# Patient Record
Sex: Male | Born: 1980 | Race: Black or African American | Hispanic: No | Marital: Single | Smoking: Never smoker
Health system: Southern US, Community
[De-identification: ages and names within clinical notes are randomized; demographics above are authoritative.]

## PROBLEM LIST (undated history)

## (undated) DIAGNOSIS — I480 Paroxysmal atrial fibrillation: Secondary | ICD-10-CM

## (undated) HISTORY — PX: ANTERIOR CRUCIATE LIGAMENT REPAIR: SHX115

---
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
## 2002-10-14 HISTORY — PX: ANTERIOR CRUCIATE LIGAMENT REPAIR: SHX115

## 2004-11-27 ENCOUNTER — Ambulatory Visit (HOSPITAL_BASED_OUTPATIENT_CLINIC_OR_DEPARTMENT_OTHER): Admission: RE | Admit: 2004-11-27 | Discharge: 2004-11-27 | Payer: Self-pay | Admitting: Urology

## 2004-11-30 ENCOUNTER — Ambulatory Visit (HOSPITAL_BASED_OUTPATIENT_CLINIC_OR_DEPARTMENT_OTHER): Admission: RE | Admit: 2004-11-30 | Discharge: 2004-11-30 | Payer: Self-pay | Admitting: Orthopedic Surgery

## 2012-11-30 ENCOUNTER — Encounter (HOSPITAL_COMMUNITY): Payer: Self-pay | Admitting: *Deleted

## 2012-11-30 ENCOUNTER — Emergency Department (HOSPITAL_COMMUNITY)
Admission: EM | Admit: 2012-11-30 | Discharge: 2012-11-30 | Disposition: A | Discharge status: Home / Self Care | Payer: Self-pay | Attending: Emergency Medicine | Admitting: Emergency Medicine

## 2012-11-30 ENCOUNTER — Emergency Department (HOSPITAL_COMMUNITY): Payer: Self-pay

## 2012-11-30 DIAGNOSIS — S91109A Unspecified open wound of unspecified toe(s) without damage to nail, initial encounter: Secondary | ICD-10-CM | POA: Insufficient documentation

## 2012-11-30 DIAGNOSIS — S97109A Crushing injury of unspecified toe(s), initial encounter: Secondary | ICD-10-CM | POA: Insufficient documentation

## 2012-11-30 DIAGNOSIS — W208XXA Other cause of strike by thrown, projected or falling object, initial encounter: Secondary | ICD-10-CM | POA: Insufficient documentation

## 2012-11-30 DIAGNOSIS — Y93B9 Activity, other involving muscle strengthening exercises: Secondary | ICD-10-CM | POA: Insufficient documentation

## 2012-11-30 DIAGNOSIS — S91209A Unspecified open wound of unspecified toe(s) with damage to nail, initial encounter: Secondary | ICD-10-CM

## 2012-11-30 DIAGNOSIS — Y9289 Other specified places as the place of occurrence of the external cause: Secondary | ICD-10-CM | POA: Insufficient documentation

## 2012-11-30 DIAGNOSIS — Z23 Encounter for immunization: Secondary | ICD-10-CM | POA: Insufficient documentation

## 2012-11-30 MED ORDER — TETANUS-DIPHTH-ACELL PERTUSSIS 5-2.5-18.5 LF-MCG/0.5 IM SUSP
0.5000 mL | Freq: Once | INTRAMUSCULAR | Status: AC
  Administered 2012-11-30: 0.5 mL via INTRAMUSCULAR
  Filled 2012-11-30: qty 0.5

## 2012-11-30 NOTE — ED Notes (Signed)
Patient is alert and orientedx4.  Patient was explained discharge instructions and they understood them with no questions.   

## 2012-11-30 NOTE — ED Notes (Signed)
Patient says he was at the gym working out and he dropped a weight on his foot.  The toe second to the great toe on the left foot is bleeding and the patient pulled the toenail off.  Patient has toenail with him.

## 2012-11-30 NOTE — ED Provider Notes (Signed)
History  This chart was scribed for Cornelia Copa, a non-physician practitioner working with Gwyneth Sprout, MD by Lewanda Rife, ED Scribe. This patient was seen in room [facility] and the patient's care was started at 2110.   CSN: [id number]  Arrival date & time 11/30/12  2050   First MD Initiated Contact with Patient 11/30/12 2103      Chief Complaint  Patient presents with  . Toe Injury    HPI Evan Vargas is a 32 y.o. male who presents to the Emergency Department complaining of second toe of left foot injury onset 30 minutes prior to arrival after dropping a weight on foot at the gym. Pt reports mild, constant throbbing pain to left second toe. Pt denies taking any medications at home to treat pain. Pt reports unknown tetanus status.   History reviewed. No pertinent past medical history.  Past Surgical History  Procedure Laterality Date  . Anterior cruciate ligament repair      left    History reviewed. No pertinent family history.  History  Substance Use Topics  . Smoking status: Never Smoker   . Smokeless tobacco: Not on file  . Alcohol Use: No      Review of Systems  All other systems reviewed and are negative.   A complete 10 system review of systems was obtained and all systems are negative except as noted in the HPI and PMH.   Allergies  Review of patient's allergies indicates no known allergies.  Home Medications  No current outpatient prescriptions on file.  BP 134/50  Pulse 101  Temp(Src) 98.9 F (37.2 C) (Oral)  Resp 16  SpO2 100%  Physical Exam  Nursing note and vitals reviewed. Constitutional: He is oriented to person, place, and time. He appears well-developed and well-nourished. No distress.  HENT:  Head: Normocephalic and atraumatic.  Eyes: Conjunctivae and EOM are normal.  Neck: Normal range of motion. Neck supple.  Cardiovascular: Normal rate, normal heart sounds and intact distal pulses.   Pulmonary/Chest: Effort  normal and breath sounds normal.  Musculoskeletal: Normal range of motion. He exhibits no edema and no tenderness.  Second toe of left foot with complete avulsion of the nail and mild bleeding. Normal sensation and ROM. No gross deformity.   Neurological: He is alert and oriented to person, place, and time. He has normal strength. No sensory deficit.  Skin: Skin is warm and dry. No rash noted.  Psychiatric: He has a normal mood and affect.    ED Course  Procedures   Medications  TDaP (BOOSTRIX) injection 0.5 mL (not administered)    Dg Toe 2nd Left  11/30/2012  *RADIOLOGY REPORT*  Clinical Data: Second toe injury.  LEFT SECOND TOE  Comparison: None.  Findings: There is soft tissue irregularity distally consistent with a laceration.  No foreign body, acute fracture or dislocation is identified.  IMPRESSION: No acute osseous findings.   Original Report Authenticated By: Carey Bullocks, M.D.      1. Nail avulsion of toe   2. Crush injury, toe       MDM  Patient seen and evaluated. Patient appears comfortable in no acute distress. There is obvious avulsion injury of the nail of the left second toe. No gross deformity.  Wound care and bandage provided. No fractures on x-ray. Patient informed he may not have regrowth of nail.    I personally performed the services described in this documentation, which was scribed in my presence. The recorded information has been reviewed  and is accurate.    Angus Seller, [place] 11/30/12 [phone]

## 2012-11-30 NOTE — ED Provider Notes (Signed)
Medical screening examination/treatment/procedure(s) were performed by non-physician practitioner and as supervising physician I was immediately available for consultation/collaboration.   Gwyneth Sprout, MD 11/30/12 2317

## 2012-11-30 NOTE — Progress Notes (Signed)
Orthopedic Tech Progress Note Patient Details:  Evan Vargas 1981/07/12 [id number]  Ortho Devices Type of Ortho Device: Postop shoe/boot Ortho Device/Splint Location: (L) LE Ortho Device/Splint Interventions: Application   Jennye Moccasin 11/30/2012, 10:15 PM

## 2012-11-30 NOTE — ED Notes (Signed)
Pt states dropped weight on left foot. Pt states that the toe nail next to his great toe on his left foot is missing, pt has it with him.

## 2018-01-02 ENCOUNTER — Encounter (HOSPITAL_COMMUNITY): Payer: Self-pay | Admitting: Emergency Medicine

## 2018-01-02 ENCOUNTER — Emergency Department (HOSPITAL_COMMUNITY): Payer: Self-pay

## 2018-01-02 ENCOUNTER — Inpatient Hospital Stay (HOSPITAL_COMMUNITY)
Admission: EM | Admit: 2018-01-02 | Discharge: 2018-01-05 | DRG: 193 | Disposition: A | Discharge status: Home / Self Care | Payer: Self-pay | Attending: Family Medicine | Admitting: Family Medicine

## 2018-01-02 ENCOUNTER — Inpatient Hospital Stay (HOSPITAL_COMMUNITY): Payer: Self-pay

## 2018-01-02 ENCOUNTER — Other Ambulatory Visit: Payer: Self-pay

## 2018-01-02 DIAGNOSIS — R0682 Tachypnea, not elsewhere classified: Secondary | ICD-10-CM | POA: Diagnosis present

## 2018-01-02 DIAGNOSIS — J9601 Acute respiratory failure with hypoxia: Secondary | ICD-10-CM | POA: Diagnosis present

## 2018-01-02 DIAGNOSIS — I313 Pericardial effusion (noninflammatory): Secondary | ICD-10-CM | POA: Diagnosis present

## 2018-01-02 DIAGNOSIS — J9602 Acute respiratory failure with hypercapnia: Secondary | ICD-10-CM | POA: Diagnosis present

## 2018-01-02 DIAGNOSIS — J810 Acute pulmonary edema: Secondary | ICD-10-CM

## 2018-01-02 DIAGNOSIS — I361 Nonrheumatic tricuspid (valve) insufficiency: Secondary | ICD-10-CM

## 2018-01-02 DIAGNOSIS — J9621 Acute and chronic respiratory failure with hypoxia: Secondary | ICD-10-CM | POA: Diagnosis present

## 2018-01-02 DIAGNOSIS — I340 Nonrheumatic mitral (valve) insufficiency: Secondary | ICD-10-CM

## 2018-01-02 DIAGNOSIS — R000 Tachycardia, unspecified: Secondary | ICD-10-CM | POA: Diagnosis present

## 2018-01-02 DIAGNOSIS — R0602 Shortness of breath: Secondary | ICD-10-CM | POA: Diagnosis present

## 2018-01-02 DIAGNOSIS — Z79899 Other long term (current) drug therapy: Secondary | ICD-10-CM

## 2018-01-02 DIAGNOSIS — I4892 Unspecified atrial flutter: Secondary | ICD-10-CM | POA: Diagnosis present

## 2018-01-02 DIAGNOSIS — J189 Pneumonia, unspecified organism: Principal | ICD-10-CM | POA: Diagnosis present

## 2018-01-02 DIAGNOSIS — I5031 Acute diastolic (congestive) heart failure: Secondary | ICD-10-CM | POA: Diagnosis present

## 2018-01-02 DIAGNOSIS — R7303 Prediabetes: Secondary | ICD-10-CM | POA: Diagnosis present

## 2018-01-02 DIAGNOSIS — R748 Abnormal levels of other serum enzymes: Secondary | ICD-10-CM

## 2018-01-02 LAB — TROPONIN I
Troponin I: 0.1 ng/mL (ref <0.03)
Troponin I: 0.09 ng/mL (ref <0.03)

## 2018-01-02 LAB — COMPREHENSIVE METABOLIC PANEL WITH GFR
ALT: 308 U/L — ABNORMAL HIGH (ref 17–63)
AST: 165 U/L — ABNORMAL HIGH (ref 15–41)
Albumin: 2.7 g/dL — ABNORMAL LOW (ref 3.5–5.0)
Alkaline Phosphatase: 68 U/L (ref 38–126)
Anion gap: 11 (ref 5–15)
BUN: 20 mg/dL (ref 6–20)
CO2: 23 mmol/L (ref 22–32)
Calcium: 7.7 mg/dL — ABNORMAL LOW (ref 8.9–10.3)
Chloride: 104 mmol/L (ref 101–111)
Creatinine, Ser: 1.24 mg/dL (ref 0.61–1.24)
GFR calc Af Amer: >60 mL/min
GFR calc non Af Amer: >60 mL/min
Glucose, Bld: 129 mg/dL — ABNORMAL HIGH (ref 65–99)
Potassium: 4.2 mmol/L (ref 3.5–5.1)
Sodium: 138 mmol/L (ref 135–145)
Total Bilirubin: 0.3 mg/dL (ref 0.3–1.2)
Total Protein: 5.7 g/dL — ABNORMAL LOW (ref 6.5–8.1)

## 2018-01-02 LAB — URINALYSIS, ROUTINE W REFLEX MICROSCOPIC
Bilirubin Urine: NEGATIVE
Glucose, UA: NEGATIVE mg/dL
Hgb urine dipstick: NEGATIVE
Ketones, ur: NEGATIVE mg/dL
Leukocytes, UA: NEGATIVE
NITRITE: NEGATIVE
PROTEIN: NEGATIVE mg/dL
SPECIFIC GRAVITY, URINE: 1.011 (ref 1.005–1.030)
pH: 7 (ref 5.0–8.0)

## 2018-01-02 LAB — LACTIC ACID, PLASMA: LACTIC ACID, VENOUS: 1.8 mmol/L (ref 0.5–1.9)

## 2018-01-02 LAB — CBC WITH DIFFERENTIAL/PLATELET
Basophils Absolute: 0 K/uL (ref 0.0–0.1)
Basophils Relative: 0 %
Eosinophils Absolute: 0 K/uL (ref 0.0–0.7)
Eosinophils Relative: 0 %
HCT: 38.2 % — ABNORMAL LOW (ref 39.0–52.0)
Hemoglobin: 12.8 g/dL — ABNORMAL LOW (ref 13.0–17.0)
Lymphocytes Relative: 14 %
Lymphs Abs: 1.1 K/uL (ref 0.7–4.0)
MCH: 30.8 pg (ref 26.0–34.0)
MCHC: 33.5 g/dL (ref 30.0–36.0)
MCV: 91.8 fL (ref 78.0–100.0)
Monocytes Absolute: 0.8 K/uL (ref 0.1–1.0)
Monocytes Relative: 10 %
Neutro Abs: 6 K/uL (ref 1.7–7.7)
Neutrophils Relative %: 76 %
Platelets: 188 K/uL (ref 150–400)
RBC: 4.16 MIL/uL — ABNORMAL LOW (ref 4.22–5.81)
RDW: 15 % (ref 11.5–15.5)
WBC: 7.8 K/uL (ref 4.0–10.5)

## 2018-01-02 LAB — I-STAT CG4 LACTIC ACID, ED
Lactic Acid, Venous: 3.05 mmol/L (ref 0.5–1.9)
Lactic Acid, Venous: 3.22 mmol/L (ref 0.5–1.9)

## 2018-01-02 LAB — CALCIUM: Calcium: 7.7 mg/dL — ABNORMAL LOW (ref 8.9–10.3)

## 2018-01-02 LAB — I-STAT ARTERIAL BLOOD GAS, ED
Acid-Base Excess: 5 mmol/L — ABNORMAL HIGH (ref 0.0–2.0)
Bicarbonate: 25.4 mmol/L (ref 20.0–28.0)
O2 Saturation: 99 %
TCO2: 26 mmol/L (ref 22–32)
pCO2 arterial: 26.3 mmHg — ABNORMAL LOW (ref 32.0–48.0)
pH, Arterial: 7.593 — ABNORMAL HIGH (ref 7.350–7.450)
pO2, Arterial: 121 mmHg — ABNORMAL HIGH (ref 83.0–108.0)

## 2018-01-02 LAB — MAGNESIUM: MAGNESIUM: 2.2 mg/dL (ref 1.7–2.4)

## 2018-01-02 LAB — PHOSPHORUS: PHOSPHORUS: 3 mg/dL (ref 2.5–4.6)

## 2018-01-02 LAB — CBC
HCT: 37.6 % — ABNORMAL LOW (ref 39.0–52.0)
HEMOGLOBIN: 12.5 g/dL — AB (ref 13.0–17.0)
MCH: 30.2 pg (ref 26.0–34.0)
MCHC: 33.2 g/dL (ref 30.0–36.0)
MCV: 90.8 fL (ref 78.0–100.0)
Platelets: 209 10*3/uL (ref 150–400)
RBC: 4.14 MIL/uL — AB (ref 4.22–5.81)
RDW: 14.9 % (ref 11.5–15.5)
WBC: 6.5 10*3/uL (ref 4.0–10.5)

## 2018-01-02 LAB — CREATININE, SERUM: Creatinine, Ser: 1.22 mg/dL (ref 0.61–1.24)

## 2018-01-02 LAB — ECHOCARDIOGRAM COMPLETE
HEIGHTINCHES: 68 in
WEIGHTICAEL: 2720 [oz_av]

## 2018-01-02 LAB — RAPID URINE DRUG SCREEN, HOSP PERFORMED
Amphetamines: NOT DETECTED
Barbiturates: NOT DETECTED
Benzodiazepines: NOT DETECTED
Cocaine: NOT DETECTED
Opiates: NOT DETECTED
Tetrahydrocannabinol: NOT DETECTED

## 2018-01-02 LAB — BRAIN NATRIURETIC PEPTIDE
B Natriuretic Peptide: 1008.6 pg/mL — ABNORMAL HIGH (ref 0.0–100.0)
B Natriuretic Peptide: 1059.7 pg/mL — ABNORMAL HIGH (ref 0.0–100.0)

## 2018-01-02 LAB — TSH: TSH: 0.511 u[IU]/mL (ref 0.350–4.500)

## 2018-01-02 LAB — I-STAT TROPONIN, ED: Troponin i, poc: 0.07 ng/mL (ref 0.00–0.08)

## 2018-01-02 LAB — INFLUENZA PANEL BY PCR (TYPE A & B)
INFLBPCR: NEGATIVE
Influenza A By PCR: NEGATIVE

## 2018-01-02 LAB — CK: CK TOTAL: 355 U/L (ref 49–397)

## 2018-01-02 LAB — CK TOTAL AND CKMB (NOT AT ARMC)
CK TOTAL: 335 U/L (ref 49–397)
CK, MB: 5.2 ng/mL — ABNORMAL HIGH (ref 0.5–5.0)
Relative Index: 1.6 (ref 0.0–2.5)

## 2018-01-02 LAB — HEMOGLOBIN A1C
Hgb A1c MFr Bld: 5.9 % — ABNORMAL HIGH (ref 4.8–5.6)
MEAN PLASMA GLUCOSE: 122.63 mg/dL

## 2018-01-02 MED ORDER — MAGNESIUM CITRATE PO SOLN: 1.0000 | Freq: Once | ORAL | Status: DC | PRN

## 2018-01-02 MED ORDER — SORBITOL 70 % SOLN
30.0000 mL | Freq: Every day | Status: DC | PRN
  Filled 2018-01-02: qty 30

## 2018-01-02 MED ORDER — ACETAMINOPHEN 325 MG PO TABS: 650.0000 mg | ORAL_TABLET | Freq: Four times a day (QID) | ORAL | Status: DC | PRN

## 2018-01-02 MED ORDER — KETOROLAC TROMETHAMINE 15 MG/ML IJ SOLN: 15.0000 mg | Freq: Four times a day (QID) | INTRAMUSCULAR | Status: DC | PRN

## 2018-01-02 MED ORDER — METOPROLOL TARTRATE 5 MG/5ML IV SOLN
5.0000 mg | Freq: Once | INTRAVENOUS | Status: AC
  Administered 2018-01-02: 5 mg via INTRAVENOUS
  Filled 2018-01-02: qty 5

## 2018-01-02 MED ORDER — CEFTRIAXONE SODIUM 1 G IJ SOLR
1.0000 g | INTRAMUSCULAR | Status: DC
  Administered 2018-01-03 – 2018-01-04 (×2): 1 g via INTRAVENOUS
  Filled 2018-01-02 (×3): qty 10

## 2018-01-02 MED ORDER — HYDROCODONE-ACETAMINOPHEN 5-325 MG PO TABS: 1.0000 | ORAL_TABLET | ORAL | Status: DC | PRN

## 2018-01-02 MED ORDER — ASPIRIN EC 81 MG PO TBEC
81.0000 mg | DELAYED_RELEASE_TABLET | Freq: Every day | ORAL | Status: DC
  Administered 2018-01-03: 81 mg via ORAL
  Filled 2018-01-02: qty 1

## 2018-01-02 MED ORDER — SODIUM CHLORIDE 0.9 % IV SOLN
500.0000 mg | Freq: Once | INTRAVENOUS | Status: DC
  Filled 2018-01-02: qty 500

## 2018-01-02 MED ORDER — FUROSEMIDE 10 MG/ML IJ SOLN
40.0000 mg | Freq: Once | INTRAMUSCULAR | Status: AC
  Administered 2018-01-02: 40 mg via INTRAVENOUS
  Filled 2018-01-02: qty 4

## 2018-01-02 MED ORDER — HEPARIN SODIUM (PORCINE) 5000 UNIT/ML IJ SOLN
5000.0000 [IU] | Freq: Three times a day (TID) | INTRAMUSCULAR | Status: DC
  Administered 2018-01-02 – 2018-01-03 (×2): 5000 [IU] via SUBCUTANEOUS
  Filled 2018-01-02 (×2): qty 1

## 2018-01-02 MED ORDER — FUROSEMIDE 10 MG/ML IJ SOLN
40.0000 mg | Freq: Two times a day (BID) | INTRAMUSCULAR | Status: DC
Start: 2018-01-02 — End: 2018-01-05
  Administered 2018-01-02 – 2018-01-04 (×5): 40 mg via INTRAVENOUS
  Filled 2018-01-02 (×5): qty 4

## 2018-01-02 MED ORDER — IOPAMIDOL (ISOVUE-370) INJECTION 76%
INTRAVENOUS | Status: AC
  Administered 2018-01-02: 81 mL
  Filled 2018-01-02: qty 100

## 2018-01-02 MED ORDER — ONDANSETRON HCL 4 MG/2ML IJ SOLN: 4.0000 mg | Freq: Four times a day (QID) | INTRAMUSCULAR | Status: DC | PRN

## 2018-01-02 MED ORDER — SODIUM CHLORIDE 0.9 % IV SOLN
1.0000 g | Freq: Once | INTRAVENOUS | Status: AC
  Administered 2018-01-02: 1 g via INTRAVENOUS
  Filled 2018-01-02: qty 10

## 2018-01-02 MED ORDER — ACETAMINOPHEN 650 MG RE SUPP: 650.0000 mg | Freq: Four times a day (QID) | RECTAL | Status: DC | PRN

## 2018-01-02 MED ORDER — LEVALBUTEROL HCL 0.63 MG/3ML IN NEBU: 0.6300 mg | INHALATION_SOLUTION | Freq: Four times a day (QID) | RESPIRATORY_TRACT | Status: DC | PRN

## 2018-01-02 MED ORDER — AZITHROMYCIN 500 MG PO TABS
500.0000 mg | ORAL_TABLET | Freq: Every day | ORAL | Status: DC
  Administered 2018-01-03 – 2018-01-04 (×2): 500 mg via ORAL
  Filled 2018-01-02 (×3): qty 1

## 2018-01-02 MED ORDER — ONDANSETRON HCL 4 MG PO TABS: 4.0000 mg | ORAL_TABLET | Freq: Four times a day (QID) | ORAL | Status: DC | PRN

## 2018-01-02 MED ORDER — ALBUTEROL SULFATE (2.5 MG/3ML) 0.083% IN NEBU: 2.5000 mg | INHALATION_SOLUTION | RESPIRATORY_TRACT | Status: DC | PRN

## 2018-01-02 MED ORDER — DOCUSATE SODIUM 100 MG PO CAPS
100.0000 mg | ORAL_CAPSULE | Freq: Two times a day (BID) | ORAL | Status: DC
  Administered 2018-01-03 – 2018-01-05 (×2): 100 mg via ORAL
  Filled 2018-01-02 (×5): qty 1

## 2018-01-02 MED ORDER — SENNOSIDES-DOCUSATE SODIUM 8.6-50 MG PO TABS: 1.0000 | ORAL_TABLET | Freq: Every evening | ORAL | Status: DC | PRN

## 2018-01-02 NOTE — Progress Notes (Signed)
  Echocardiogram 2D Echocardiogram has been performed.  Roosvelt MaserLane, Divit Stipp F 01/02/2018, 5:18 PM

## 2018-01-02 NOTE — ED Provider Notes (Signed)
Medical screening examination/treatment/procedure(s) were conducted as a shared visit with non-physician practitioner(s) and myself.  I personally evaluated the patient during the encounter.  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  Afterwards he started eaten and drinking as he normally would no alcohol, tobacco, illegal drugs or over-the-counter medications.  No anabolic steroids during his build or cut phases.  Over the last few days he had progressively worsening shortness of breath and orthopnea and cough.  No fevers.  No productive cough.  Is also noticed facial, arm and leg swelling.  His girlfriend states that she thought 1 day his left leg was quite a bit bigger than his right.  No rashes.  Has been urinating normally no diarrhea or constipation nor nausea or vomiting. My exam patient is in moderate respiratory distress even on 5 L nasal cannula he is having difficulty speaking in full sentences and his oxygen saturations dropped as low as 87% with a good waveform.  His blood pressure is fine but his heart rate is steadily at 125-130 range and he is breathing anywhere from 35-45 times a minute.  States his body is felt for the last couple days.  He has edema of his hands, back and his face. Secondary to respiratory distress, will start BiPaP. Obviously one concern for this patient would be renal failure secondary to dehydration or something along those lines with his recent cut however he says has been urinating normally making that a little bit less likely.  Could also be heart failure from an infectious cause or it could be high output heart failure although no immediate causes for that.  No recent infections to suggest possible myocarditis.  Another consideration would be pulmonary embolus causing pulmonary hypertension and fluid backup. Plan will be to check his labs and if no immediate cause will CT him.  We will hold on  any interventions at this point.  reeval after Bipap for about half an hour and his respiratory status is improved significantly.  He is oxygenating well and his respiratory rate is improved as is his comfort.  BMP shows a elevation however services troponin mildly.  Is no evidence of renal failure.  CRITICAL CARE Performed by: Marily MemosMesner, Laveyah Oriol Total critical care time: 35 minutes Critical care time was exclusive of separately billable procedures and treating other patients. Critical care was necessary to treat or prevent imminent or life-threatening deterioration. Critical care was time spent personally by me on the following activities: development of treatment plan with patient and/or surrogate as well as nursing, discussions with consultants, evaluation of patient's response to treatment, examination of patient, obtaining history from patient or surrogate, ordering and performing treatments and interventions, ordering and review of laboratory studies, ordering and review of radiographic studies, pulse oximetry and re-evaluation of patient's condition.    EKG Interpretation  Date/Time:  Friday January 02 2018 12:22:04 EDT Ventricular Rate:  131 PR Interval:    QRS Duration: 69 QT Interval:  307 QTC Calculation: 454 R Axis:   83 Text Interpretation:  Junctional tachycardia Borderline T abnormalities, lateral leads No old tracing to compare Confirmed by Marily MemosMesner, Lochlan Grygiel [phone]) on 01/02/2018 1:42:15 PM        Chera Slivka, Barbara CowerJason, MD 01/02/18 1708

## 2018-01-02 NOTE — ED Triage Notes (Signed)
Patient transported from Urgent Care via [facility] for shortness of breath. Patient given 60mg  prednisone and 5mg  albuterol PTA at urgent care. Patient arrives on 3L , does not wear O2 at baseline, reported room air saturation of 60-80%. Patient alert and oriented and in no apparent distress at this time.

## 2018-01-02 NOTE — Progress Notes (Signed)
CSW received consult. CSW went by pt's room. Pt was being seen by medical providers at this time.  CSW will check back with pt.   Montine CircleKelsy Jenness Stemler, Silverio LayLCSWA [facility]  [phone]

## 2018-01-02 NOTE — ED Provider Notes (Signed)
[facility]   CSN: [id number] Arrival date & time: 01/02/18  1209     History   Chief Complaint Chief Complaint  Patient presents with  . Shortness of Breath    HPI Evan Vargas is a 37 y.o. male.  HPI Evan Vargas is a 37 y.o. male otherwise healthy, presents to emergency department with complaint of swelling, shortness of breath, fever.  Patient states he has not been feeling well over the last 3 days.  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  He states during the cut, he had to dehydrate himself and since then has been eating normally.  He states he is only natural supplements, he did not use any steroids or prescription diuretics.  He states that he has gradually developed shortness of breath and feels like his chest is crackly.  He admits to difficulty breathing when laying down and on exertion.  He has noticed that today he felt warm and had a subjective fever.  He denies any chest pain.  He states he also noticed that his hands and feet are swelling.  He has never had similar symptoms in the past.  He denies any recent travel or surgeries.  He denies any other complaints.  History reviewed. No pertinent past medical history.  There are no active problems to display for this patient.   Past Surgical History:  Procedure Laterality Date  . ANTERIOR CRUCIATE LIGAMENT REPAIR     left  . ANTERIOR CRUCIATE LIGAMENT REPAIR  2004       Home Medications    Prior to Admission medications   Not on File    Family History No family history on file.  Social History Social History   Tobacco Use  . Smoking status: Never Smoker  . Smokeless tobacco: Never Used  Substance Use Topics  . Alcohol use: No  . Drug use: No     Allergies   Patient has no known allergies.   Review of Systems Review of Systems  Constitutional: Positive for chills and fever.  Respiratory:  Positive for cough, chest tightness and shortness of breath.   Cardiovascular: Positive for leg swelling. Negative for chest pain and palpitations.  Gastrointestinal: Negative for abdominal distention, abdominal pain, diarrhea, nausea and vomiting.  Genitourinary: Negative for dysuria, frequency, hematuria and urgency.  Musculoskeletal: Negative for arthralgias, myalgias, neck pain and neck stiffness.  Skin: Negative for rash.  Allergic/Immunologic: Negative for immunocompromised state.  Neurological: Negative for dizziness, weakness, light-headedness, numbness and headaches.  All other systems reviewed and are negative.    Physical Exam Updated Vital Signs BP (!) 143/73 (BP Location: Right Arm)   Pulse (!) 129   Temp 98.7 F (37.1 C) (Oral)   Resp (!) 28   Ht 5\' 8"  (1.727 m)   Wt 77.1 kg (170 lb)   SpO2 90%   BMI 25.85 kg/m   Physical Exam  Constitutional: He appears well-developed and well-nourished. No distress.  HENT:  Head: Normocephalic and atraumatic.  Eyes: Conjunctivae are normal.  Neck: Neck supple.  Cardiovascular: Normal rate, regular rhythm and normal heart sounds.  Pulmonary/Chest: Effort normal. No respiratory distress. He has no wheezes. He has rales in the right lower field and the left lower field.  Abdominal: Soft. Bowel sounds are normal. He exhibits no distension. There is no tenderness. There is no rebound.  Musculoskeletal: He exhibits no edema.  Nonpitting edema to  bilateral upper and lower extremities  Neurological: He is alert.  Skin: Skin is warm and dry.  Nursing note and vitals reviewed.    ED Treatments / Results  Labs (all labs ordered are listed, but only abnormal results are displayed) Labs Reviewed  CBC WITH DIFFERENTIAL/PLATELET  COMPREHENSIVE METABOLIC PANEL  BRAIN NATRIURETIC PEPTIDE  INFLUENZA PANEL BY PCR (TYPE A & B)  CK  I-STAT CG4 LACTIC ACID, ED  I-STAT TROPONIN, ED    EKG  EKG Interpretation None        Radiology Dg Chest 2 View  Result Date: 01/02/2018 CLINICAL DATA:  Short of breath and chest pain EXAM: CHEST - 2 VIEW COMPARISON:  None. FINDINGS: Bilateral perihilar airspace disease is symmetric and most consistent with edema. Small pleural effusions. Heart size upper normal. Mediastinal contours normal. No mass lesion. IMPRESSION: Perihilar airspace disease bilaterally most likely edema. Electronically Signed   By: Marlan Palau M.D.   On: 01/02/2018 12:43    Procedures Procedures (including critical care time)  Medications Ordered in ED Medications - No data to display   Initial Impression / Assessment and Plan / ED Course  I have reviewed the triage vital signs and the nursing notes.  Pertinent labs & imaging results that were available during my care of the patient were reviewed by me and considered in my medical decision making (see chart for details).     Pt in acute respiratory distress, hypoxic apparently into 60-80 on room air at UC, dropping into 80s even now while speaking on 5L.  He does have nonpitting edema in bilateral upper extremities and around his face.  He has rales at bases on lung exam.  Concerning for possible heart failure versus kidney failure versus even pulmonary embolism or infectious process given that he said he had fever earlier.  He is afebrile here.  Will check labs, chest x-ray, monitor closely.  Because of persistent hypoxia even on 5 L nasal cannula, will start on BiPAP.  Patient seen by Dr. Clayborne Dana as well.   Normal WBC, normal CK, troponin is 0.07.  Slightly bumped lactic acid of 3.  Bumped LFTs.  Low protein and albumin as well as calcium, could be responsible for some of his swelling.  BNP is 1008.  CT angios ordered to make sure that he does not have a pulmonary embolism.  Patient is persistently tachycardic, however blood pressure is normal.   3:10 PM CT angios negative for pulmonary embolism, however shows pulmonary edema versus  atypical pneumonia and small bilateral pleural effusions.  I will start him with antibiotic to cover for possible pneumonia we will try Lasix to start diuresing him.  He appears to be more comfortable on BiPAP.  Oxygen saturation on my evaluation was 98%, however he continues to be tachycardic.  3:32 PM Spoke with hospitalist, will admit. Asked for stat echo, asked for drug screen and abg. Orders placed.   Vitals:   01/02/18 1224 01/02/18 1300 01/02/18 1315 01/02/18 1400  BP:  123/83 (!) 129/97 (!) 128/94  Pulse:  (!) 130 (!) 131 (!) 128  Resp:   (!) 35 (!) 27  Temp:      TempSrc:      SpO2:  91% 90% 98%  Weight: 77.1 kg (170 lb)     Height: 5\' 8"  (1.727 m)        Final Clinical Impressions(s) / ED Diagnoses   Final diagnoses:  Acute pulmonary edema (HCC)  Elevated liver enzymes  Tachycardia  ED Discharge Orders    None       Jaynie Crumble, [place] 01/02/18 1536    Mesner, Barbara Cower, MD 01/02/18 [phone]

## 2018-01-02 NOTE — Progress Notes (Signed)
Received from ED to room 4E01 alert and oriented on Bipap tolerating ok. Wants to eat. Unable at this time. Connected to telemetry. Oriented pt to room. Marya LandryAdrian Eila Runyan, RN

## 2018-01-02 NOTE — H&P (Signed)
History and Physical   Patient: Evan Vargas     PCP: Default, Provider, MD                    DOB: 1981-06-02            DOA: 01/02/2018 [id number]:[id number]             DOS: 01/02/2018, 4:33 PM  Patient coming from: Home  I have personally reviewed patient's medical records, in electronic medical records, including: [facility] link, and care everywhere.  ----------------------------------------------------------------------------------------------------------------------  Chief Complaint:  Chief Complaint  Patient presents with  . Shortness of Breath    HPI: Evan Vargas is a 37 y.o. male with medical history significant of shortness of breath. This is a 37 year old African-American male otherwise healthy presenting with acute onset of shortness of breath, subjective fever in the past 24 hours progressively getting worse.  generalized weaknesses.  Patient and his fiance at the bedside patient has not been feeling well the past 3 days.  Just finished a dietary regimen for body building competition.  Denies of any use or abuse of steroids, or any other enhancing drugs for body building. He reports doing a ladder regimen he has been well hydrated has returned back to his carb diet.  He denies any excessive supplement of caffeine intake.  He has been gradually feeling worse including shortness of breath, difficulty breathing on exertion. She was initially evaluated in urgent care, where a dose of steroid was given,  Currently he is on BiPAP, as patient was noted to be hypoxic upon arrival in ED.  But he denies any chest pain. Subjective fever.  Denies any headaches visual change asymmetric weaknesses.    Denies any rash.  Denies any dysuria.  Denies of any nausea vomiting diarrhea or constipation.  Denies any abdominal pain.  Denies any joint pain.   ED Course:   In ED patient was found tachycardic with a heart rate of 130, tachypneic respiratory rate of 32 blood pressure was stable at 128/94,  initially was mildly hypoxic on room air satting 90% on BiPAP now 98% Labs revealed elevated LFTs, elevated proBNP, influenza screen negative, normal WBC, lactic acid elevated to 3.22 CT of the chest negative for PE, Small BILATERAL pleural effusions with patchy BILATERAL airspace Infiltrates centrally throughout both lungs question pulmonary edema versus multifocal pneumonia  Review of Systems: As per HPI otherwise 12 point review of systems negative.  ---------------------------------------------------------------------------------------------------------------------  Principal Problem:   Acute respiratory failure with hypercapnia (HCC) Active Problems:   SOB (shortness of breath)   Tachycardia   Tachypnea   Community acquired pneumonia  Assessment/Plan: Principal Problem:   Acute respiratory failure with hypercapnia  -Patient will be admitted to monitor bed -We will continue the patient on BiPAP, will follow with ABGs, DuoNeb bronchodilator treatments, -Per CTA of the chest negative for PE, possible pneumonia, blood cultures been obtained, will continue antibiotics of azithromycin and Rocephin -We will proceed with labs, proBNP, daily weight I's and O's, and 2D echocardiogram To rule out acute heart failure  Ruling out sepsis -Negative for any fever at this time, negative for any leukocytosis, elevated lactic acid, still tachycardic, tachypneic, on BiPAP -We will continue to monitor closely  Intense bodybuilding -The patient and the fianc has been advised strictly, dietary supplements -They deny of any ancillary drugs or supplement aside for creatinine and strict dieting -Rule out rhabdomyolysis   DVT prophylaxis:  Heparin [place],  SCDs / compression stockings  Code Status:         Full code  Family Communication:  The above findings and plan of care has been discussed with patient and family in detail, they expressed understanding and agreement of above plan.     Disposition Plan:  1-2 days,            Home   Consults called:  None    Admission status:  Patient will be admitted as an inpatient, anticipating greater than 2 midnight length of stay.    Tele  floor  ----------------------------------------------------------------------------------------------------------------------  No Known Allergies  Home MEDs:  Prior to Admission medications   Medication Sig Start Date End Date Taking? Authorizing Provider  CREATINE PO Take 1 capsule by mouth daily.   Yes [provider]  DM-Doxylamine-Acetaminophen (NYQUIL COLD & FLU PO) Take 30 mLs by mouth every 6 (six) hours as needed (cold/flu symptoms).   Yes [provider]  GLUCOSAMINE-CHONDROITIN PO Take 1 tablet by mouth 2 (two) times daily.   Yes [provider]  GLUTAMINE PO Take 1 capsule by mouth daily.   Yes [provider]  ibuprofen (ADVIL,MOTRIN) 200 MG tablet Take 600 mg by mouth every 6 (six) hours as needed for fever (pain).   Yes [provider]  MAGNESIUM PO Take 1 tablet by mouth daily.   Yes [provider]  Multiple Vitamin (MULTIVITAMIN WITH MINERALS) TABS tablet Take 1 tablet by mouth daily.   Yes [provider]  Multiple Vitamins-Minerals (ZINC PO) Take 1 tablet by mouth daily.   Yes [provider]  OVER THE COUNTER MEDICATION Take 1 Scoop by mouth daily. BCAA powder   Yes [provider]    PRN MEDs: albuterol, HYDROcodone-acetaminophen, ketorolac, levalbuterol, magnesium citrate, ondansetron **OR** ondansetron (ZOFRAN) IV, senna-docusate, sorbitol  History reviewed. No pertinent past medical history.  Past Surgical History:  Procedure Laterality Date  . ANTERIOR CRUCIATE LIGAMENT REPAIR     left  . ANTERIOR CRUCIATE LIGAMENT REPAIR  2004     reports that he has never smoked. He has never used smokeless tobacco. He reports that he does not drink alcohol or use drugs.   No family  history on file.  Physical Exam: Vitals:   01/02/18 1315 01/02/18 1400 01/02/18 1553 01/02/18 1632  BP: (!) 129/97 (!) 128/94    Pulse: (!) 131 (!) 128  (!) 130  Resp: (!) 35 (!) 27  (!) 32  Temp:   99.5 F (37.5 C)   TempSrc:   Rectal   SpO2: 90% 98%  96%  Weight:      Height:        Constitutional: NAD, calm, comfortable Vitals:   01/02/18 1315 01/02/18 1400 01/02/18 1553 01/02/18 1632  BP: (!) 129/97 (!) 128/94    Pulse: (!) 131 (!) 128  (!) 130  Resp: (!) 35 (!) 27  (!) 32  Temp:   99.5 F (37.5 C)   TempSrc:   Rectal   SpO2: 90% 98%  96%  Weight:      Height:       Appearance: Patient is a mild to moderate acute distress on BiPAP, Eyes: PERRL, lids and conjunctivae normal ENMT: Mucous membranes are moist. Posterior pharynx clear of any exudate or lesions.Normal dentition.  Neck: normal, supple, no masses, no thyromegaly Respiratory: Mild diffuse wheezing, rales, negative for any significant crackles only mildly appreciated in the lower lung fields  cardiovascular: Regular rate and rhythm, no murmurs / rubs / gallops. No  extremity edema. 2+ pedal pulses. No carotid bruits.  Abdomen: no tenderness, no masses palpated. No hepatosplenomegaly. Bowel sounds positive.  Musculoskeletal: no clubbing / cyanosis. No joint deformity upper and lower extremities. Good ROM, no contractures. Normal muscle tone.  Neurologic: CN II-XII grossly intact. Sensation intact, DTR normal. Strength 5/5 in all 4.  Psychiatric: Normal judgment and insight. Alert and oriented x 3. Normal mood.  Skin: no rashes, lesions, ulcers. No induration  Labs on Admission: I have personally reviewed following labs and imaging studies  CBC: Recent Labs  Lab 01/02/18 1311  WBC 7.8  NEUTROABS 6.0  HGB 12.8*  HCT 38.2*  MCV 91.8  PLT 188   Basic Metabolic Panel: Recent Labs  Lab 01/02/18 1311  NA 138  K 4.2  CL 104  CO2 23  GLUCOSE 129*  BUN 20  CREATININE 1.24  CALCIUM 7.7*    GFR: Estimated Creatinine Clearance: 80.4 mL/min (by C-G formula based on SCr of 1.24 mg/dL). Liver Function Tests: Recent Labs  Lab 01/02/18 1311  AST 165*  ALT 308*  ALKPHOS 68  BILITOT 0.3  PROT 5.7*  ALBUMIN 2.7*   No results for input(s): LIPASE, AMYLASE in the last 168 hours. No results for input(s): AMMONIA in the last 168 hours. Coagulation Profile: No results for input(s): INR, PROTIME in the last 168 hours. Cardiac Enzymes: Recent Labs  Lab 01/02/18 1312  CKTOTAL 355    Radiological Exams on Admission: Dg Chest 2 View  Result Date: 01/02/2018 CLINICAL DATA:  Short of breath and chest pain EXAM: CHEST - 2 VIEW COMPARISON:  None. FINDINGS: Bilateral perihilar airspace disease is symmetric and most consistent with edema. Small pleural effusions. Heart size upper normal. Mediastinal contours normal. No mass lesion. IMPRESSION: Perihilar airspace disease bilaterally most likely edema. Electronically Signed   By: Marlan Palau M.D.   On: 01/02/2018 12:43   Ct Angio Chest Pe W And/or Wo Contrast  Result Date: 01/02/2018 CLINICAL DATA:  Shortness of breath, recently completed body building competition EXAM: CT ANGIOGRAPHY CHEST WITH CONTRAST TECHNIQUE: Multidetector CT imaging of the chest was performed using the standard protocol during bolus administration of intravenous contrast. Multiplanar CT image reconstructions and MIPs were obtained to evaluate the vascular anatomy. CONTRAST:  81mL ISOVUE-370 IOPAMIDOL (ISOVUE-370) INJECTION 76% IV COMPARISON:  None FINDINGS: Cardiovascular: Aorta normal caliber without aneurysm or dissection. No pericardial effusion. Pulmonary arteries adequately opacified and patent. Respiratory motion artifacts at the lower lobes. No evidence of pulmonary embolism. Mediastinum/Nodes: No thoracic adenopathy. Esophagus unremarkable. Base of cervical region normal appearance. Lungs/Pleura: Patchy airspace infiltrates identified throughout all lobes  question edema versus multifocal pneumonia. Small BILATERAL pleural effusions. No pneumothorax. No endobronchial lesions. Upper Abdomen: Unremarkable Musculoskeletal: Normal appearance Review of the MIP images confirms the above findings. IMPRESSION: No evidence pulmonary embolism. Small BILATERAL pleural effusions with patchy BILATERAL airspace infiltrates centrally throughout both lungs question pulmonary edema versus multifocal pneumonia. Electronically Signed   By: Ulyses Southward M.D.   On: 01/02/2018 14:53    EKG: Independently reviewed. **  Orders placed or performed during the hospital encounter of 01/02/18  . EKG 12-Lead  . EKG 12-Lead  . EKG 12-Lead  . EKG 12-Lead  . EKG 12-Lead     Time spent: > then  40 Min.   Kendell Bane MD [facility] ,  Pager [phone]  If 7PM-7AM, please contact night-coverage [email or web address]  Password [facility] 01/02/2018, 4:33 PM

## 2018-01-02 NOTE — Progress Notes (Addendum)
Pt inquiring about BiPap, requesting it off. Pt is not in respiratory distress, O2 sats 100. Paged [facility] to change bipap to PRN order, spoke w/ RT and placed pt on 2L [place]. In total pt was on Bipap for 6 hours. Pt tolerating well. No other c/o at this time. Will continue to monitor.  Margarito LinerStephanie M Weber Monnier, RN

## 2018-01-02 NOTE — Progress Notes (Signed)
CRITICAL VALUE STICKER  CRITICAL VALUE: Troponin 0.10  RECEIVER (on-site recipient of call):Livian Vanderbeck, RN  DATE & TIME NOTIFIED: 01/02/2018 1812  MESSENGER (representative from lab):lab tech  MD NOTIFIED: Dr. Flossie DibbleShahmehdi  TIME OF NOTIFICATION:01/02/2018 [phone]

## 2018-01-03 ENCOUNTER — Encounter (HOSPITAL_COMMUNITY): Payer: Self-pay | Admitting: Physician Assistant

## 2018-01-03 ENCOUNTER — Other Ambulatory Visit: Payer: Self-pay

## 2018-01-03 DIAGNOSIS — I5031 Acute diastolic (congestive) heart failure: Secondary | ICD-10-CM | POA: Diagnosis present

## 2018-01-03 DIAGNOSIS — J9601 Acute respiratory failure with hypoxia: Secondary | ICD-10-CM | POA: Diagnosis present

## 2018-01-03 DIAGNOSIS — J9621 Acute and chronic respiratory failure with hypoxia: Secondary | ICD-10-CM | POA: Diagnosis present

## 2018-01-03 DIAGNOSIS — R000 Tachycardia, unspecified: Secondary | ICD-10-CM

## 2018-01-03 LAB — CBC
HCT: 37.4 % — ABNORMAL LOW (ref 39.0–52.0)
HEMOGLOBIN: 12.4 g/dL — AB (ref 13.0–17.0)
MCH: 30.2 pg (ref 26.0–34.0)
MCHC: 33.2 g/dL (ref 30.0–36.0)
MCV: 91 fL (ref 78.0–100.0)
PLATELETS: 228 10*3/uL (ref 150–400)
RBC: 4.11 MIL/uL — AB (ref 4.22–5.81)
RDW: 15.1 % (ref 11.5–15.5)
WBC: 8.5 10*3/uL (ref 4.0–10.5)

## 2018-01-03 LAB — TROPONIN I: TROPONIN I: 0.08 ng/mL — AB (ref <0.03)

## 2018-01-03 LAB — HIV ANTIBODY (ROUTINE TESTING W REFLEX): HIV Screen 4th Generation wRfx: NONREACTIVE

## 2018-01-03 LAB — URINE CULTURE

## 2018-01-03 LAB — HEPATITIS PANEL, ACUTE
Hep A IgM: NEGATIVE
Hep B C IgM: NEGATIVE
Hepatitis B Surface Ag: NEGATIVE

## 2018-01-03 LAB — GLUCOSE, CAPILLARY: Glucose-Capillary: 115 mg/dL — ABNORMAL HIGH (ref 65–99)

## 2018-01-03 LAB — PROTIME-INR
INR: 1.06
PROTHROMBIN TIME: 13.7 s (ref 11.4–15.2)

## 2018-01-03 LAB — APTT: aPTT: 28 seconds (ref 24–36)

## 2018-01-03 MED ORDER — APIXABAN 5 MG PO TABS
5.0000 mg | ORAL_TABLET | Freq: Two times a day (BID) | ORAL | Status: DC
  Administered 2018-01-03 – 2018-01-05 (×5): 5 mg via ORAL
  Filled 2018-01-03 (×5): qty 1

## 2018-01-03 MED ORDER — METOPROLOL TARTRATE 12.5 MG HALF TABLET: 12.5000 mg | ORAL_TABLET | Freq: Two times a day (BID) | ORAL | Status: DC

## 2018-01-03 MED ORDER — ACETAMINOPHEN 325 MG PO TABS: 325.0000 mg | ORAL_TABLET | Freq: Four times a day (QID) | ORAL | Status: DC | PRN

## 2018-01-03 MED ORDER — METOPROLOL TARTRATE 50 MG PO TABS
50.0000 mg | ORAL_TABLET | Freq: Two times a day (BID) | ORAL | Status: DC
  Filled 2018-01-03: qty 1

## 2018-01-03 MED ORDER — METOPROLOL TARTRATE 12.5 MG HALF TABLET
12.5000 mg | ORAL_TABLET | Freq: Two times a day (BID) | ORAL | Status: DC
  Administered 2018-01-03 (×2): 12.5 mg via ORAL
  Filled 2018-01-03 (×3): qty 1

## 2018-01-03 NOTE — Progress Notes (Signed)
SLP Cancellation Note  Patient Details Name: Evan Vargas MRN: [id number] DOB: 05-02-1981   Cancelled treatment:       Reason Eval/Treat Not Completed: SLP screened, no needs identified, will sign off. Per chart review, discussion with RN and pt, do not find risk factors for dysphagia or swallowing complaints warranting full evaluation. Pt denies coughing or choking with meals or difficulties swallowing. Will s/o; please reconsult if necessary.  Rondel BatonMary Beth Anatalia Vargas, TennesseeMS, CCC-SLP Speech-Language Pathologist [phone]   [name] 01/03/2018, 2:51 PM

## 2018-01-03 NOTE — Progress Notes (Signed)
Contacted RuthBhagat, LomiraBhavinkumar, GeorgiaPA (cardiology) about pt's hypotension (bp 94/72) and asked about adjusting his metroprolol dose, 50 mg bid.  Per Manson PasseyBhagat, Bhavinkumar, PA (cardiology) dose was adjusted to 12.5 mg metoprolol bid with parameters to hold for a systolic bp < 90.

## 2018-01-03 NOTE — Consult Note (Signed)
CARDIOLOGY CONSULT NOTE  Unable to find standard hcconsult templet Patient ID: Evan BeganJorral Podolak MRN: [id number] DOB/AGE: Feb 18, 1981 37 y.o.  Admit date: 01/02/2018  Primary Physician   Default, Provider, MD Primary Cardiologist   Dr. Ellis ParentsNew to Dr. Eden EmmsNishan   HPI: Evan Vargas is a 37 y.o. male with no significant medical history who is being seen today for the evaluation of acute CHF at the request of Dr. Roselee NovaHangalgi.   The patient was in usual state of health up until 2 days ago prior to presentation when he started to having shortness of breath.  Progressively worsened.  Initially it was with exertion then it progressed at rest as well.  His dyspnea exacerbated by any movement.  He also had cough, chills and high temp (did not measured but felt warm).  He and does orthopnea but no PND.  He also had intermittent heart racing and dizziness with standing up.  He denies any chest pain, syncope, melena or blood in the stool or urine.  No prior similar episode.  The patient is a Systems analystpersonal trainer for body building.  He is on special diet of high protein.  Whenever He eats carbohydrate or salt he gets lower extremity edema.  Denies tobacco smoking, alcohol abuse or illicit drug use.  No prior cardiac history.  Family history only significant for maternal grandmother had some heart issue.  Unable to provide any further information.  In ER patient noted hypoxic requiring BiPAP.  He was tachycardic and hypotensive.  CT angiogram of the chest did not showed pulmonary embolism however concerning for pulmonary edema versus pneumonia.  Patient is treated with antibiotic and admitted for further treatment and evaluation.  BNP elevated to 1000.  Started on IV Lasix but not on strict I&O.  His dyspnea has improved significantly since admission.  Echocardiogram showed vigorous left ventricular function of 65-70%, severe concentric hypertrophy, mild mitral regurgitation, severely dilated bilateral atrium.  Small  pericardial effusion without evidence of tamponade.   History reviewed. No pertinent past medical history.   Past Surgical History:  Procedure Laterality Date  . ANTERIOR CRUCIATE LIGAMENT REPAIR     left  . ANTERIOR CRUCIATE LIGAMENT REPAIR  2004    No Known Allergies  I have reviewed the patient's current medications . aspirin EC  81 mg Oral Daily  . azithromycin  500 mg Oral Q1500  . docusate sodium  100 mg Oral BID  . furosemide  40 mg Intravenous BID  . heparin  5,000 Units Subcutaneous Q8H   . cefTRIAXone (ROCEPHIN)  IV     albuterol, HYDROcodone-acetaminophen, ketorolac, levalbuterol, magnesium citrate, ondansetron **OR** ondansetron (ZOFRAN) IV, senna-docusate, sorbitol  Prior to Admission medications   Medication Sig Start Date End Date Taking? Authorizing Provider  CREATINE PO Take 1 capsule by mouth daily.   Yes [provider]  DM-Doxylamine-Acetaminophen (NYQUIL COLD & FLU PO) Take 30 mLs by mouth every 6 (six) hours as needed (cold/flu symptoms).   Yes [provider]  GLUCOSAMINE-CHONDROITIN PO Take 1 tablet by mouth 2 (two) times daily.   Yes [provider]  GLUTAMINE PO Take 1 capsule by mouth daily.   Yes [provider]  ibuprofen (ADVIL,MOTRIN) 200 MG tablet Take 600 mg by mouth every 6 (six) hours as needed for fever (pain).   Yes [provider]  MAGNESIUM PO Take 1 tablet by mouth daily.   Yes [provider]  Multiple Vitamin (MULTIVITAMIN WITH MINERALS) TABS tablet Take 1 tablet by mouth  daily.   Yes [provider]  Multiple Vitamins-Minerals (ZINC PO) Take 1 tablet by mouth daily.   Yes [provider]  OVER THE COUNTER MEDICATION Take 1 Scoop by mouth daily. BCAA powder   Yes [provider]     Social History   Socioeconomic History  . Marital status: Single    Spouse name: Not on file  . Number of children: Not on file  . Years of education: Not on file  .  Highest education level: Not on file  Occupational History  . Not on file  Social Needs  . Financial resource strain: Not on file  . Food insecurity:    Worry: Not on file    Inability: Not on file  . Transportation needs:    Medical: Not on file    Non-medical: Not on file  Tobacco Use  . Smoking status: Never Smoker  . Smokeless tobacco: Never Used  Substance and Sexual Activity  . Alcohol use: No  . Drug use: No  . Sexual activity: Not on file  Lifestyle  . Physical activity:    Days per week: Not on file    Minutes per session: Not on file  . Stress: Not on file  Relationships  . Social connections:    Talks on phone: Not on file    Gets together: Not on file    Attends religious service: Not on file    Active member of club or organization: Not on file    Attends meetings of clubs or organizations: Not on file    Relationship status: Not on file  . Intimate partner violence:    Fear of current or ex partner: Not on file    Emotionally abused: Not on file    Physically abused: Not on file    Forced sexual activity: Not on file  Other Topics Concern  . Not on file  Social History Narrative  . Not on file    Family Status  Relation Name Status  . MGM  (Not Specified)   Family History  Problem Relation Age of Onset  . Heart disease Maternal Grandmother      ROS:  Full 14 point review of systems complete and found to be negative unless listed above.  Physical Exam: Blood pressure 112/75, pulse (!) 134, temperature 98.8 F (37.1 C), temperature source Oral, resp. rate 18, height 5\' 6"  (1.676 m), weight 83.2 kg (183 lb 6.4 oz), SpO2 97 %.  General: Well developed, well nourished, male in no acute distress Head: Eyes PERRLA, No xanthomas. Normocephalic and atraumatic, oropharynx without edema or exudate.  Lungs: Resp regular and unlabored, CTA. Heart: RRR no s3, s4, or murmurs.  Neck: No carotid bruits. No lymphadenopathy.  No JVD. Abdomen: Bowel sounds  present, abdomen soft and non-tender without masses or hernias noted. Msk:  No spine or cva tenderness. No weakness, no joint deformities or effusions. Extremities: No clubbing, cyanosis. 1 + BL LE edema. DP/PT/Radials 2+ and equal bilaterally. Neuro: Alert and oriented X 3. No focal deficits noted. Psych:  Good affect, responds appropriately Skin: No rashes or lesions noted.  Labs:   Lab Results  Component Value Date   WBC 8.5 01/03/2018   HGB 12.4 (L) 01/03/2018   HCT 37.4 (L) 01/03/2018   MCV 91.0 01/03/2018   PLT 228 01/03/2018   Recent Labs    01/03/18 0314  INR 1.06    Recent Labs  Lab 01/02/18 1311 01/02/18 1650  NA 138  --  K 4.2  --   CL 104  --   CO2 23  --   BUN 20  --   CREATININE 1.24 1.22  CALCIUM 7.7* 7.7*  PROT 5.7*  --   BILITOT 0.3  --   ALKPHOS 68  --   ALT 308*  --   AST 165*  --   GLUCOSE 129*  --   ALBUMIN 2.7*  --    Magnesium  Date Value Ref Range Status  01/02/2018 2.2 1.7 - 2.4 mg/dL Final    Comment:    Performed at [facility], [address] [place], [place] [address]   Recent Labs    01/02/18 1312 01/02/18 1650 01/02/18 1851 01/03/18 0314  CKTOTAL 355  --  335  --   CKMB  --   --  5.2*  --   TROPONINI  --  0.10* 0.09* 0.08*   Recent Labs    01/02/18 1320  TROPIPOC 0.07   TSH  Date/Time Value Ref Range Status  01/02/2018 06:49 PM 0.511 0.350 - 4.500 uIU/mL Final    Comment:    Performed by a 3rd Generation assay with a functional sensitivity of <=0.01 uIU/mL. Performed at [facility], [address] [place], [place] [address]     ECG: Tachycardia at rate of 131 bpm with nonspecific T wave inversion-personally reviewed Telemetry: Tachycardia at rate of 130 -personally reviewed  Radiology:  Dg Chest 2 View  Result Date: 01/02/2018 CLINICAL DATA:  Short of breath and chest pain EXAM: CHEST - 2 VIEW COMPARISON:  None. FINDINGS: Bilateral perihilar airspace disease is symmetric and most consistent  with edema. Small pleural effusions. Heart size upper normal. Mediastinal contours normal. No mass lesion. IMPRESSION: Perihilar airspace disease bilaterally most likely edema. Electronically Signed   By: Marlan Palau M.D.   On: 01/02/2018 12:43   Ct Angio Chest Pe W And/or Wo Contrast  Result Date: 01/02/2018 CLINICAL DATA:  Shortness of breath, recently completed body building competition EXAM: CT ANGIOGRAPHY CHEST WITH CONTRAST TECHNIQUE: Multidetector CT imaging of the chest was performed using the standard protocol during bolus administration of intravenous contrast. Multiplanar CT image reconstructions and MIPs were obtained to evaluate the vascular anatomy. CONTRAST:  81mL ISOVUE-370 IOPAMIDOL (ISOVUE-370) INJECTION 76% IV COMPARISON:  None FINDINGS: Cardiovascular: Aorta normal caliber without aneurysm or dissection. No pericardial effusion. Pulmonary arteries adequately opacified and patent. Respiratory motion artifacts at the lower lobes. No evidence of pulmonary embolism. Mediastinum/Nodes: No thoracic adenopathy. Esophagus unremarkable. Base of cervical region normal appearance. Lungs/Pleura: Patchy airspace infiltrates identified throughout all lobes question edema versus multifocal pneumonia. Small BILATERAL pleural effusions. No pneumothorax. No endobronchial lesions. Upper Abdomen: Unremarkable Musculoskeletal: Normal appearance Review of the MIP images confirms the above findings. IMPRESSION: No evidence pulmonary embolism. Small BILATERAL pleural effusions with patchy BILATERAL airspace infiltrates centrally throughout both lungs question pulmonary edema versus multifocal pneumonia. Electronically Signed   By: Ulyses Southward M.D.   On: 01/02/2018 14:53    ASSESSMENT AND PLAN:     1.  Dyspnea -Combination of pneumonia and acute CHF, presumably diastolic. -CTA negative for pulmonary embolism.  Echocardiogram with normal LV function and severe LVH.  No comment on diastolic dysfunction.  TSH  normal -His breathing is improving with current medication.  2.  Sinus arrhythmia -Will review strip with MD  3.  Elevated troponin -Flat trend.  Likely demand in setting of acute illness.  Denies chest pain.  SignedManson Passey, PA 01/03/2018, 10:38  AM  Co-Sign MD

## 2018-01-03 NOTE — Clinical Social Work Note (Signed)
CSW acknowledges discharge planning consult. PT evaluated patient and recommended no PT follow up.   CSW signing off. Consult again if any other social work needs arise.  Charlynn CourtSarah Trista Ciocca, CSW [phone]

## 2018-01-03 NOTE — Evaluation (Addendum)
Physical Therapy Evaluation Patient Details Name: Evan Vargas MRN: [id number] DOB: [birth year] Today's Date: 01/03/2018   History of Present Illness  Pt is a 37 y.o. male admitted 01/02/18 with acute onset of SOB and fever; worked up for acute respiratory failure with hypercapnia on BiPAP overnight. Chest CTA negative for PE; possible pneumonia. Ruling out sepsis and rhabdomyolysis. Of note, just finished a dietary regimen for body building competition. Only PMH includes ACL repair.    Clinical Impression  Patient evaluated by Physical Therapy with no further acute PT needs identified. 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 Pt currently indep with all mobility; HR 127-137 with mobility, SpO2 >92% on RA. Pt continues to c/o "heavy breathing" with DOE 2/4. Educ on pursed lip breathing, energy conservation, and rest from rigorous physical activity. All education has been completed and the patient has no further questions. PT is signing off. Thank you for this referral.  Resting BP 124/81 Post-amb BP 121/84    Follow Up Recommendations No PT follow up    Equipment Recommendations  None recommended by PT    Recommendations for Other Services       Precautions / Restrictions Precautions Precautions: None Restrictions Weight Bearing Restrictions: No      Mobility  Bed Mobility Overal bed mobility: Independent                Transfers Overall transfer level: Independent                  Ambulation/Gait Ambulation/Gait assistance: Independent Ambulation Distance (Feet): 400 Feet Assistive device: None Gait Pattern/deviations: WFL(Within Functional Limits)     General Gait Details: HR up 127-137 with ambulation. SpO2 >92% on RA  Stairs            Wheelchair Mobility    Modified Rankin (Stroke Patients Only)       Balance Overall balance assessment: Independent                                            Pertinent Vitals/Pain Pain Assessment: No/denies pain    Home Living Family/patient expects to be discharged to:: Private residence Living Arrangements: Alone Available Help at Discharge: Friend(s);Available PRN/intermittently(Girlfriend) Type of Home: Apartment Home Access: Stairs to enter Entrance Stairs-Rails: Right;Left Entrance Stairs-Number of Steps: 10 Home Layout: One level Home Equipment: None      Prior Function Level of Independence: Independent         Comments: Recently finished body building competition and won 1st place. Works as Systems analystpersonal trainer at [employer]     Hand Dominance        Extremity/Trunk Assessment   Upper Extremity Assessment Upper Extremity Assessment: Overall WFL for tasks assessed    Lower Extremity Assessment Lower Extremity Assessment: Overall WFL for tasks assessed    Cervical / Trunk Assessment Cervical / Trunk Assessment: Normal  Communication   Communication: No difficulties  Cognition Arousal/Alertness: Awake/alert Behavior During Therapy: Flat affect Overall Cognitive Status: Within Functional Limits for tasks assessed                                        General Comments      Exercises     Assessment/Plan    PT  Assessment Patent does not need any further PT services  PT Problem List         PT Treatment Interventions      PT Goals (Current goals can be found in the Care Plan section)  Acute Rehab PT Goals PT Goal Formulation: All assessment and education complete, DC therapy    Frequency     Barriers to discharge        Co-evaluation               AM-PAC PT "6 Clicks" Daily Activity  Outcome Measure Difficulty turning over in bed (including adjusting bedclothes, sheets and blankets)?: None Difficulty moving from lying on back to sitting on the side of the bed? : None Difficulty sitting down on and standing up from a chair with arms (e.g.,  wheelchair, bedside commode, etc,.)?: None Help needed moving to and from a bed to chair (including a wheelchair)?: None Help needed walking in hospital room?: None Help needed climbing 3-5 steps with a railing? : None 6 Click Score: 24    End of Session Equipment Utilized During Treatment: Gait belt Activity Tolerance: Patient tolerated treatment well Patient left: in chair;with call bell/phone within reach Nurse Communication: Mobility status PT Visit Diagnosis: Other abnormalities of gait and mobility (R26.89)    Time: 1610-9604 PT Time Calculation (min) (ACUTE ONLY): 18 min   Charges:   PT Evaluation $PT Eval Low Complexity: 1 Low     PT G Codes:       Ina Homes, PT, DPT Acute Rehab Services  Pager: [phone]  Malachy Chamber 01/03/2018, 9:16 AM

## 2018-01-03 NOTE — Progress Notes (Signed)
His blood pressure running low.  Will start metoprolol at lower dose of 12.5 mg twice daily.  Hold for systolic blood pressure less than 90.  Titrate as blood pressure allows.

## 2018-01-03 NOTE — Progress Notes (Signed)
PROGRESS NOTE   Evan Vargas  WUJ:[id number]    DOB: 28-Feb-1981    DOA: 01/02/2018  PCP: Default, Provider, MD   I have briefly reviewed patients previous medical records in [facility].  Brief Narrative:  37 year old male with no significant PMH, recently completed several weeks of aggressive regimen of diet and exercise for a bodybuilding competition on 3/16, since then has reverted to regular diet, resented to ED with 2 days history of progressively worsening dyspnea, fevers of up to 101 F, generalized weakness, intermittent palpitations but no chest pain.  In the ED, hypoxic requiring BiPAP, tachycardic in the 130s, tachypneic 32, blood pressure 128/94, CTA chest negative for PE and questioned pulmonary edema versus multifocal pneumonia.  Admitted for acute respiratory failure with hypoxia and tachycardia.  Cardiology consulted on 3/23 for tachycardia.   Assessment & Plan:   Principal Problem:   Acute respiratory failure with hypoxia (HCC) Active Problems:   Tachycardia   Community acquired pneumonia   Acute diastolic CHF (congestive heart failure) (HCC)   Acute respiratory failure with hypoxia: Possibly multifactorial related to acute diastolic CHF and community-acquired pneumonia.  CTA chest negative for PE but questioned pulmonary edema versus multifocal pneumonia.  Treat underlying cause as below.  Briefly on BiPAP in ED.  Hypoxia resolved.  Monitor closely.  Possible community-acquired pneumonia: Continue empirically started IV ceftriaxone and azithromycin.  Blood cultures x2: Negative to date.  Urine culture shows insignificant growth.  Influenza panel PCR negative.  HIV screen nonreactive.  Recommend repeating chest x-ray in 4 weeks to ensure resolution of pneumonia findings.  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  LVEF 65-70%.  Severely dilated left and right atrium.  Cardiology input appreciated.  Continue Lasix 40 mg IV twice  daily.  BNP on admission: 1059.  Improving.  Tachycardia: Atrial flutter or RP tachycardia: Cardiology input appreciated.  Have initiated metoprolol 12.5 mg twice daily and Eliquis.  Plan for TEE/DC C on 3/25.  TSH normal at 0.511.  UDS negative.  Elevated troponin: Likely related to demand from tachycardia, acute respiratory failure and CHF.  Flat trend.  No chest pain.  Per cardiology.  ? HOCM/restrictive cardiomyopathy: Cardiology input appreciated.  Considering cardiac MRI when heart rate is slower to assess for infiltrative etiologies.  Prediabetes: A1c 5.9.  Will need close outpatient monitoring.  Elevated lactate: Likely related to acute respiratory failure.  Resolved.  Abnormal LFTs: AST: 165, ALT 308.?  Related to acute hepatic congestion from CHF versus recent acute dietary changes.  Follow LFTs closely.  Creatinine also 1.24.  Follow BMP closely.  Acute hepatitis panel negative.  DVT prophylaxis: Now started on Eliquis. Code Status: Full Family Communication: Discussed in detail with patient's fianc at bedside. Disposition: DC home pending clinical improvement and further cardiac evaluation, possibly early next week.   Consultants:  Cardiology.  Procedures:  TTE  Antimicrobials:  IV ceftriaxone and azithromycin.   Subjective: Feels better compared to admission.  Dyspnea significantly improved ("improved by 97%").  Never had chest pain.  No cough.  Fevers better.  Feels slightly stronger.  Reports grandmother with heart disease at age 65.  No other family members with early cardiac death.  ROS: As above.  Objective:  Vitals:   01/03/18 1026 01/03/18 1135 01/03/18 1155 01/03/18 1251  BP:  94/72 101/61 119/71  Pulse: (!) 134  (!) 135 (!) 137  Resp: 18 (!) 24 17   Temp:  98.4 F (36.9 C) 98.5 F (36.9  C)   TempSrc:  Oral Oral   SpO2: 97%  96%   Weight:      Height:        Examination:  General exam: Pleasant young male, well built and nourished, lying  comfortably propped up in bed. Respiratory system: Few bibasilar crackles but rest of lung fields clear to auscultation. Respiratory effort normal. Cardiovascular system: S1 & S2 heard, regular tachycardic. No JVD, murmurs, rubs, gallops or clicks. No pedal edema.  Telemetry: Regular normal complex tachycardia in the 130s. Gastrointestinal system: Abdomen is nondistended, soft and nontender. No organomegaly or masses felt. Normal bowel sounds heard. Central nervous system: Alert and oriented. No focal neurological deficits. Extremities: Symmetric 5 x 5 power. Skin: No rashes, lesions or ulcers Psychiatry: Judgement and insight appear normal. Mood & affect appropriate.     Data Reviewed: I have personally reviewed following labs and imaging studies  CBC: Recent Labs  Lab 01/02/18 1311 01/02/18 1650 01/03/18 0314  WBC 7.8 6.5 8.5  NEUTROABS 6.0  --   --   HGB 12.8* 12.5* 12.4*  HCT 38.2* 37.6* 37.4*  MCV 91.8 90.8 91.0  PLT 188 209 228   Basic Metabolic Panel: Recent Labs  Lab 01/02/18 1311 01/02/18 1650  NA 138  --   K 4.2  --   CL 104  --   CO2 23  --   GLUCOSE 129*  --   BUN 20  --   CREATININE 1.24 1.22  CALCIUM 7.7* 7.7*  MG  --  2.2  PHOS  --  3.0   Liver Function Tests: Recent Labs  Lab 01/02/18 1311  AST 165*  ALT 308*  ALKPHOS 68  BILITOT 0.3  PROT 5.7*  ALBUMIN 2.7*   Coagulation Profile: Recent Labs  Lab 01/03/18 0314  INR 1.06   Cardiac Enzymes: Recent Labs  Lab 01/02/18 1312 01/02/18 1650 01/02/18 1851 01/03/18 0314  CKTOTAL 355  --  335  --   CKMB  --   --  5.2*  --   TROPONINI  --  0.10* 0.09* 0.08*   HbA1C: Recent Labs    01/02/18 1849  HGBA1C 5.9*   CBG: Recent Labs  Lab 01/03/18 0841  GLUCAP 115*    Recent Results (from the past 240 hour(s))  Blood culture (routine x 2)     Status: None (Preliminary result)   Collection Time: 01/02/18  2:57 PM  Result Value Ref Range Status   Specimen Description BLOOD LEFT  ANTECUBITAL  Final   Special Requests   Final    IN PEDIATRIC BOTTLE Blood Culture results may not be optimal due to an excessive volume of blood received in culture bottles   Culture   Final    NO GROWTH < 24 HOURS Performed at [facility], [address] [place], [place] [address]    Report Status PENDING  Incomplete  Blood culture (routine x 2)     Status: None (Preliminary result)   Collection Time: 01/02/18  3:02 PM  Result Value Ref Range Status   Specimen Description BLOOD LEFT ANTECUBITAL  Final   Special Requests   Final    BOTTLES DRAWN AEROBIC AND ANAEROBIC Blood Culture adequate volume   Culture   Final    NO GROWTH < 24 HOURS Performed at [facility], [address] [place], [place] [address]    Report Status PENDING  Incomplete  Urine culture     Status: Abnormal   Collection Time:  01/02/18  4:19 PM  Result Value Ref Range Status   Specimen Description URINE, RANDOM  Final   Special Requests NONE  Final   Culture (A)  Final    <10,000 COLONIES/mL INSIGNIFICANT GROWTH Performed at [facility], [address] [place], [place] [address]    Report Status 01/03/2018 FINAL  Final         Radiology Studies: Dg Chest 2 View  Result Date: 01/02/2018 CLINICAL DATA:  Short of breath and chest pain EXAM: CHEST - 2 VIEW COMPARISON:  None. FINDINGS: Bilateral perihilar airspace disease is symmetric and most consistent with edema. Small pleural effusions. Heart size upper normal. Mediastinal contours normal. No mass lesion. IMPRESSION: Perihilar airspace disease bilaterally most likely edema. Electronically Signed   By: Marlan Palauharles  Clark M.D.   On: 01/02/2018 12:43   Ct Angio Chest Pe W And/or Wo Contrast  Result Date: 01/02/2018 CLINICAL DATA:  Shortness of breath, recently completed body building competition EXAM: CT ANGIOGRAPHY CHEST WITH CONTRAST TECHNIQUE: Multidetector CT imaging of the chest was performed using the standard protocol during bolus  administration of intravenous contrast. Multiplanar CT image reconstructions and MIPs were obtained to evaluate the vascular anatomy. CONTRAST:  81mL ISOVUE-370 IOPAMIDOL (ISOVUE-370) INJECTION 76% IV COMPARISON:  None FINDINGS: Cardiovascular: Aorta normal caliber without aneurysm or dissection. No pericardial effusion. Pulmonary arteries adequately opacified and patent. Respiratory motion artifacts at the lower lobes. No evidence of pulmonary embolism. Mediastinum/Nodes: No thoracic adenopathy. Esophagus unremarkable. Base of cervical region normal appearance. Lungs/Pleura: Patchy airspace infiltrates identified throughout all lobes question edema versus multifocal pneumonia. Small BILATERAL pleural effusions. No pneumothorax. No endobronchial lesions. Upper Abdomen: Unremarkable Musculoskeletal: Normal appearance Review of the MIP images confirms the above findings. IMPRESSION: No evidence pulmonary embolism. Small BILATERAL pleural effusions with patchy BILATERAL airspace infiltrates centrally throughout both lungs question pulmonary edema versus multifocal pneumonia. Electronically Signed   By: Ulyses SouthwardMark  Boles M.D.   On: 01/02/2018 14:53        Scheduled Meds: . apixaban  5 mg Oral BID  . aspirin EC  81 mg Oral Daily  . azithromycin  500 mg Oral Q1500  . docusate sodium  100 mg Oral BID  . furosemide  40 mg Intravenous BID  . metoprolol tartrate  12.5 mg Oral BID   Continuous Infusions: . cefTRIAXone (ROCEPHIN)  IV       LOS: 1 day     Marcellus ScottAnand Katalyn Matin, MD, FACP, [facility]. [facility] Pager [phone]  If 7PM-7AM, please contact night-coverage [email or web address] Password [email or web address] 01/03/2018, 2:59 PM

## 2018-01-04 DIAGNOSIS — J189 Pneumonia, unspecified organism: Principal | ICD-10-CM

## 2018-01-04 DIAGNOSIS — J810 Acute pulmonary edema: Secondary | ICD-10-CM

## 2018-01-04 LAB — COMPREHENSIVE METABOLIC PANEL
ALT: 226 U/L — AB (ref 17–63)
AST: 107 U/L — AB (ref 15–41)
Albumin: 2.4 g/dL — ABNORMAL LOW (ref 3.5–5.0)
Alkaline Phosphatase: 79 U/L (ref 38–126)
Anion gap: 13 (ref 5–15)
BILIRUBIN TOTAL: 0.4 mg/dL (ref 0.3–1.2)
BUN: 30 mg/dL — AB (ref 6–20)
CO2: 25 mmol/L (ref 22–32)
CREATININE: 1.36 mg/dL — AB (ref 0.61–1.24)
Calcium: 8.5 mg/dL — ABNORMAL LOW (ref 8.9–10.3)
Chloride: 104 mmol/L (ref 101–111)
GFR calc Af Amer: >60 mL/min (ref >60)
Glucose, Bld: 105 mg/dL — ABNORMAL HIGH (ref 65–99)
POTASSIUM: 3.4 mmol/L — AB (ref 3.5–5.1)
Sodium: 142 mmol/L (ref 135–145)
TOTAL PROTEIN: 5.2 g/dL — AB (ref 6.5–8.1)

## 2018-01-04 LAB — CBC
HEMATOCRIT: 37.4 % — AB (ref 39.0–52.0)
Hemoglobin: 12.1 g/dL — ABNORMAL LOW (ref 13.0–17.0)
MCH: 30 pg (ref 26.0–34.0)
MCHC: 32.4 g/dL (ref 30.0–36.0)
MCV: 92.8 fL (ref 78.0–100.0)
Platelets: 273 10*3/uL (ref 150–400)
RBC: 4.03 MIL/uL — ABNORMAL LOW (ref 4.22–5.81)
RDW: 15.3 % (ref 11.5–15.5)
WBC: 8.3 10*3/uL (ref 4.0–10.5)

## 2018-01-04 LAB — GLUCOSE, CAPILLARY: Glucose-Capillary: 114 mg/dL — ABNORMAL HIGH (ref 65–99)

## 2018-01-04 MED ORDER — POTASSIUM CHLORIDE CRYS ER 20 MEQ PO TBCR
20.0000 meq | EXTENDED_RELEASE_TABLET | Freq: Two times a day (BID) | ORAL | Status: DC
  Administered 2018-01-04 – 2018-01-05 (×3): 20 meq via ORAL
  Filled 2018-01-04 (×3): qty 1

## 2018-01-04 MED ORDER — SODIUM CHLORIDE 0.9 % IV SOLN
INTRAVENOUS | Status: DC
  Administered 2018-01-04: 22:00:00 via INTRAVENOUS

## 2018-01-04 MED ORDER — METOPROLOL TARTRATE 25 MG PO TABS: 25.0000 mg | ORAL_TABLET | Freq: Three times a day (TID) | ORAL | Status: DC

## 2018-01-04 MED ORDER — METOPROLOL TARTRATE 25 MG PO TABS
25.0000 mg | ORAL_TABLET | Freq: Three times a day (TID) | ORAL | Status: DC
  Administered 2018-01-04 (×3): 25 mg via ORAL
  Filled 2018-01-04 (×3): qty 1

## 2018-01-04 NOTE — Progress Notes (Signed)
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  Found to have bilateral opacities on CXR, fever, but also elevated BNP, junctional tachycardia vs atrial flutter.  CTA neg for PE.  Started on antibiotics empirically.  Cardiology consulted for tachycardia, BNP, troponin.       Assessment & Plan:  Acute respiratory failure with hypoxia Differential includes both pneumonia and acute diastolic CHF.  No change to hypoxia yesterday. 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  ?HOCM.  Doubt ACS.  Still tachycardic to 120s.  Tele reviewed, same fast regular narrow rhythm, no P waves really appreciable.  Net even yesterday. -Continue metoprolol -Continue furosemide -Strict I/Os, daily weights, daily BMP -K supplement -Consult to Cardiology -Cardiac MRI pending and TEE pending -Patient had questions about TEE, deferred to Cardiology, but explained that this was likely therapeutic not diagnostic, with goal to correct tachyarrhythmia, he did not seem clear about purpose -Continue Eliquis   Possible community acquired pneumonia -Continue azithyromycin  -Repeat CXR in 4 weeks  Prediabetes Outpatient follow up  Transaminitis -Trend LFTs    DVT prophylaxis: Eliquis Code Status: FULL Family Communication: None present MDM and disposition Plan: The below labs and imaging reports were reviewed.  The patient's status is clinically stable, no improvement.  He presents with respiratory failure, acute diastolic CHF new, new  tachyarrhtmia.  He has pneumonia and transaminitis.   Consultants:   Cardiology  Procedures:   TTE  Antimicrobials:   Azithromycin    Subjective: Feels same.  Breathing is better.  Still on O2.  No chest pain, cough improved.  No orthopnea, leg swelling, confusion, weakness.  Objective: Vitals:   01/03/18 1800 01/03/18 2031 01/04/18 0616 01/04/18 0815  BP:  104/69 137/80 106/69  Pulse:  (!) 123 (!) 50 (!) 123  Resp:  (!) 32 (!) 25 (!) 31  Temp: 98.4 F (36.9 C) 98.2 F (36.8 C) 97.7 F (36.5 C) 98.1 F (36.7 C)  TempSrc: Oral  Oral Oral  SpO2:  91% 100% 94%  Weight:      Height:        Intake/Output Summary (Last 24 hours) at 01/04/2018 0842 Last data filed at 01/04/2018 0617 Gross per 24 hour  Intake 1210 ml  Output 1300 ml  Net -90 ml   Filed Weights   01/02/18 1224 01/02/18 1720 01/03/18 0309  Weight: 77.1 kg (170 lb) 84.5 kg (186 lb 4.6 oz) 83.2 kg (183 lb 6.4 oz)    Examination: General appearance: Young adult male, alert and in no acute distress.   HEENT: Anicteric, conjunctiva pink, lids and lashes normal. No nasal deformity, discharge, epistaxis.  Lips moist.   Skin: Warm and dry.  No suspicious rashes or lesions. Cardiac: Tachycardic, reguylar, nl S1-S2, no murmurs appreciated.  Capillary refill is brisk.  JVP normal.  No LE edema.  Radial pulses 2+ and symmetric. Respiratory: Normal respiratory rate and rhythm.  CTAB without rales or wheezes. Abdomen: Abdomen soft.  No TTP. No ascites, distension, hepatosplenomegaly.   MSK: No deformities or effusions. Neuro: Awake and alert.  EOMI, moves all extremities. Speech fluent.  Psych: Sensorium intact and responding to questions, attention normal. Affect blunted.  Judgment and insight appear normal.    Data Reviewed: I have personally reviewed following labs and imaging studies:  CBC: Recent Labs  Lab 01/02/18 1311 01/02/18 1650 01/03/18 0314 01/04/18 0302  WBC 7.8 6.5 8.5 8.3  NEUTROABS 6.0   --   --   --   HGB 12.8* 12.5* 12.4* 12.1*  HCT 38.2* 37.6* 37.4* 37.4*  MCV 91.8 90.8 91.0 92.8  PLT 188 209 228 273   Basic Metabolic Panel: Recent Labs  Lab 01/02/18 1311 01/02/18 1650 01/04/18 0302  NA 138  --  142  K 4.2  --  3.4*  CL 104  --  104  CO2 23  --  25  GLUCOSE 129*  --  105*  BUN 20  --  30*  CREATININE 1.24 1.22 1.36*  CALCIUM 7.7* 7.7* 8.5*  MG  --  2.2  --   PHOS  --  3.0  --    GFR: Estimated Creatinine Clearance: 76.8 mL/min (A) (by C-G formula based on SCr of 1.36 mg/dL (H)). Liver Function Tests: Recent Labs  Lab 01/02/18 1311 01/04/18 0302  AST 165* 107*  ALT 308* 226*  ALKPHOS 68 79  BILITOT 0.3 0.4  PROT 5.7* 5.2*  ALBUMIN 2.7* 2.4*   No results for input(s): LIPASE, AMYLASE in the last 168 hours. No results for input(s): AMMONIA in the last 168 hours. Coagulation Profile: Recent Labs  Lab 01/03/18 0314  INR 1.06   Cardiac Enzymes: Recent Labs  Lab 01/02/18 1312 01/02/18 1650 01/02/18 1851 01/03/18 0314  CKTOTAL 355  --  335  --   CKMB  --   --  5.2*  --   TROPONINI  --  0.10* 0.09* 0.08*   BNP (last 3 results) No results for input(s): PROBNP in the last 8760 hours. HbA1C: Recent Labs    01/02/18 1849  HGBA1C 5.9*   CBG: Recent Labs  Lab 01/03/18 0841 01/04/18 0609  GLUCAP 115* 114*   Lipid Profile: No results for input(s): CHOL, HDL, LDLCALC, TRIG, CHOLHDL, LDLDIRECT in the last 72 hours. Thyroid Function Tests: Recent Labs    01/02/18 1849  TSH 0.511   Anemia Panel: No results for input(s): VITAMINB12, FOLATE, FERRITIN, TIBC, IRON, RETICCTPCT in the last 72 hours. Urine analysis:    Component Value Date/Time   COLORURINE COLORLESS (A) 01/02/2018 1527   APPEARANCEUR CLEAR 01/02/2018 1527   LABSPEC 1.011 01/02/2018 1527   PHURINE 7.0 01/02/2018 1527   GLUCOSEU NEGATIVE 01/02/2018 1527   HGBUR NEGATIVE 01/02/2018 1527   BILIRUBINUR NEGATIVE 01/02/2018 1527   KETONESUR NEGATIVE 01/02/2018 1527    PROTEINUR NEGATIVE 01/02/2018 1527   NITRITE NEGATIVE 01/02/2018 1527   LEUKOCYTESUR NEGATIVE 01/02/2018 1527   Sepsis Labs: @LABRCNTIP (procalcitonin:4,lacticacidven:4)  ) Recent Results (from the past 240 hour(s))  Blood culture (routine x 2)     Status: None (Preliminary result)   Collection Time: 01/02/18  2:57 PM  Result Value Ref Range Status   Specimen Description BLOOD LEFT ANTECUBITAL  Final   Special Requests   Final    IN PEDIATRIC BOTTLE Blood Culture results may not be optimal due to an excessive volume of blood received in culture bottles   Culture   Final    NO GROWTH < 24 HOURS Performed at [facility], [address] [place], [place] [address]    Report Status PENDING  Incomplete  Blood culture (routine x 2)  Status: None (Preliminary result)   Collection Time: 01/02/18  3:02 PM  Result Value Ref Range Status   Specimen Description BLOOD LEFT ANTECUBITAL  Final   Special Requests   Final    BOTTLES DRAWN AEROBIC AND ANAEROBIC Blood Culture adequate volume   Culture   Final    NO GROWTH < 24 HOURS Performed at [facility], [address], [place], [place] [address]    Report Status PENDING  Incomplete  Urine culture     Status: Abnormal   Collection Time: 01/02/18  4:19 PM  Result Value Ref Range Status   Specimen Description URINE, RANDOM  Final   Special Requests NONE  Final   Culture (A)  Final    <10,000 COLONIES/mL INSIGNIFICANT GROWTH Performed at [facility], [address], [place], [place] [address]    Report Status 01/03/2018 FINAL  Final         Radiology Studies: Dg Chest 2 View  Result Date: 01/02/2018 CLINICAL DATA:  Short of breath and chest pain EXAM: CHEST - 2 VIEW COMPARISON:  None. FINDINGS: Bilateral perihilar airspace disease is symmetric and most consistent with edema. Small pleural effusions. Heart size upper normal. Mediastinal contours normal. No mass lesion. IMPRESSION: Perihilar airspace disease  bilaterally most likely edema. Electronically Signed   By: Marlan Palau M.D.   On: 01/02/2018 12:43   Ct Angio Chest Pe W And/or Wo Contrast  Result Date: 01/02/2018 CLINICAL DATA:  Shortness of breath, recently completed body building competition EXAM: CT ANGIOGRAPHY CHEST WITH CONTRAST TECHNIQUE: Multidetector CT imaging of the chest was performed using the standard protocol during bolus administration of intravenous contrast. Multiplanar CT image reconstructions and MIPs were obtained to evaluate the vascular anatomy. CONTRAST:  81mL ISOVUE-370 IOPAMIDOL (ISOVUE-370) INJECTION 76% IV COMPARISON:  None FINDINGS: Cardiovascular: Aorta normal caliber without aneurysm or dissection. No pericardial effusion. Pulmonary arteries adequately opacified and patent. Respiratory motion artifacts at the lower lobes. No evidence of pulmonary embolism. Mediastinum/Nodes: No thoracic adenopathy. Esophagus unremarkable. Base of cervical region normal appearance. Lungs/Pleura: Patchy airspace infiltrates identified throughout all lobes question edema versus multifocal pneumonia. Small BILATERAL pleural effusions. No pneumothorax. No endobronchial lesions. Upper Abdomen: Unremarkable Musculoskeletal: Normal appearance Review of the MIP images confirms the above findings. IMPRESSION: No evidence pulmonary embolism. Small BILATERAL pleural effusions with patchy BILATERAL airspace infiltrates centrally throughout both lungs question pulmonary edema versus multifocal pneumonia. Electronically Signed   By: Ulyses Southward M.D.   On: 01/02/2018 14:53        Scheduled Meds: . apixaban  5 mg Oral BID  . azithromycin  500 mg Oral Q1500  . docusate sodium  100 mg Oral BID  . furosemide  40 mg Intravenous BID  . metoprolol tartrate  12.5 mg Oral BID   Continuous Infusions: . cefTRIAXone (ROCEPHIN)  IV Stopped (01/03/18 1720)     LOS: 2 days    Time spent: 30 minutes    Alberteen Sam, MD [facility] 01/04/2018, 8:42 AM     Pager [phone] --- please page though AMION:  [email or web address] Password [email or web address] If 7PM-7AM, please contact night-coverage

## 2018-01-04 NOTE — Progress Notes (Signed)
Paged Dr. Eden EmmsNishan [phone]) and Cardiology (weekend) on-call Cardiology, [place].  Patient stated that they did not want to sign the consent for the Transesophageal-cardioversion scheduled for 01/05/18 before speaking with Dr. Eden EmmsNishan since they had further questions about the procedure and plan of care.

## 2018-01-04 NOTE — Progress Notes (Signed)
Progress Note  Patient Name: Evan Vargas Date of Encounter: 01/04/2018  Primary Cardiologist: No primary care provider on file.   Subjective   Dejected about diagnosis Not use to being sick/inactive  Inpatient Medications    Scheduled Meds: . apixaban  5 mg Oral BID  . azithromycin  500 mg Oral Q1500  . docusate sodium  100 mg Oral BID  . furosemide  40 mg Intravenous BID  . metoprolol tartrate  12.5 mg Oral BID  . potassium chloride  20 mEq Oral BID   Continuous Infusions: . cefTRIAXone (ROCEPHIN)  IV Stopped (01/03/18 1720)   PRN Meds: acetaminophen, levalbuterol, magnesium citrate, ondansetron **OR** ondansetron (ZOFRAN) IV, senna-docusate, sorbitol   Vital Signs    Vitals:   01/03/18 1800 01/03/18 2031 01/04/18 0616 01/04/18 0815  BP:  104/69 137/80 106/69  Pulse:  (!) 123 (!) 50 (!) 123  Resp:  (!) 32 (!) 25 (!) 31  Temp: 98.4 F (36.9 C) 98.2 F (36.8 C) 97.7 F (36.5 C) 98.1 F (36.7 C)  TempSrc: Oral  Oral Oral  SpO2:  91% 100% 94%  Weight:      Height:        Intake/Output Summary (Last 24 hours) at 01/04/2018 0955 Last data filed at 01/04/2018 0617 Gross per 24 hour  Intake 960 ml  Output 1300 ml  Net -340 ml   Filed Weights   01/02/18 1224 01/02/18 1720 01/03/18 0309  Weight: 170 lb (77.1 kg) 186 lb 4.6 oz (84.5 kg) 183 lb 6.4 oz (83.2 kg)    Telemetry    Flutter rates 120 - Personally Reviewed  ECG    Atypical flutter non specific ST changes reviewed with Dr Elberta Fortisamnitz - Personally Reviewed  Physical Exam  Black male  GEN: No acute distress.   Neck: No JVD Cardiac: RRR, no murmurs, rubs, or gallops.  Respiratory: Clear to auscultation bilaterally. GI: Soft, nontender, non-distended  MS: No edema; No deformity. Neuro:  Nonfocal  Psych: Normal affect   Labs    Chemistry Recent Labs  Lab 01/02/18 1311 01/02/18 1650 01/04/18 0302  NA 138  --  142  K 4.2  --  3.4*  CL 104  --  104  CO2 23  --  25  GLUCOSE 129*  --  105*    BUN 20  --  30*  CREATININE 1.24 1.22 1.36*  CALCIUM 7.7* 7.7* 8.5*  PROT 5.7*  --  5.2*  ALBUMIN 2.7*  --  2.4*  AST 165*  --  107*  ALT 308*  --  226*  ALKPHOS 68  --  79  BILITOT 0.3  --  0.4  GFRNONAA >60 >60 >60  GFRAA >60 >60 >60  ANIONGAP 11  --  13     Hematology Recent Labs  Lab 01/02/18 1650 01/03/18 0314 01/04/18 0302  WBC 6.5 8.5 8.3  RBC 4.14* 4.11* 4.03*  HGB 12.5* 12.4* 12.1*  HCT 37.6* 37.4* 37.4*  MCV 90.8 91.0 92.8  MCH 30.2 30.2 30.0  MCHC 33.2 33.2 32.4  RDW 14.9 15.1 15.3  PLT 209 228 273    Cardiac Enzymes Recent Labs  Lab 01/02/18 1650 01/02/18 1851 01/03/18 0314  TROPONINI 0.10* 0.09* 0.08*    Recent Labs  Lab 01/02/18 1320  TROPIPOC 0.07     BNP Recent Labs  Lab 01/02/18 1311 01/02/18 1527  BNP 1,008.6* 1,059.7*     DDimer No results for input(s): DDIMER in the last 168 hours.   Radiology  Dg Chest 2 View  Result Date: 01/02/2018 CLINICAL DATA:  Short of breath and chest pain EXAM: CHEST - 2 VIEW COMPARISON:  None. FINDINGS: Bilateral perihilar airspace disease is symmetric and most consistent with edema. Small pleural effusions. Heart size upper normal. Mediastinal contours normal. No mass lesion. IMPRESSION: Perihilar airspace disease bilaterally most likely edema. Electronically Signed   By: Marlan Palau M.D.   On: 01/02/2018 12:43   Ct Angio Chest Pe W And/or Wo Contrast  Result Date: 01/02/2018 CLINICAL DATA:  Shortness of breath, recently completed body building competition EXAM: CT ANGIOGRAPHY CHEST WITH CONTRAST TECHNIQUE: Multidetector CT imaging of the chest was performed using the standard protocol during bolus administration of intravenous contrast. Multiplanar CT image reconstructions and MIPs were obtained to evaluate the vascular anatomy. CONTRAST:  81mL ISOVUE-370 IOPAMIDOL (ISOVUE-370) INJECTION 76% IV COMPARISON:  None FINDINGS: Cardiovascular: Aorta normal caliber without aneurysm or dissection. No  pericardial effusion. Pulmonary arteries adequately opacified and patent. Respiratory motion artifacts at the lower lobes. No evidence of pulmonary embolism. Mediastinum/Nodes: No thoracic adenopathy. Esophagus unremarkable. Base of cervical region normal appearance. Lungs/Pleura: Patchy airspace infiltrates identified throughout all lobes question edema versus multifocal pneumonia. Small BILATERAL pleural effusions. No pneumothorax. No endobronchial lesions. Upper Abdomen: Unremarkable Musculoskeletal: Normal appearance Review of the MIP images confirms the above findings. IMPRESSION: No evidence pulmonary embolism. Small BILATERAL pleural effusions with patchy BILATERAL airspace infiltrates centrally throughout both lungs question pulmonary edema versus multifocal pneumonia. Electronically Signed   By: Ulyses Southward M.D.   On: 01/02/2018 14:53    Cardiac Studies   Echo ? Restrictive DCM biatrial enlargement LVH normal EF  Patient Profile     37 y.o. 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  Will need f/u cardiac MRI Tuesday 3/26 after rhythm slowed To further assess myocardium   Assessment & Plan    Flutter increase metoprolol continue eliquis orders for TEE / [facility] written NPO in am DCM:  Possible restrictive DCM cardiac MRI once rhythm taken care of cannot gate Good study with such a rapid HR.    For questions or updates, please contact [facility] Please consult [email or web address] for contact info under Cardiology/STEMI.      Signed, Charlton Haws, MD  01/04/2018, 9:55 AM

## 2018-01-05 ENCOUNTER — Inpatient Hospital Stay (HOSPITAL_COMMUNITY): Payer: Self-pay

## 2018-01-05 ENCOUNTER — Encounter (HOSPITAL_COMMUNITY)
Admission: EM | Disposition: A | Discharge status: Home / Self Care | Payer: Self-pay | Source: Home / Self Care | Attending: Family Medicine

## 2018-01-05 ENCOUNTER — Telehealth: Payer: Self-pay | Admitting: Nurse Practitioner

## 2018-01-05 ENCOUNTER — Telehealth: Payer: Self-pay

## 2018-01-05 DIAGNOSIS — I5031 Acute diastolic (congestive) heart failure: Secondary | ICD-10-CM

## 2018-01-05 DIAGNOSIS — I422 Other hypertrophic cardiomyopathy: Secondary | ICD-10-CM

## 2018-01-05 DIAGNOSIS — J9601 Acute respiratory failure with hypoxia: Secondary | ICD-10-CM

## 2018-01-05 DIAGNOSIS — R748 Abnormal levels of other serum enzymes: Secondary | ICD-10-CM

## 2018-01-05 DIAGNOSIS — I425 Other restrictive cardiomyopathy: Secondary | ICD-10-CM

## 2018-01-05 LAB — COMPREHENSIVE METABOLIC PANEL
ALBUMIN: 2.5 g/dL — AB (ref 3.5–5.0)
ALK PHOS: 81 U/L (ref 38–126)
ALT: 197 U/L — ABNORMAL HIGH (ref 17–63)
ANION GAP: 9 (ref 5–15)
AST: 73 U/L — ABNORMAL HIGH (ref 15–41)
BUN: 31 mg/dL — ABNORMAL HIGH (ref 6–20)
CALCIUM: 8.4 mg/dL — AB (ref 8.9–10.3)
CO2: 25 mmol/L (ref 22–32)
Chloride: 107 mmol/L (ref 101–111)
Creatinine, Ser: 1.31 mg/dL — ABNORMAL HIGH (ref 0.61–1.24)
GFR calc Af Amer: >60 mL/min (ref >60)
GFR calc non Af Amer: >60 mL/min (ref >60)
GLUCOSE: 82 mg/dL (ref 65–99)
POTASSIUM: 3.9 mmol/L (ref 3.5–5.1)
SODIUM: 141 mmol/L (ref 135–145)
Total Bilirubin: 0.3 mg/dL (ref 0.3–1.2)
Total Protein: 5.1 g/dL — ABNORMAL LOW (ref 6.5–8.1)

## 2018-01-05 LAB — CBC
HCT: 36.1 % — ABNORMAL LOW (ref 39.0–52.0)
HEMOGLOBIN: 11.6 g/dL — AB (ref 13.0–17.0)
MCH: 29.9 pg (ref 26.0–34.0)
MCHC: 32.1 g/dL (ref 30.0–36.0)
MCV: 93 fL (ref 78.0–100.0)
Platelets: 295 10*3/uL (ref 150–400)
RBC: 3.88 MIL/uL — ABNORMAL LOW (ref 4.22–5.81)
RDW: 15.4 % (ref 11.5–15.5)
WBC: 6.6 10*3/uL (ref 4.0–10.5)

## 2018-01-05 SURGERY — ECHOCARDIOGRAM, TRANSESOPHAGEAL
Anesthesia: Monitor Anesthesia Care

## 2018-01-05 MED ORDER — METOPROLOL SUCCINATE ER 25 MG PO TB24
25.0000 mg | ORAL_TABLET | Freq: Every day | ORAL | Status: DC
  Administered 2018-01-05: 25 mg via ORAL
  Filled 2018-01-05: qty 1

## 2018-01-05 MED ORDER — GADOBENATE DIMEGLUMINE 529 MG/ML IV SOLN
27.0000 mL | Freq: Once | INTRAVENOUS | Status: AC
  Administered 2018-01-05: 27 mL via INTRAVENOUS

## 2018-01-05 MED ORDER — METOPROLOL SUCCINATE ER 25 MG PO TB24: 25.0000 mg | ORAL_TABLET | Freq: Every day | ORAL | 6 refills | Status: DC

## 2018-01-05 MED ORDER — APIXABAN 5 MG PO TABS: 5.0000 mg | ORAL_TABLET | Freq: Two times a day (BID) | ORAL | 0 refills | Status: DC

## 2018-01-05 NOTE — Progress Notes (Signed)
Reviewed cardiac MRI No high risk features. 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 Will not do heavy weight Lifting for 2 weeks. Discussed warning signs of chest pain, pre syncope and palpitations With patient   Evan Vargas Reylene Stauder

## 2018-01-05 NOTE — Progress Notes (Signed)
Progress Note  Patient Name: Evan Vargas Date of Encounter: 01/05/2018  Primary Cardiologist: No primary care provider on file.   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 apixaban  5 mg Oral BID  . azithromycin  500 mg Oral Q1500  . docusate sodium  100 mg Oral BID  . furosemide  40 mg Intravenous BID  . metoprolol tartrate  25 mg Oral TID  . potassium chloride  20 mEq Oral BID   Continuous Infusions: . sodium chloride 20 mL/hr at 01/04/18 2206  . cefTRIAXone (ROCEPHIN)  IV Stopped (01/04/18 1709)   PRN Meds: acetaminophen, levalbuterol, magnesium citrate, ondansetron **OR** ondansetron (ZOFRAN) IV, senna-docusate, sorbitol   Vital Signs    Vitals:   01/04/18 1536 01/04/18 2045 01/04/18 2107 01/05/18 0515  BP: (!) 107/51 (!) 95/36 109/80 110/71  Pulse: (!) 124 (!) 119 (!) 120 63  Resp: (!) 31 19 (!) 22 18  Temp: 98.6 F (37 C) 97.8 F (36.6 C)  98.3 F (36.8 C)  TempSrc: Oral Oral  Oral  SpO2: 96% 93% 91% 98%  Weight:      Height:        Intake/Output Summary (Last 24 hours) at 01/05/2018 0834 Last data filed at 01/05/2018 [id number] Gross per 24 hour  Intake 644 ml  Output 700 ml  Net -56 ml   Filed Weights   01/02/18 1224 01/02/18 1720 01/03/18 0309  Weight: 170 lb (77.1 kg) 186 lb 4.6 oz (84.5 kg) 183 lb 6.4 oz (83.2 kg)    Telemetry    NSR rates 60 - Personally Reviewed  ECG    Atypical flutter non specific ST changes reviewed with Dr Elberta Fortisamnitz - Personally Reviewed  Physical Exam  Black male  GEN: No acute distress.   Neck: No JVD Cardiac: RRR, no murmurs, rubs, or gallops.  Respiratory: Clear to auscultation bilaterally. GI: Soft, nontender, non-distended  MS: No edema; No deformity. Neuro:  Nonfocal  Psych: Normal affect   Labs      Chemistry Recent Labs  Lab 01/02/18 1311 01/02/18 1650 01/04/18 0302 01/05/18 0212  NA 138  --  142 141  K 4.2  --  3.4* 3.9  CL 104  --  104 107  CO2 23  --  25 25  GLUCOSE 129*  --  105* 82  BUN 20  --  30* 31*  CREATININE 1.24 1.22 1.36* 1.31*  CALCIUM 7.7* 7.7* 8.5* 8.4*  PROT 5.7*  --  5.2* 5.1*  ALBUMIN 2.7*  --  2.4* 2.5*  AST 165*  --  107* 73*  ALT 308*  --  226* 197*  ALKPHOS 68  --  79 81  BILITOT 0.3  --  0.4 0.3  GFRNONAA >60 >60 >60 >60  GFRAA >60 >60 >60 >60  ANIONGAP 11  --  13 9     Hematology Recent Labs  Lab 01/03/18 0314 01/04/18 0302 01/05/18 0212  WBC 8.5 8.3 6.6  RBC 4.11* 4.03* 3.88*  HGB 12.4* 12.1* 11.6*  HCT 37.4* 37.4* 36.1*  MCV 91.0 92.8 93.0  MCH 30.2 30.0 29.9  MCHC 33.2 32.4 32.1  RDW 15.1 15.3 15.4  PLT 228 273 295    Cardiac Enzymes Recent Labs  Lab 01/02/18 1650 01/02/18 1851  01/03/18 0314  TROPONINI 0.10* 0.09* 0.08*    Recent Labs  Lab 01/02/18 1320  TROPIPOC 0.07     BNP Recent Labs  Lab 01/02/18 1311 01/02/18 1527  BNP 1,008.6* 1,059.7*     DDimer No results for input(s): DDIMER in the last 168 hours.   Radiology    No results found.  Cardiac Studies   Echo ? Restrictive DCM biatrial enlargement LVH normal EF  Patient Profile     37 y.o. 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  Will need f/u cardiac MRI Tuesday 3/26 after rhythm slowed To further assess myocardium   Assessment & Plan    Flutter converted will lower dose of beta blocker d/c on Toprol 25 mg daily DCM:  Have called MRI to expedite scanning this am now that HR is lower  Can be d/c home after that If there is evidence of infiltrative DCM may need genetic Testing or RV biopsy will arrange outpatient f/u with me   Will keep on NOAC for at least 4 weeks post conversion    For questions or updates, please contact [facility] Please consult [email or web address] for  contact info under Cardiology/STEMI.      Signed, Charlton Haws, MD  01/05/2018, 8:34 AM

## 2018-01-05 NOTE — Telephone Encounter (Signed)
**Note De-identified Evan Vargas Obfuscation** The pt has not been discharged from the hospital yet. Will call tomorrow. 

## 2018-01-05 NOTE — Discharge Summary (Addendum)
Physician Discharge Summary  Evan Vargas ZOX:[id number] DOB: [birth year] DOA: 01/02/2018  PCP: Default, Provider, MD  Admit date: 01/02/2018 Discharge date: 01/05/2018  Admitted From: Home  Disposition:  Home   Recommendations for Outpatient Follow-up:  1. Follow up with Cardiology in 1 week 2. Please obtain repeat CXR to document resolution of infiltrates  Home Health: None  Equipment/Devices: None  Discharge Condition: Good  CODE STATUS: FULL Diet recommendation: Regular  Brief/Interim Summary: Evan Vargas is a 37 yo M with no significant previous history who presents with dyspnea and cough.  Found to have atrial flutter vs junctional tachycardia, bilateral infiltrates on CXR and elevated BNP.      Acute diastolic CHF Atrial flutter versus junctional tachycardia Echocardiogram showed severe concentric hypertrophy, normal systolic function, normal valves.  Treated with metoprolol and started on Eliquis.  TEE planned but self-converted back to Sinus rhythm before discharge.  Cardiac MRI showed no infiltrative process.  Elevated troponin Trend low and flat.  ACS was doubted.    Community acquired pneumonia Patient had fever on admission without leukocytosis.  Started on antibiotics.  CXR infiltrates were ultimately thought to be due to edema rather than infection, and antibiotics were stopped.  Transaminitis Etiology unclear. Hepatitis panel negative.       Discharge Diagnoses:  Principal Problem:   Acute respiratory failure with hypoxia (HCC) Active Problems:   Tachycardia   Community acquired pneumonia   Acute diastolic CHF (congestive heart failure) [facility])    Discharge Instructions  Discharge Instructions    Diet general   Complete by:  As directed    Discharge instructions   Complete by:  As directed    From Dr. Maryfrances Bunnell: You were admitted for an episode of congestive heart failure that is now resolved.  We found on your echocardiogram (ultrasound of the  heart) that your heart was thickened.  The electrical tracing of your heart showed an abnormal rhythm.  The precise cause of this may be clear from your MRI.  Dr. Eden Emms will recommend further testing if needed.  For now: Take apixaban/Eliquis 5 mg twice daily for one month then stop Take metoprolol/Toprol XL 25 mg once daily     Follow up with Norma Fredrickson, PA on Apr 2 and Dr. Eden Emms on Apr 17. Your prescriptions were sent to the [facility] at [address] and [address].  For now, resume light activity, aerobic activity is okay.  Lifting light weights, many reps is okay.   Increase activity slowly   Complete by:  As directed      Allergies as of 01/05/2018   No Known Allergies     Medication List    STOP taking these medications   CREATINE PO   GLUCOSAMINE-CHONDROITIN PO   GLUTAMINE PO   ibuprofen 200 MG tablet Commonly known as:  ADVIL,MOTRIN   MAGNESIUM PO   NYQUIL COLD & FLU PO   OVER THE COUNTER MEDICATION     TAKE these medications   apixaban 5 MG Tabs tablet Commonly known as:  ELIQUIS Take 1 tablet (5 mg total) by mouth 2 (two) times daily.   metoprolol succinate 25 MG 24 hr tablet Commonly known as:  TOPROL-XL Take 1 tablet (25 mg total) by mouth daily. Start taking on:  01/06/2018   multivitamin with minerals Tabs tablet Take 1 tablet by mouth daily.   ZINC PO Take 1 tablet by mouth daily.      Follow-up Information    Rosalio Macadamia, NP Follow up on 01/13/2018.  Specialties:  Nurse Practitioner, Interventional Cardiology, Cardiology, Radiology Why:  Please arrive 15 minutes early for your 11:30am Cardiology appointment Contact information: [address] [place] [address] [phone]        Wendall Stade, MD Follow up on 01/28/2018.   Specialty:  Cardiology Why:  Please arrive 15 minutes early for your 11:00 am Cardiology appointment Contact information: [address] [place]  [address] [phone]          No Known Allergies  Consultations:  Cardiology   Procedures/Studies: Dg Chest 2 View  Result Date: 01/02/2018 CLINICAL DATA:  Short of breath and chest pain EXAM: CHEST - 2 VIEW COMPARISON:  None. FINDINGS: Bilateral perihilar airspace disease is symmetric and most consistent with edema. Small pleural effusions. Heart size upper normal. Mediastinal contours normal. No mass lesion. IMPRESSION: Perihilar airspace disease bilaterally most likely edema. Electronically Signed   By: Marlan Palau M.D.   On: 01/02/2018 12:43   Ct Angio Chest Pe W And/or Wo Contrast  Result Date: 01/02/2018 CLINICAL DATA:  Shortness of breath, recently completed body building competition EXAM: CT ANGIOGRAPHY CHEST WITH CONTRAST TECHNIQUE: Multidetector CT imaging of the chest was performed using the standard protocol during bolus administration of intravenous contrast. Multiplanar CT image reconstructions and MIPs were obtained to evaluate the vascular anatomy. CONTRAST:  81mL ISOVUE-370 IOPAMIDOL (ISOVUE-370) INJECTION 76% IV COMPARISON:  None FINDINGS: Cardiovascular: Aorta normal caliber without aneurysm or dissection. No pericardial effusion. Pulmonary arteries adequately opacified and patent. Respiratory motion artifacts at the lower lobes. No evidence of pulmonary embolism. Mediastinum/Nodes: No thoracic adenopathy. Esophagus unremarkable. Base of cervical region normal appearance. Lungs/Pleura: Patchy airspace infiltrates identified throughout all lobes question edema versus multifocal pneumonia. Small BILATERAL pleural effusions. No pneumothorax. No endobronchial lesions. Upper Abdomen: Unremarkable Musculoskeletal: Normal appearance Review of the MIP images confirms the above findings. IMPRESSION: No evidence pulmonary embolism. Small BILATERAL pleural effusions with patchy BILATERAL airspace infiltrates centrally throughout both lungs question pulmonary edema versus multifocal  pneumonia. Electronically Signed   By: Ulyses Southward M.D.   On: 01/02/2018 14:53   Mr Cardiac Morphology W Wo Contrast  Result Date: 01/05/2018 CLINICAL DATA:  ?  Restrictive Cardiomyopathy EXAM: CARDIAC MRI TECHNIQUE: The patient was scanned on a 1.5 Tesla GE magnet. A dedicated cardiac coil was used. Functional imaging was done using Fiesta sequences. 2,3, and 4 chamber views were done to assess for RWMA's. Modified Simpson's rule using a short axis stack was used to calculate an ejection fraction on a dedicated work [employer]. The patient received Multihance. After 10 minutes inversion recovery sequences were used to assess for infiltration and scar tissue. CONTRAST:  Multihance FINDINGS: There was moderate LAE and mild RAE. The RV was normal in size and function Valves were normal with no SAM or LVOT turbulence There was no ASD/VSD. The aortic root was normal. There was a Trivial posterior pericardial effusion. There was severe LVH septal thickness 17 mm. In systole ventricle was spade like in appearance. Delayed enhancement images with gadolinium showed no uptake No infiltration, infarct or evidence of abnormal myocardium IMPRESSION: 1. Severe LVH septal thickness 17 mm with quantitative EF 68% (EDV 90 cc ESV 28 cc SV 61 cc) 2.  No LVOT gradient or SAM 3.  No delayed gadolinium uptake in myocardium 4. 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 ECG is  normal and no gadolinium uptake. These would suggest low risk for arrhythmia Charlton Haws Electronically Signed   By: Charlton Haws M.D.   On: 01/05/2018 13:46       Subjective: Feels well.  Good appetite.  No new fever, cough, dyspnea, exertional symptoms, leg swelling, confusion, weakness.  Did not feel when he converted back to sinus.   Discharge Exam: Vitals:   01/05/18 0515 01/05/18 1141  BP: 110/71 112/74  Pulse: 63 90   Resp: 18   Temp: 98.3 F (36.8 C) 98.3 F (36.8 C)  SpO2: 98%    Vitals:   01/04/18 2045 01/04/18 2107 01/05/18 0515 01/05/18 1141  BP: (!) 95/36 109/80 110/71 112/74  Pulse: (!) 119 (!) 120 63 90  Resp: 19 (!) 22 18   Temp: 97.8 F (36.6 C)  98.3 F (36.8 C) 98.3 F (36.8 C)  TempSrc: Oral  Oral Oral  SpO2: 93% 91% 98%   Weight:      Height:        General: Pt is alert, awake, not in acute distress Cardiovascular: RRR, S1/S2 +, S4 noted, no rubs, no gallops Respiratory: CTA bilaterally, no wheezing, no rhonchi Abdominal: Soft, NT, ND, bowel sounds + Extremities: no edema, no cyanosis    The results of significant diagnostics from this hospitalization (including imaging, microbiology, ancillary and laboratory) are listed below for reference.     Microbiology: Recent Results (from the past 240 hour(s))  Blood culture (routine x 2)     Status: None (Preliminary result)   Collection Time: 01/02/18  2:57 PM  Result Value Ref Range Status   Specimen Description BLOOD LEFT ANTECUBITAL  Final   Special Requests   Final    IN PEDIATRIC BOTTLE Blood Culture results may not be optimal due to an excessive volume of blood received in culture bottles   Culture   Final    NO GROWTH 3 DAYS Performed at [facility], [address] [place], [place] [address]    Report Status PENDING  Incomplete  Blood culture (routine x 2)     Status: None (Preliminary result)   Collection Time: 01/02/18  3:02 PM  Result Value Ref Range Status   Specimen Description BLOOD LEFT ANTECUBITAL  Final   Special Requests   Final    BOTTLES DRAWN AEROBIC AND ANAEROBIC Blood Culture adequate volume   Culture   Final    NO GROWTH 3 DAYS Performed at [facility], [address], [place], [place] [address]    Report Status PENDING  Incomplete  Urine culture     Status: Abnormal   Collection Time: 01/02/18  4:19 PM  Result Value Ref Range Status   Specimen Description URINE, RANDOM   Final   Special Requests NONE  Final   Culture (A)  Final    <10,000 COLONIES/mL INSIGNIFICANT GROWTH Performed at [facility], [address] [place], [place] [address]    Report Status 01/03/2018 FINAL  Final     Labs: BNP (last 3 results) Recent Labs    01/02/18 1311 01/02/18 1527  BNP 1,008.6* 1,059.7*   Basic Metabolic Panel: Recent Labs  Lab 01/02/18 1311 01/02/18 1650 01/04/18 0302 01/05/18 0212  NA 138  --  142 141  K 4.2  --  3.4* 3.9  CL 104  --  104 107  CO2 23  --  25 25  GLUCOSE 129*  --  105* 82  BUN 20  --  30* 31*  CREATININE 1.24 1.22 1.36* 1.31*  CALCIUM 7.7* 7.7* 8.5* 8.4*  MG  --  2.2  --   --   PHOS  --  3.0  --   --    Liver Function Tests: Recent Labs  Lab 01/02/18 1311 01/04/18 0302 01/05/18 0212  AST 165* 107* 73*  ALT 308* 226* 197*  ALKPHOS 68 79 81  BILITOT 0.3 0.4 0.3  PROT 5.7* 5.2* 5.1*  ALBUMIN 2.7* 2.4* 2.5*   No results for input(s): LIPASE, AMYLASE in the last 168 hours. No results for input(s): AMMONIA in the last 168 hours. CBC: Recent Labs  Lab 01/02/18 1311 01/02/18 1650 01/03/18 0314 01/04/18 0302 01/05/18 0212  WBC 7.8 6.5 8.5 8.3 6.6  NEUTROABS 6.0  --   --   --   --   HGB 12.8* 12.5* 12.4* 12.1* 11.6*  HCT 38.2* 37.6* 37.4* 37.4* 36.1*  MCV 91.8 90.8 91.0 92.8 93.0  PLT 188 209 228 273 295   Cardiac Enzymes: Recent Labs  Lab 01/02/18 1312 01/02/18 1650 01/02/18 1851 01/03/18 0314  CKTOTAL 355  --  335  --   CKMB  --   --  5.2*  --   TROPONINI  --  0.10* 0.09* 0.08*   BNP: Invalid input(s): POCBNP CBG: Recent Labs  Lab 01/03/18 0841 01/04/18 0609  GLUCAP 115* 114*   D-Dimer No results for input(s): DDIMER in the last 72 hours. Hgb A1c Recent Labs    01/02/18 1849  HGBA1C 5.9*   Lipid Profile No results for input(s): CHOL, HDL, LDLCALC, TRIG, CHOLHDL, LDLDIRECT in the last 72 hours. Thyroid function studies Recent Labs    01/02/18 1849  TSH 0.511   Anemia work up No  results for input(s): VITAMINB12, FOLATE, FERRITIN, TIBC, IRON, RETICCTPCT in the last 72 hours. Urinalysis    Component Value Date/Time   COLORURINE COLORLESS (A) 01/02/2018 1527   APPEARANCEUR CLEAR 01/02/2018 1527   LABSPEC 1.011 01/02/2018 1527   PHURINE 7.0 01/02/2018 1527   GLUCOSEU NEGATIVE 01/02/2018 1527   HGBUR NEGATIVE 01/02/2018 1527   BILIRUBINUR NEGATIVE 01/02/2018 1527   KETONESUR NEGATIVE 01/02/2018 1527   PROTEINUR NEGATIVE 01/02/2018 1527   NITRITE NEGATIVE 01/02/2018 1527   LEUKOCYTESUR NEGATIVE 01/02/2018 1527   Sepsis Labs Invalid input(s): PROCALCITONIN,  WBC,  LACTICIDVEN Microbiology Recent Results (from the past 240 hour(s))  Blood culture (routine x 2)     Status: None (Preliminary result)   Collection Time: 01/02/18  2:57 PM  Result Value Ref Range Status   Specimen Description BLOOD LEFT ANTECUBITAL  Final   Special Requests   Final    IN PEDIATRIC BOTTLE Blood Culture results may not be optimal due to an excessive volume of blood received in culture bottles   Culture   Final    NO GROWTH 3 DAYS Performed at [facility], [address], [place], [place] [address]    Report Status PENDING  Incomplete  Blood culture (routine x 2)     Status: None (Preliminary result)   Collection Time: 01/02/18  3:02 PM  Result Value Ref Range Status   Specimen Description BLOOD LEFT ANTECUBITAL  Final   Special Requests   Final    BOTTLES DRAWN AEROBIC AND ANAEROBIC Blood Culture adequate volume   Culture   Final    NO GROWTH 3 DAYS Performed at [facility], [address] [place], [place] [address]    Report Status PENDING  Incomplete  Urine culture     Status: Abnormal   Collection Time: 01/02/18  4:19 PM  Result Value Ref Range Status   Specimen Description URINE, RANDOM  Final   Special Requests NONE  Final   Culture (A)  Final    <10,000 COLONIES/mL INSIGNIFICANT GROWTH Performed at [facility], [address] [place], [place]  [id number]    Report Status 01/03/2018 FINAL  Final     Time coordinating discharge: Over 30 minutes  SIGNED:   Alberteen Samhristopher P Mickayla Trouten, MD  [facility] 01/05/2018, 3:34 PM

## 2018-01-05 NOTE — Progress Notes (Signed)
Dr. Rennis GoldenHilty notified that patient is in NSR. TEE/cardioversion is cancelled.

## 2018-01-05 NOTE — Telephone Encounter (Signed)
Patient scheduled for a TOC appt with Norma FredricksonLori Gerhardt 01-13-18 @ 11:30 am

## 2018-01-05 NOTE — Telephone Encounter (Addendum)
-----   Message from Wendall StadePeter C Nishan, MD sent at 01/05/2018  2:21 PM EDT ----- Forward this to the genetics person affiliated with our group and see if we can set up a consult with her I think she sees people in our office on occasion. Needs consult and Possible genetic testing for hypertrophic cardiomyopathy    Put in referral. Will send message to Dr. Jomarie LongsJoseph scheduler to call patient to schedule.

## 2018-01-05 NOTE — Progress Notes (Signed)
Patient converted to [name] 72 Will get EKG done to confirm

## 2018-01-06 NOTE — Telephone Encounter (Addendum)
1st attempt - LMTCB

## 2018-01-07 LAB — CULTURE, BLOOD (ROUTINE X 2)
Culture: NO GROWTH
Culture: NO GROWTH
SPECIAL REQUESTS: ADEQUATE

## 2018-01-07 NOTE — Telephone Encounter (Signed)
**Note De-identified Roselynne Lortz Obfuscation** 2nd attempt - LMTCB

## 2018-01-13 ENCOUNTER — Ambulatory Visit: Payer: Self-pay | Admitting: Nurse Practitioner

## 2018-01-13 NOTE — Progress Notes (Deleted)
CARDIOLOGY OFFICE NOTE  Date:  01/13/2018    Gretta BeganJorral Hamidi Date of Birth: 1981/08/17 Medical Record #[id number]  PCP:  Default, Provider, MD  Cardiologist:  Tyrone SageGerhardt & ***    No chief complaint on file.   History of Present Illness: Jiles HaroldJorral Perlie GoldRussell is a 37 y.o. male who presents today for a ***   Comes in today. Here with   No past medical history on file.  Past Surgical History:  Procedure Laterality Date  . ANTERIOR CRUCIATE LIGAMENT REPAIR     left  . ANTERIOR CRUCIATE LIGAMENT REPAIR  2004     Medications: No outpatient medications have been marked as taking for the 01/13/18 encounter (Appointment) with Rosalio MacadamiaGerhardt, Satoria Dunlop C, NP.     Allergies: No Known Allergies  Social History: The patient  reports that he has never smoked. He has never used smokeless tobacco. He reports that he does not drink alcohol or use drugs.   Family History: The patient's ***family history includes Heart disease in his maternal grandmother.   Review of Systems: Please see the history of present illness.   Otherwise, the review of systems is positive for {NONE DEFAULTED:18576::"none"}.   All other systems are reviewed and negative.   Physical Exam: VS:  There were no vitals taken for this visit. Marland Kitchen.  BMI There is no height or weight on file to calculate BMI.  Wt Readings from Last 3 Encounters:  01/03/18 183 lb 6.4 oz (83.2 kg)    General: Pleasant. Well developed, well nourished and in no acute distress.   HEENT: Normal.  Neck: Supple, no JVD, carotid bruits, or masses noted.  Cardiac: ***Regular rate and rhythm. No murmurs, rubs, or gallops. No edema.  Respiratory:  Lungs are clear to auscultation bilaterally with normal work of breathing.  GI: Soft and nontender.  MS: No deformity or atrophy. Gait and ROM intact.  Skin: Warm and dry. Color is normal.  Neuro:  Strength and sensation are intact and no gross focal deficits noted.  Psych: Alert, appropriate and with normal  affect.   LABORATORY DATA:  EKG:  EKG {ACTION; IS/IS [id number]}OT:[id number]} ordered today. This demonstrates ***.  Lab Results  Component Value Date   WBC 6.6 01/05/2018   HGB 11.6 (L) 01/05/2018   HCT 36.1 (L) 01/05/2018   PLT 295 01/05/2018   GLUCOSE 82 01/05/2018   ALT 197 (H) 01/05/2018   AST 73 (H) 01/05/2018   NA 141 01/05/2018   K 3.9 01/05/2018   CL 107 01/05/2018   CREATININE 1.31 (H) 01/05/2018   BUN 31 (H) 01/05/2018   CO2 25 01/05/2018   TSH 0.511 01/02/2018   INR 1.06 01/03/2018   HGBA1C 5.9 (H) 01/02/2018     BNP (last 3 results) Recent Labs    01/02/18 1311 01/02/18 1527  BNP 1,008.6* 1,059.7*    ProBNP (last 3 results) No results for input(s): PROBNP in the last 8760 hours.   Other Studies Reviewed Today:   Assessment/Plan:   Current medicines are reviewed with the patient today.  The patient does not have concerns regarding medicines other than what has been noted above.  The following changes have been made:  See above.  Labs/ tests ordered today include:   No orders of the defined types were placed in this encounter.    Disposition:   FU with *** in {gen number [id number]}0-10:[id number]} {Days to years:10300}.   Patient is agreeable to this plan and will call if any problems develop in the  interim.   SignedNorma Fredrickson, NP  01/13/2018 7:51 AM  [facility] [address] [place], [place]  [address] Phone: [phone] Fax: [phone]

## 2018-01-14 ENCOUNTER — Encounter: Payer: Self-pay | Admitting: Nurse Practitioner

## 2018-01-20 NOTE — Progress Notes (Deleted)
Cardiology Office Note   Date:  01/20/2018   ID:  Evan Vargas, DOB 03/03/1981, MRN [id number]  PCP:  Default, Provider, MD  Cardiologist:   Charlton Haws, MD   No chief complaint on file.     History of Present Illness: Evan Vargas is a 37 y.o. male who presents for post hospital f/u. New diagnosis of DCM with acute diastolic  CHF. Admitted 01/02/18 with cough , dyspnea and palpitations. Echo showed severe bi atrial enlargement with restictive Looking picture. Severe LVH EF 65-70% severe TR. 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  Past Surgical History:  Procedure Laterality Date  . ANTERIOR CRUCIATE LIGAMENT REPAIR     left  . ANTERIOR CRUCIATE LIGAMENT REPAIR  2004     Current Outpatient Medications  Medication Sig Dispense Refill  . apixaban (ELIQUIS) 5 MG TABS tablet Take 1 tablet (5 mg total) by mouth 2 (two) times daily. 60 tablet 0  . metoprolol succinate (TOPROL-XL) 25 MG 24 hr tablet Take 1 tablet (25 mg total) by mouth daily. 30 tablet 6  . Multiple Vitamin (MULTIVITAMIN WITH MINERALS) TABS tablet Take 1 tablet by mouth daily.    . Multiple Vitamins-Minerals (ZINC PO) Take 1 tablet by mouth daily.     No current facility-administered medications for this visit.     Allergies:   Patient has no known allergies.    Social History:  The patient  reports that he has never smoked. He has never used smokeless tobacco. He reports that he does not drink alcohol or use drugs.   Family History:  The  patient's family history includes Heart disease in his maternal grandmother.    ROS:  Please see the history of present illness.   Otherwise, review of systems are positive for {NONE DEFAULTED:18576::"none"}.   All other systems are reviewed and negative.    PHYSICAL EXAM: VS:  There were no vitals taken for this visit. , BMI There is no height or weight on file to calculate BMI. Affect appropriate Healthy:  appears stated age HEENT: normal Neck supple with no adenopathy JVP normal no bruits no thyromegaly Lungs clear with no wheezing and good diaphragmatic motion Heart:  S1/S2 no murmur, no rub, gallop or click PMI normal Abdomen: benighn, BS positve, no tenderness, no AAA no bruit.  No HSM or HJR Distal pulses intact with no bruits No edema Neuro non-focal Skin warm and dry No muscular weakness    EKG:  SR rate 77 RAE voltage LVH    Recent Labs: 01/02/2018: B Natriuretic Peptide 1,059.7; Magnesium 2.2; TSH 0.511 01/05/2018: ALT 197; BUN 31; Creatinine, Ser 1.31; Hemoglobin 11.6; Platelets 295; Potassium 3.9; Sodium 141    Lipid Panel No results found for: CHOL, TRIG, HDL, CHOLHDL, VLDL, LDLCALC, LDLDIRECT    Wt Readings from Last 3 Encounters:  01/03/18 183 lb 6.4 oz (83.2 kg)      Other studies Reviewed: Additional studies/ records that were reviewed today include: Hospital records March including labs, CXR cardiac MRI,  echo And telemetry strips.    ASSESSMENT AND PLAN:  1.  Cardiomyopathy:  *** 2. Atrial Arrhythmia- flutter 3. HTN 4. Anticoagulation    Current medicines are reviewed at length with the patient today.  The patient does not have concerns regarding medicines.  The following changes have been made:  ***  Labs/ tests ordered today include: *** No orders of the defined types were placed in this encounter.    Disposition:   FU with me in 6 months      Signed, Charlton HawsPeter Byrd Rushlow, MD  01/20/2018 5:05 PM    [facility] [address], [place], [place]  [address] Phone: [phone]; Fax: [phone]

## 2018-01-28 ENCOUNTER — Ambulatory Visit: Payer: Self-pay | Admitting: Cardiovascular Disease

## 2018-01-29 ENCOUNTER — Encounter: Payer: Self-pay | Admitting: Cardiovascular Disease

## 2018-02-05 ENCOUNTER — Encounter: Payer: Self-pay | Admitting: Genetic Counselor

## 2018-02-25 ENCOUNTER — Encounter: Payer: Self-pay | Admitting: Genetic Counselor

## 2021-01-31 ENCOUNTER — Other Ambulatory Visit: Payer: Self-pay

## 2021-01-31 ENCOUNTER — Inpatient Hospital Stay (HOSPITAL_COMMUNITY)
Admission: EM | Admit: 2021-01-31 | Discharge: 2021-02-14 | DRG: 673 | Disposition: A | Discharge status: Home / Self Care | Payer: Self-pay | Attending: Internal Medicine | Admitting: Internal Medicine

## 2021-01-31 ENCOUNTER — Emergency Department (HOSPITAL_COMMUNITY): Payer: Self-pay

## 2021-01-31 ENCOUNTER — Encounter (HOSPITAL_COMMUNITY): Payer: Self-pay

## 2021-01-31 DIAGNOSIS — D649 Anemia, unspecified: Secondary | ICD-10-CM | POA: Diagnosis present

## 2021-01-31 DIAGNOSIS — R748 Abnormal levels of other serum enzymes: Secondary | ICD-10-CM

## 2021-01-31 DIAGNOSIS — K7200 Acute and subacute hepatic failure without coma: Secondary | ICD-10-CM | POA: Diagnosis present

## 2021-01-31 DIAGNOSIS — R7401 Elevation of levels of liver transaminase levels: Secondary | ICD-10-CM

## 2021-01-31 DIAGNOSIS — I480 Paroxysmal atrial fibrillation: Secondary | ICD-10-CM

## 2021-01-31 DIAGNOSIS — I422 Other hypertrophic cardiomyopathy: Secondary | ICD-10-CM | POA: Diagnosis present

## 2021-01-31 DIAGNOSIS — E669 Obesity, unspecified: Secondary | ICD-10-CM | POA: Diagnosis present

## 2021-01-31 DIAGNOSIS — N170 Acute kidney failure with tubular necrosis: Principal | ICD-10-CM | POA: Diagnosis present

## 2021-01-31 DIAGNOSIS — N179 Acute kidney failure, unspecified: Secondary | ICD-10-CM

## 2021-01-31 DIAGNOSIS — R7989 Other specified abnormal findings of blood chemistry: Secondary | ICD-10-CM | POA: Diagnosis present

## 2021-01-31 DIAGNOSIS — I5032 Chronic diastolic (congestive) heart failure: Secondary | ICD-10-CM | POA: Diagnosis present

## 2021-01-31 DIAGNOSIS — Z6831 Body mass index (BMI) 31.0-31.9, adult: Secondary | ICD-10-CM

## 2021-01-31 DIAGNOSIS — E875 Hyperkalemia: Secondary | ICD-10-CM

## 2021-01-31 DIAGNOSIS — E8729 Other acidosis: Secondary | ICD-10-CM | POA: Diagnosis present

## 2021-01-31 DIAGNOSIS — Z20822 Contact with and (suspected) exposure to covid-19: Secondary | ICD-10-CM | POA: Diagnosis present

## 2021-01-31 DIAGNOSIS — E872 Acidosis: Secondary | ICD-10-CM

## 2021-01-31 DIAGNOSIS — E860 Dehydration: Secondary | ICD-10-CM | POA: Diagnosis present

## 2021-01-31 DIAGNOSIS — R188 Other ascites: Secondary | ICD-10-CM | POA: Diagnosis present

## 2021-01-31 DIAGNOSIS — E861 Hypovolemia: Secondary | ICD-10-CM | POA: Diagnosis present

## 2021-01-31 DIAGNOSIS — M6282 Rhabdomyolysis: Secondary | ICD-10-CM | POA: Diagnosis present

## 2021-01-31 DIAGNOSIS — K828 Other specified diseases of gallbladder: Secondary | ICD-10-CM | POA: Diagnosis present

## 2021-01-31 DIAGNOSIS — R079 Chest pain, unspecified: Secondary | ICD-10-CM

## 2021-01-31 DIAGNOSIS — K760 Fatty (change of) liver, not elsewhere classified: Secondary | ICD-10-CM | POA: Diagnosis present

## 2021-01-31 DIAGNOSIS — Z79899 Other long term (current) drug therapy: Secondary | ICD-10-CM

## 2021-01-31 DIAGNOSIS — R197 Diarrhea, unspecified: Secondary | ICD-10-CM

## 2021-01-31 DIAGNOSIS — D696 Thrombocytopenia, unspecified: Secondary | ICD-10-CM | POA: Diagnosis present

## 2021-01-31 DIAGNOSIS — Z8249 Family history of ischemic heart disease and other diseases of the circulatory system: Secondary | ICD-10-CM

## 2021-01-31 DIAGNOSIS — R112 Nausea with vomiting, unspecified: Secondary | ICD-10-CM

## 2021-01-31 DIAGNOSIS — R001 Bradycardia, unspecified: Secondary | ICD-10-CM

## 2021-01-31 HISTORY — DX: Paroxysmal atrial fibrillation: I48.0

## 2021-01-31 LAB — URINALYSIS, ROUTINE W REFLEX MICROSCOPIC
Bilirubin Urine: NEGATIVE
Glucose, UA: 50 mg/dL — AB
Ketones, ur: NEGATIVE mg/dL
Nitrite: NEGATIVE
Protein, ur: 100 mg/dL — AB
Specific Gravity, Urine: 1.011 (ref 1.005–1.030)
pH: 5 (ref 5.0–8.0)

## 2021-01-31 LAB — COMPREHENSIVE METABOLIC PANEL
ALT: 5044 U/L — ABNORMAL HIGH (ref 0–44)
ALT: 4146 U/L — ABNORMAL HIGH (ref 0–44)
AST: 1427 U/L — ABNORMAL HIGH (ref 15–41)
AST: 1188 U/L — ABNORMAL HIGH (ref 15–41)
Albumin: 4 g/dL (ref 3.5–5.0)
Albumin: 3.8 g/dL (ref 3.5–5.0)
Alkaline Phosphatase: 133 U/L — ABNORMAL HIGH (ref 38–126)
Alkaline Phosphatase: 121 U/L (ref 38–126)
Anion gap: 24 — ABNORMAL HIGH (ref 5–15)
Anion gap: 21 — ABNORMAL HIGH (ref 5–15)
BUN: 171 mg/dL — ABNORMAL HIGH (ref 6–20)
BUN: 163 mg/dL — ABNORMAL HIGH (ref 6–20)
CO2: 14 mmol/L — ABNORMAL LOW (ref 22–32)
CO2: 14 mmol/L — ABNORMAL LOW (ref 22–32)
Calcium: 7.9 mg/dL — ABNORMAL LOW (ref 8.9–10.3)
Calcium: 7.3 mg/dL — ABNORMAL LOW (ref 8.9–10.3)
Chloride: 102 mmol/L (ref 98–111)
Chloride: 102 mmol/L (ref 98–111)
Creatinine, Ser: 13.87 mg/dL — ABNORMAL HIGH (ref 0.61–1.24)
Creatinine, Ser: 13.71 mg/dL — ABNORMAL HIGH (ref 0.61–1.24)
GFR, Estimated: 4 mL/min — ABNORMAL LOW (ref >60)
GFR, Estimated: 4 mL/min — ABNORMAL LOW (ref >60)
Glucose, Bld: 114 mg/dL — ABNORMAL HIGH (ref 70–99)
Glucose, Bld: 111 mg/dL — ABNORMAL HIGH (ref 70–99)
Potassium: 6.6 mmol/L (ref 3.5–5.1)
Potassium: 7.4 mmol/L (ref 3.5–5.1)
Sodium: 140 mmol/L (ref 135–145)
Sodium: 137 mmol/L (ref 135–145)
Total Bilirubin: 9.7 mg/dL — ABNORMAL HIGH (ref 0.3–1.2)
Total Bilirubin: 9.1 mg/dL — ABNORMAL HIGH (ref 0.3–1.2)
Total Protein: 6.5 g/dL (ref 6.5–8.1)
Total Protein: 6 g/dL — ABNORMAL LOW (ref 6.5–8.1)

## 2021-01-31 LAB — HEPATITIS PANEL, ACUTE
HCV Ab: NONREACTIVE
Hep A IgM: NONREACTIVE
Hep B C IgM: NONREACTIVE
Hepatitis B Surface Ag: NONREACTIVE

## 2021-01-31 LAB — CBC
HCT: 39.8 % (ref 39.0–52.0)
Hemoglobin: 13.8 g/dL (ref 13.0–17.0)
MCH: 31.4 pg (ref 26.0–34.0)
MCHC: 34.7 g/dL (ref 30.0–36.0)
MCV: 90.5 fL (ref 80.0–100.0)
Platelets: 134 10*3/uL — ABNORMAL LOW (ref 150–400)
RBC: 4.4 MIL/uL (ref 4.22–5.81)
RDW: 17.7 % — ABNORMAL HIGH (ref 11.5–15.5)
WBC: 6.7 10*3/uL (ref 4.0–10.5)
nRBC: 0.6 % — ABNORMAL HIGH (ref 0.0–0.2)

## 2021-01-31 LAB — SODIUM, URINE, RANDOM: Sodium, Ur: 57 mmol/L

## 2021-01-31 LAB — RAPID URINE DRUG SCREEN, HOSP PERFORMED
Amphetamines: NOT DETECTED
Barbiturates: NOT DETECTED
Benzodiazepines: NOT DETECTED
Cocaine: NOT DETECTED
Opiates: NOT DETECTED
Tetrahydrocannabinol: NOT DETECTED

## 2021-01-31 LAB — PROTIME-INR
INR: 1.1 (ref 0.8–1.2)
Prothrombin Time: 14.2 seconds (ref 11.4–15.2)

## 2021-01-31 LAB — LIPASE, BLOOD: Lipase: 30 U/L (ref 11–51)

## 2021-01-31 LAB — CK: Total CK: 571 U/L — ABNORMAL HIGH (ref 49–397)

## 2021-01-31 LAB — ACETAMINOPHEN LEVEL: Acetaminophen (Tylenol), Serum: <10 ug/mL — ABNORMAL LOW (ref 10–30)

## 2021-01-31 LAB — RESP PANEL BY RT-PCR (FLU A&B, COVID) ARPGX2
Influenza A by PCR: NEGATIVE
Influenza B by PCR: NEGATIVE
SARS Coronavirus 2 by RT PCR: NEGATIVE

## 2021-01-31 LAB — CREATININE, URINE, RANDOM: Creatinine, Urine: 82.15 mg/dL

## 2021-01-31 LAB — AMMONIA: Ammonia: 32 umol/L (ref 9–35)

## 2021-01-31 LAB — SALICYLATE LEVEL: Salicylate Lvl: <7 mg/dL — ABNORMAL LOW (ref 7.0–30.0)

## 2021-01-31 LAB — MAGNESIUM: Magnesium: 3 mg/dL — ABNORMAL HIGH (ref 1.7–2.4)

## 2021-01-31 MED ORDER — SODIUM CHLORIDE 0.9 % IV BOLUS
1000.0000 mL | Freq: Once | INTRAVENOUS | Status: AC
  Administered 2021-01-31: 1000 mL via INTRAVENOUS

## 2021-01-31 MED ORDER — SODIUM CHLORIDE 0.9 % IV SOLN: INTRAVENOUS | Status: DC

## 2021-01-31 MED ORDER — CALCIUM GLUCONATE-NACL 1-0.675 GM/50ML-% IV SOLN
1.0000 g | Freq: Once | INTRAVENOUS | Status: AC
  Administered 2021-01-31: 1000 mg via INTRAVENOUS
  Filled 2021-01-31: qty 50

## 2021-01-31 MED ORDER — ALBUTEROL SULFATE (2.5 MG/3ML) 0.083% IN NEBU
10.0000 mg | INHALATION_SOLUTION | Freq: Once | RESPIRATORY_TRACT | Status: AC
  Administered 2021-01-31: 10 mg via RESPIRATORY_TRACT
  Filled 2021-01-31: qty 12

## 2021-01-31 MED ORDER — INSULIN ASPART 100 UNIT/ML IV SOLN
5.0000 [IU] | Freq: Once | INTRAVENOUS | Status: AC
  Administered 2021-01-31: 5 [IU] via INTRAVENOUS
  Filled 2021-01-31: qty 0.05

## 2021-01-31 MED ORDER — MORPHINE SULFATE (PF) 4 MG/ML IV SOLN
4.0000 mg | Freq: Once | INTRAVENOUS | Status: AC
  Administered 2021-01-31: 4 mg via INTRAVENOUS
  Filled 2021-01-31 (×2): qty 1

## 2021-01-31 MED ORDER — HYDROMORPHONE HCL 1 MG/ML IJ SOLN
0.5000 mg | INTRAMUSCULAR | Status: DC | PRN
  Administered 2021-02-02 – 2021-02-11 (×7): 0.5 mg via INTRAVENOUS
  Filled 2021-01-31 (×8): qty 1

## 2021-01-31 MED ORDER — ACETAMINOPHEN 325 MG PO TABS
650.0000 mg | ORAL_TABLET | Freq: Four times a day (QID) | ORAL | Status: DC | PRN
  Administered 2021-02-01: 650 mg via ORAL
  Filled 2021-01-31: qty 2

## 2021-01-31 MED ORDER — LORAZEPAM 2 MG/ML IJ SOLN: 0.5000 mg | Freq: Four times a day (QID) | INTRAMUSCULAR | Status: DC | PRN

## 2021-01-31 MED ORDER — SODIUM BICARBONATE 8.4 % IV SOLN
50.0000 meq | Freq: Once | INTRAVENOUS | Status: AC
  Administered 2021-01-31: 50 meq via INTRAVENOUS
  Filled 2021-01-31: qty 50

## 2021-01-31 MED ORDER — ACETAMINOPHEN 650 MG RE SUPP: 650.0000 mg | Freq: Four times a day (QID) | RECTAL | Status: DC | PRN

## 2021-01-31 MED ORDER — SODIUM ZIRCONIUM CYCLOSILICATE 10 G PO PACK
10.0000 g | PACK | Freq: Once | ORAL | Status: AC
  Administered 2021-01-31: 10 g via ORAL
  Filled 2021-01-31: qty 1

## 2021-01-31 MED ORDER — DEXTROSE 50 % IV SOLN
1.0000 | Freq: Once | INTRAVENOUS | Status: AC
  Administered 2021-01-31: 50 mL via INTRAVENOUS
  Filled 2021-01-31: qty 50

## 2021-01-31 MED ORDER — ONDANSETRON HCL 4 MG/2ML IJ SOLN
4.0000 mg | Freq: Once | INTRAMUSCULAR | Status: AC
  Administered 2021-01-31: 4 mg via INTRAVENOUS
  Filled 2021-01-31: qty 2

## 2021-01-31 MED ORDER — SODIUM BICARBONATE 8.4 % IV SOLN
INTRAVENOUS | Status: DC
  Filled 2021-01-31: qty 150
  Filled 2021-01-31 (×3): qty 1000

## 2021-01-31 NOTE — ED Notes (Signed)
Report given to IP RN at [facility]).

## 2021-01-31 NOTE — ED Notes (Signed)
Pt refusing foley catheter, will have MD speak with the pt.  Pt voided in restroom to provide urine sample.

## 2021-01-31 NOTE — ED Notes (Signed)
Pt refusing foley catheter

## 2021-01-31 NOTE — ED Notes (Signed)
MD at bedside talking with pt about inserting foley catheter.

## 2021-01-31 NOTE — ED Notes (Signed)
[facility] team at bedside to transport pt to [facility].

## 2021-01-31 NOTE — ED Provider Notes (Signed)
[facility]-EMERGENCY DEPT Provider Note   CSN: [id number] Arrival date & time: 01/31/21  1605     History No chief complaint on file.   Evan Vargas is a 40 y.o. male.  Pt presents to the ED today with n/v/d and abdominal pain.  Pt said sx started early on 4/17.  He has been unable to keep down any fluids.  Pt denies any fevers, but has had chills.  No cp or sob.  Pt tells me he has no pmhx; however, I see he was admitted in 2019 with aflutter, chf and possible hypertrophic cardiomyopathy.  He has not followed up with anyone since that admission and is not taking the Eliquis and metoprolol.  He converted to nsr spontaneously and didn't require cardioversion.  Pt's mom said pt was in a body building show over the weekend when sx started.  Pt takes creatine and glutamine supplements.  Pt denies taking more than 1 or 2 tylenols or ibuprofen.        Past Medical History:  Diagnosis Date  . Paroxysmal atrial fibrillation (HCC)    dx'ed in 2019    Patient Active Problem List   Diagnosis Date Noted  . AKI (acute kidney injury) (HCC) 01/31/2021  . Acute diastolic CHF (congestive heart failure) (HCC) 01/03/2018  . Acute respiratory failure with hypoxia (HCC) 01/03/2018  . Tachycardia 01/02/2018  . Community acquired pneumonia 01/02/2018    Past Surgical History:  Procedure Laterality Date  . ANTERIOR CRUCIATE LIGAMENT REPAIR     left  . ANTERIOR CRUCIATE LIGAMENT REPAIR  2004       Family History  Problem Relation Age of Onset  . Heart disease Maternal Grandmother     Social History   Tobacco Use  . Smoking status: Never Smoker  . Smokeless tobacco: Never Used  Substance Use Topics  . Alcohol use: No  . Drug use: No    Home Medications Prior to Admission medications   Medication Sig Start Date End Date Taking? Authorizing Provider  acetaminophen (TYLENOL) 500 MG tablet Take 1,000 mg by mouth every 6 (six) hours as needed for moderate  pain.   Yes [provider]  Creatine POWD Take 1 Scoop by mouth daily.   Yes [provider]  Glutamine POWD Take 1 Scoop by mouth daily.   Yes [provider]  Multiple Vitamin (MULTIVITAMIN WITH MINERALS) TABS tablet Take 1 tablet by mouth daily.   Yes [provider]  Multiple Vitamins-Minerals (ZINC PO) Take 1 tablet by mouth daily.   Yes [provider]  apixaban (ELIQUIS) 5 MG TABS tablet Take 1 tablet (5 mg total) by mouth 2 (two) times daily. Patient not taking: No sig reported 01/05/18   Alberteen Sam, MD  metoprolol succinate (TOPROL-XL) 25 MG 24 hr tablet Take 1 tablet (25 mg total) by mouth daily. Patient not taking: No sig reported 01/06/18   Danford, Earl Lites, MD    Allergies    Patient has no known allergies.  Review of Systems   Review of Systems  Gastrointestinal: Positive for abdominal pain, diarrhea, nausea and vomiting.  All other systems reviewed and are negative.   Physical Exam Updated Vital Signs BP (!) 151/88   Pulse 60   Temp 98.3 F (36.8 C) (Oral)   Resp 18   SpO2 99%   Physical Exam Vitals and nursing note reviewed.  Constitutional:      Appearance: Normal appearance.  HENT:     Head:  Normocephalic and atraumatic.     Right Ear: External ear normal.     Left Ear: External ear normal.     Nose: Nose normal.     Mouth/Throat:     Mouth: Mucous membranes are dry.  Eyes:     General: Scleral icterus present.     Extraocular Movements: Extraocular movements intact.     Pupils: Pupils are equal, round, and reactive to light.  Cardiovascular:     Rate and Rhythm: Regular rhythm. Bradycardia present.     Pulses: Normal pulses.     Heart sounds: Normal heart sounds.  Pulmonary:     Effort: Pulmonary effort is normal.     Breath sounds: Normal breath sounds.  Abdominal:     General: Abdomen is flat. Bowel sounds are normal.     Palpations: Abdomen is soft.     Tenderness: There is  generalized abdominal tenderness.  Musculoskeletal:        General: Normal range of motion.     Cervical back: Normal range of motion and neck supple.  Skin:    General: Skin is warm.     Capillary Refill: Capillary refill takes less than 2 seconds.  Neurological:     General: No focal deficit present.     Mental Status: He is alert and oriented to person, place, and time.  Psychiatric:        Mood and Affect: Mood normal.        Behavior: Behavior normal.        Thought Content: Thought content normal.        Judgment: Judgment normal.     ED Results / Procedures / Treatments   Labs (all labs ordered are listed, but only abnormal results are displayed) Labs Reviewed  COMPREHENSIVE METABOLIC PANEL - Abnormal; Notable for the following components:      Result Value   Potassium 6.6 (*)    CO2 14 (*)    Glucose, Bld 114 (*)    BUN 171 (*)    Creatinine, Ser 13.87 (*)    Calcium 7.9 (*)    AST 1,427 (*)    ALT 5,044 (*)    Alkaline Phosphatase 133 (*)    Total Bilirubin 9.7 (*)    GFR, Estimated 4 (*)    Anion gap 24 (*)    All other components within normal limits  CBC - Abnormal; Notable for the following components:   RDW 17.7 (*)    Platelets 134 (*)    nRBC 0.6 (*)    All other components within normal limits  COMPREHENSIVE METABOLIC PANEL - Abnormal; Notable for the following components:   Potassium 7.4 (*)    CO2 14 (*)    Glucose, Bld 111 (*)    BUN 163 (*)    Creatinine, Ser 13.71 (*)    Calcium 7.3 (*)    Total Protein 6.0 (*)    AST 1,188 (*)    ALT 4,146 (*)    Total Bilirubin 9.1 (*)    GFR, Estimated 4 (*)    Anion gap 21 (*)    All other components within normal limits  CK - Abnormal; Notable for the following components:   Total CK 571 (*)    All other components within normal limits  ACETAMINOPHEN LEVEL - Abnormal; Notable for the following components:   Acetaminophen (Tylenol), Serum <10 (*)    All other components within normal limits   MAGNESIUM - Abnormal; Notable for the following components:  Magnesium 3.0 (*)    All other components within normal limits  RESP PANEL BY RT-PCR (FLU A&B, COVID) ARPGX2  LIPASE, BLOOD  AMMONIA  PROTIME-INR  URINALYSIS, ROUTINE W REFLEX MICROSCOPIC  HEPATITIS PANEL, ACUTE  HIV ANTIBODY (ROUTINE TESTING W REFLEX)  BASIC METABOLIC PANEL  PHOSPHORUS  PHOSPHORUS  MAGNESIUM  COMPREHENSIVE METABOLIC PANEL  CBC  CALCIUM, IONIZED  PROTIME-INR  BILIRUBIN, DIRECT  CK  SODIUM, URINE, RANDOM  CREATININE, URINE, RANDOM    EKG EKG Interpretation  Date/Time:  Wednesday January 31 2021 16:33:03 EDT Ventricular Rate:  44 PR Interval:  193 QRS Duration: 107 QT Interval:  483 QTC Calculation: 414 R Axis:   87 Text Interpretation: Sinus bradycardia Abnrm T, consider ischemia, anterolateral lds ST elevation, consider inferior injury 12 Lead; Mason-Likar Since last tracing rate slower Otherwise no significant change Confirmed by Jacalyn Lefevre [id number]) on 01/31/2021 5:14:24 PM   Radiology CT ABDOMEN PELVIS WO CONTRAST  Result Date: 01/31/2021 CLINICAL DATA:  Acute abdominal pain for several days EXAM: CT ABDOMEN AND PELVIS WITHOUT CONTRAST TECHNIQUE: Multidetector CT imaging of the abdomen and pelvis was performed following the standard protocol without IV contrast. COMPARISON:  None. FINDINGS: Lower chest: Small bilateral pleural effusions and mild dependent atelectasis. Hepatobiliary: Liver is within normal limits. Gallbladder is well distended with dependent density likely related to sludge. Pericholecystic fluid is noted although ascites is seen. Pancreas: Unremarkable. No pancreatic ductal dilatation or surrounding inflammatory changes. Spleen: Normal in size without focal abnormality. Adrenals/Urinary Tract: Adrenal glands are within normal limits. Kidneys show no renal calculi or definitive obstructive changes. Bladder is partially distended. Stomach/Bowel: Colon shows no obstructive or  inflammatory changes. The appendix is not well appreciated although no inflammatory changes are seen. Vascular/Lymphatic: No significant vascular findings are present. No enlarged abdominal or pelvic lymph nodes. Reproductive: Prostate is unremarkable. Other: Mild diffuse ascites is noted throughout the abdomen. Some generalized edema within the mesenteric fat is noted as well likely related to third spacing. Musculoskeletal: No acute or significant osseous findings. IMPRESSION: Small effusions and dependent atelectatic changes without focal confluent infiltrate. Mild ascites with reactive changes in the abdomen and pelvis. Changes are likely related to the patient's known chronic congestive failure. Gallbladder is well distended with pericholecystic fluid likely related to the underlying ascites. The need for further evaluation can be determined on a clinical basis. Electronically Signed   By: Alcide Clever M.D.   On: 01/31/2021 20:46    Procedures Procedures   Medications Ordered in ED Medications  sodium bicarbonate injection 50 mEq (has no administration in time range)  sodium chloride 0.9 % bolus 1,000 mL (has no administration in time range)  sodium bicarbonate injection 50 mEq (has no administration in time range)  sodium bicarbonate 150 mEq in dextrose 5 % 1,150 mL infusion (has no administration in time range)  sodium zirconium cyclosilicate (LOKELMA) packet 10 g (has no administration in time range)  calcium gluconate 1 g/ 50 mL sodium chloride IVPB (has no administration in time range)  acetaminophen (TYLENOL) tablet 650 mg (has no administration in time range)    Or  acetaminophen (TYLENOL) suppository 650 mg (has no administration in time range)  albuterol (PROVENTIL) (2.5 MG/3ML) 0.083% nebulizer solution 10 mg (has no administration in time range)  HYDROmorphone (DILAUDID) injection 0.5 mg (has no administration in time range)  LORazepam (ATIVAN) injection 0.5 mg (has no  administration in time range)  sodium chloride 0.9 % bolus 1,000 mL (0 mLs Intravenous Stopped 01/31/21 1829)  morphine 4 MG/ML injection 4 mg (4 mg Intravenous Given 01/31/21 1659)  ondansetron (ZOFRAN) injection 4 mg (4 mg Intravenous Given 01/31/21 1659)  sodium chloride 0.9 % bolus 1,000 mL (1,000 mLs Intravenous New Bag/Given 01/31/21 1901)  insulin aspart (novoLOG) injection 5 Units (5 Units Intravenous Given 01/31/21 2059)    And  dextrose 50 % solution 50 mL (50 mLs Intravenous Given 01/31/21 2207)    ED Course  I have reviewed the triage vital signs and the nursing notes.  Pertinent labs & imaging results that were available during my care of the patient were reviewed by me and considered in my medical decision making (see chart for details).    MDM Rules/Calculators/A&P                          Pt's labs are quite abnormal.  He is in acute kidney and liver failure.  He denies using tylenol.  No hx hepatitis.  He has been using supplements for his body building.  He was d/w Dr. Gaynell FaceMarshall (CCM) who felt that he could go to the floor since his vitals are nl unless nephrology thought he needed emergent dialysis.   Pt given IVFs and insulin/glucose/bicarb for his hyper K.     Pt d/w Dr. Malen GauzeFoster (nephrology) who came to see pt in the ED.  She recommended foley and ordered a bicarb drip/continuous albuterol.  [facility]  CT abd/pelvis did not show any cause for liver/renal failure.  Pt will need to have a GI consult as well.  Pt d/w Dr. Arlean HoppingHowerter ([facility]) [facility]  CRITICAL CARE Performed by: Jacalyn LefevreJulie Brindle Leyba   Total critical care time: 30 minutes  Critical care time was exclusive of separately billable procedures and treating other patients.  Critical care was necessary to treat or prevent imminent or life-threatening deterioration.  Critical care was time spent personally by me on the following activities: development of treatment plan with  patient and/or surrogate as well as nursing, discussions with consultants, evaluation of patient's response to treatment, examination of patient, obtaining history from patient or surrogate, ordering and performing treatments and interventions, ordering and review of laboratory studies, ordering and review of radiographic studies, pulse oximetry and re-evaluation of patient's condition.  Final Clinical Impression(s) / ED Diagnoses Final diagnoses:  Dehydration  Acute renal failure, unspecified acute renal failure type (HCC)  Acute liver failure without hepatic coma  Bradycardia  Hyperkalemia    Rx / DC Orders ED Discharge Orders    None       Jacalyn LefevreHaviland, Tanikka Bresnan, MD 01/31/21 2212

## 2021-01-31 NOTE — ED Triage Notes (Signed)
Emergency Medicine Provider Triage Evaluation Note  Evan Vargas , a 39 y.o. male  was evaluated in triage.  Pt complains of nvd, avd pain that started 4 days ago.  Review of Systems  Positive: Nvd, abd pain Negative: fever  Physical Exam  BP (!) 164/101   Pulse (!) 41   Temp 98.3 F (36.8 C) (Oral)   Resp 18   SpO2 100%  Gen:   Awake, no distress   HEENT:  Atraumatic  Resp:  Normal effort  Cardiac:  Normal rate  Abd:   Nondistended, mild ruq ttp MSK:   Moves extremities without difficulty  Neuro:  Speech clear   Medical Decision Making  Medically screening exam initiated at 4:21 PM.  Appropriate orders placed.  Evan Vargas was informed that the remainder of the evaluation will be completed by another provider, this initial triage assessment does not replace that evaluation, and the importance of remaining in the ED until their evaluation is complete.  Clinical Impression   Indicated to nursing staff that pt should be prioritized for room due to his low hr  MSE was initiated and I personally evaluated the patient and placed orders (if any) at  4:23 PM on January 31, 2021.  The patient appears stable so that the remainder of the MSE may be completed by another provider.    Karrie Meres, PA-C 01/31/21 1624

## 2021-01-31 NOTE — ED Triage Notes (Signed)
Pt reports RUQ pain and N/V/D since Saturday night. Denies blood in vomit and rectal bleeding. Pt reports not being able to keep food and water down.

## 2021-01-31 NOTE — H&P (Signed)
History and Physical    PLEASE NOTE THAT DRAGON DICTATION SOFTWARE WAS USED IN THE CONSTRUCTION OF THIS NOTE.   Evan Vargas:[id number] DOB: 1981/04/11 DOA: 01/31/2021  PCP: Pcp, No Patient coming from: home   I have personally briefly reviewed patient's old medical records in [facility]  Chief Complaint: Nausea and vomiting  HPI: Evan Vargas is a 40 y.o. male with medical history significant for paroxysmal atrial fibrillation not on chronic anticoagulation, chronic diastolic heart failure, who is admitted to [facility] on 01/31/2021 with acute renal failure after presenting from home to [facility] ED complaining of nausea and vomiting.   The patient reports 2 days of intermittent nausea resulting in approximately 10 episodes of nonbloody, nonbilious emesis.  He also notes associated diarrhea over that time, resulting in approximately 10 episodes of loose stool, with most recent episode occurring a few hours ago.  Denies any associated melena or hematochezia.  He also notes 2 days of constant right upper quadrant abdominal pain.  Describes this as sharp and nonradiating in nature.  Patient denies any exacerbating or ameliorating factors, including no worsening with attempts to eat or drink, no worsening with palpation of the abdomen.  He also reports that the abdominal discomfort is nonpositional.  Has never experienced pain similar to to this.  Denies any recent trauma or travel.  Denies any associated subjective fever, chills, rigors, or generalized myalgias.  Denies any history of or recent alcohol consumption, denies any recreational drug use.  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  He also notes that he participated in a bodybuilding competition over the weekend, leading up to which she reports taking "extra" of supplements.  Denies any recent use of  acetaminophen, salicylates, or consumption of wild mushrooms.   He was previously hospitalized in March 2019, at which time he was diagnosed with paroxysmal atrial fibrillation as well as acute diastolic heart failure.  At that time, he underwent echocardiogram, which showed normal left ventricular cavity size, severe concentric hypertrophy consistent with hypertrophic cardiomyopathy, LVEF 65 to 70%, no focal wall motion abnormalities, and the study was deemed to be suboptimal for evaluation of diastolic parameters.  This echocardiogram also showed severely dilated bilateral atria, severe tricuspid regurgitation, and mild mitral regurgitation.  He is not on any diuretic medications at home.  At the time of his diagnosis of paroxysmal atrial fibrillation in March 2019, the patient was started on Eliquis and Toprol-XL, and was discharged on these medications.  However, the patient reports that he took the Eliquis and Toprol-XL for the ensuing 1 month following discharge from the hospital, but is not taking any additional of these medications since that time.  he reports that he is on no blood thinning agents as an outpatient coming occluding no formal anticoagulation, and is on no additional AV nodal blocking agents at home.  Per chart review, most recent prior serum creatinine data point was noted to be 1.31 in March 2019.  Not on any ACE inhibitor's or ARB's as an outpatient.  Denies any recent neck stiffness, rhinitis, rhinorrhea, sore throat, shortness of breath, wheezing, cough, or rash.  No recent known COVID-19 exposures.  Denies any recent dysuria, gross hematuria.  He also denies any recent chest pain, diaphoresis, palpitations, dizziness, presyncope, or syncope.     ED Course:  Vital signs in the ED were notable for the following:  Temperature max 98.3, heart rate 41-60; blood pressure 138/80 -151/88; respiratory rate 16-18; oxygen saturation 98 to 100% on room air.  Labs were notable for the  following: Initial CMP was conducted and ED today, with repeat CMP performed 3 hours later.  Pertinent results from these laboratory studies include the following: Initial CMP was notable for sodium 140, potassium 6.6, with repeat value trending up to 7.4, chloride 102, bicarbonate 14, anion gap 24, with interval decrease to 21, BUN 171, with repeat improving to 163, initial creatinine 13.87, which improved slightly to 13.71 following interval administration of IV fluids, as further detailed below; glucose 114, albumin 4.0, alkaline phosphatase 133, which decreased to 121, initial AST 1427, which improved to 1188, ALT 5000, which improved to 4100, total bilirubin 9.7, which improved to 9.7.  Acute viral hepatitis panel has been ordered, with result currently pending.  INR 1.1.  Lipase 30.  Acetaminophen level less than 30, and ammonia 32.  CBC notable for white blood cell count 6700.  Screening nasopharyngeal COVID-19 PCR was performed in the ED this evening, with result currently pending.  EKG, by way of comparison to most recent prior from 01/05/2018, showed sinus bradycardia with heart rate 44, normal intervals, T wave inversion in lateral leads, relative T wave flattening seen in most recent prior EKG, and T wave inversion in V2 through V4, with no evidence of ST changes, including no evidence of ST elevation.  CT abdomen/pelvis without contrast showed mild ascites, distended gallbladder with pericholecystic fluid likely related to underlying ascites, no evidence of common bile duct dilation, no evidence of cholelithiasis or choledocholithiasis.  The patient's case was discussed with the on-call nephrologist, Dr. Malen Gauze, who requested the patient be admitted to [facility] for further evaluation and management of presenting acute renal failure. Dr. Malen Gauze to formally consult and nephrology to follow at [facility]. Dr. Malen Gauze also requested initiation sodium bicarbonate drip.  Additionally, the patient's case was  discussed with the on-call pulmonary critical care physician, Dr. Gaynell Face, who felt that, given the patient's stable vital signs, could be managed in the stepdown unit under the care of the hospitalist service, with critical care service available if subsequently needed.   While in the ED, the following were administered: NovoLog 5 units IV x1, morphine 4 mg IV x1, Zofran 4 mg IV x1, sodium bicarb 50 mEq IV x1 followed by initiation of sodium bicarbonate drip at 75 cc/h, Lokelma 10 mg p.o. x1, calcium gluconate 1 g IV x1, and normal saline x3 L bolus.  Subsequently, the patient was admitted to [facility] for further evaluation and management of presenting acute renal failure.     Review of Systems: As per HPI otherwise 10 point review of systems negative.   History reviewed. No pertinent past medical history.  Past Surgical History:  Procedure Laterality Date  . ANTERIOR CRUCIATE LIGAMENT REPAIR     left  . ANTERIOR CRUCIATE LIGAMENT REPAIR  2004    Social History:  reports that he has never smoked. He has never used smokeless tobacco. He reports that he does not drink alcohol and does not use drugs.   No Known Allergies  Family History  Problem Relation Age of Onset  . Heart disease Maternal Grandmother      Prior to Admission medications   Medication Sig Start Date End Date Taking? Authorizing Provider  acetaminophen (TYLENOL) 500 MG tablet Take 1,000 mg by mouth every 6 (six) hours as needed for moderate pain.   Yes [provider]  Creatine POWD Take 1 Scoop by mouth daily.   Yes [provider]  Glutamine POWD Take 1 Scoop by mouth daily.   Yes [provider]  Multiple Vitamin (MULTIVITAMIN WITH MINERALS) TABS tablet Take 1 tablet by mouth daily.   Yes [provider]  Multiple Vitamins-Minerals (ZINC PO) Take 1 tablet by mouth daily.   Yes [provider]  apixaban (ELIQUIS) 5 MG TABS tablet Take 1 tablet (5 mg total) by mouth  2 (two) times daily. Patient not taking: No sig reported 01/05/18   Alberteen Sam, MD  metoprolol succinate (TOPROL-XL) 25 MG 24 hr tablet Take 1 tablet (25 mg total) by mouth daily. Patient not taking: No sig reported 01/06/18   Alberteen Sam, MD     Objective    Physical Exam: Vitals:   01/31/21 1930 01/31/21 2000 01/31/21 2030 01/31/21 2100  BP: (!) 148/91 (!) 140/91 (!) 148/91 (!) 151/88  Pulse: (!) 41 (!) 39 (!) 43 60  Resp: 18 16 17 18   Temp:      TempSrc:      SpO2: 100% 99% 99% 99%    General: appears to be stated age; alert, oriented Skin: warm, dry, no rash Head:  AT/[place] Mouth:  Oral mucosa membranes appear dry, normal dentition Neck: supple; trachea midline Heart:  Bradycardic, but regular; 2/6 systolic murmur noted.  Lungs: CTAB, did not appreciate any wheezes, rales, or rhonchi Abdomen: + BS; soft, ND; mild tenderness to palpation over the right upper quadrant in the absence of any associated guarding, rigidity, or rebound tenderness. Vascular: 2+ pedal pulses b/l; 2+ radial pulses b/l Extremities: no peripheral edema, no muscle wasting Neuro: strength and sensation intact in upper and lower extremities b/l    Labs on Admission: I have personally reviewed following labs and imaging studies  CBC: Recent Labs  Lab 01/31/21 1647  WBC 6.7  HGB 13.8  HCT 39.8  MCV 90.5  PLT 134*   Basic Metabolic Panel: Recent Labs  Lab 01/31/21 1647 01/31/21 1912 01/31/21 1928  NA 140  --  137  K 6.6*  --  7.4*  CL 102  --  102  CO2 14*  --  14*  GLUCOSE 114*  --  111*  BUN 171*  --  163*  CREATININE 13.87*  --  13.71*  CALCIUM 7.9*  --  7.3*  MG  --  3.0*  --    GFR: CrCl cannot be calculated (Unknown ideal weight.). Liver Function Tests: Recent Labs  Lab 01/31/21 1647 01/31/21 1928  AST 1,427* 1,188*  ALT 5,044* 4,146*  ALKPHOS 133* 121  BILITOT 9.7* 9.1*  PROT 6.5 6.0*  ALBUMIN 4.0 3.8   Recent Labs  Lab 01/31/21 1647  LIPASE  30   Recent Labs  Lab 01/31/21 1928  AMMONIA 32   Coagulation Profile: Recent Labs  Lab 01/31/21 1912  INR 1.1   Cardiac Enzymes: Recent Labs  Lab 01/31/21 1912  CKTOTAL 571*   BNP (last 3 results) No results for input(s): PROBNP in the last 8760 hours. HbA1C: No results for input(s): HGBA1C in the last 72 hours. CBG: No results for input(s): GLUCAP in the last 168 hours. Lipid Profile: No results for input(s): CHOL, HDL, LDLCALC, TRIG, CHOLHDL, LDLDIRECT in the last 72 hours. Thyroid Function Tests: No results for input(s): TSH, T4TOTAL, FREET4, T3FREE, THYROIDAB in the last 72 hours. Anemia Panel: No results for input(s): VITAMINB12, FOLATE, FERRITIN, TIBC, IRON, RETICCTPCT in the last  72 hours. Urine analysis:    Component Value Date/Time   COLORURINE COLORLESS (A) 01/02/2018 1527   APPEARANCEUR CLEAR 01/02/2018 1527   LABSPEC 1.011 01/02/2018 1527   PHURINE 7.0 01/02/2018 1527   GLUCOSEU NEGATIVE 01/02/2018 1527   HGBUR NEGATIVE 01/02/2018 1527   BILIRUBINUR NEGATIVE 01/02/2018 1527   KETONESUR NEGATIVE 01/02/2018 1527   PROTEINUR NEGATIVE 01/02/2018 1527   NITRITE NEGATIVE 01/02/2018 1527   LEUKOCYTESUR NEGATIVE 01/02/2018 1527    Radiological Exams on Admission: CT ABDOMEN PELVIS WO CONTRAST  Result Date: 01/31/2021 CLINICAL DATA:  Acute abdominal pain for several days EXAM: CT ABDOMEN AND PELVIS WITHOUT CONTRAST TECHNIQUE: Multidetector CT imaging of the abdomen and pelvis was performed following the standard protocol without IV contrast. COMPARISON:  None. FINDINGS: Lower chest: Small bilateral pleural effusions and mild dependent atelectasis. Hepatobiliary: Liver is within normal limits. Gallbladder is well distended with dependent density likely related to sludge. Pericholecystic fluid is noted although ascites is seen. Pancreas: Unremarkable. No pancreatic ductal dilatation or surrounding inflammatory changes. Spleen: Normal in size without focal  abnormality. Adrenals/Urinary Tract: Adrenal glands are within normal limits. Kidneys show no renal calculi or definitive obstructive changes. Bladder is partially distended. Stomach/Bowel: Colon shows no obstructive or inflammatory changes. The appendix is not well appreciated although no inflammatory changes are seen. Vascular/Lymphatic: No significant vascular findings are present. No enlarged abdominal or pelvic lymph nodes. Reproductive: Prostate is unremarkable. Other: Mild diffuse ascites is noted throughout the abdomen. Some generalized edema within the mesenteric fat is noted as well likely related to third spacing. Musculoskeletal: No acute or significant osseous findings. IMPRESSION: Small effusions and dependent atelectatic changes without focal confluent infiltrate. Mild ascites with reactive changes in the abdomen and pelvis. Changes are likely related to the patient's known chronic congestive failure. Gallbladder is well distended with pericholecystic fluid likely related to the underlying ascites. The need for further evaluation can be determined on a clinical basis. Electronically Signed   By: Alcide Clever M.D.   On: 01/31/2021 20:46     EKG: Independently reviewed, with result as described above.    Assessment/Plan   Arish Redner is a 40 y.o. male with medical history significant for paroxysmal atrial fibrillation not on chronic anticoagulation, chronic diastolic heart failure, who is admitted to [facility] on 01/31/2021 with acute renal failure after presenting from home to [facility] ED complaining of nausea and vomiting.    Principal Problem:   Acute renal failure (ARF) (HCC) Active Problems:   Hyperkalemia   High anion gap metabolic acidosis   Nausea & vomiting   Diarrhea   Transaminitis   Hyperbilirubinemia   Paroxysmal atrial fibrillation (HCC)    #) Acute renal failure: Relative to most recent prior serum creatinine level of 1.31 in March 2019, presenting  labs reflect serum creatinine of 13.87, which is shown small improvement down to 13.71 following interval IV fluids.  Unclear etiology at this time, although there appears to be a prerenal element given preceding nausea/vomiting as well as diarrhea over the last 2 days, however it is unclear if the patient's nausea/vomiting is as a consequence of the worsening renal function versus has a major precipitating factor leading to its development.  Urinalysis with microscopy has been ordered, with result currently pending.  Appears to be associated with presenting hyperkalemia as well as anion gap metabolic acidosis, as further detailed below.  The patient reports that he continues to produce urine.  Of note, CPK is mildly elevated, as further detailed  below.  The patient's case was discussed with the on-call nephrologist, Dr. Malen Gauze, who requested the patient be admitted to [facility] for further evaluation and management of presenting acute renal failure. She will formally consult and nephrology to follow at [facility].    Plan: IV fluids, as further detailed below.  Follow for result of urinalysis with microscopy, including evaluation for the presence of urinary casts.  Add on random urine sodium as well as random urine creatinine.  Monitor strict I's and O's and daily weights.  Attempt avoid nephrotoxic agents.  Formal nephrology consult, and admission to [facility], as detailed above.  Repeat BMP at midnight (02/01/21) for further monitoring of renal function as well as associated hyperkalemia.  Add on serum phosphorus level.  Repeat CMP in the morning, at which time also repeat serum magnesium and phosphorus levels.  Check ionized calcium level.  Monitor on telemetry.  Monitor continuous pulse oximetry.  Repeat CPK level in the morning.      #) Hyperkalemia: presenting serum potassium level of 6.6, with repeat trending up to 7.4.  This appears to be in the setting of presenting acute renal failure, as further  detailed above.  Unclear if there any pharmacologic contributions from over-the-counter supplements that the patient has been taking in the setting of his bodybuilding activities.  No overt contributory prescribed medications identified at this time.  No evidence of associated peaked T waves on presenting EKG.  Nephrology formally consulted, with plan to admit to [facility] for further evaluation, as further detailed above.  Dr. Malen Gauze of nephrology has also ordered initiation of sodium bicarbonate drip.  Of note, the patient was also received NovoLog 5 units IV x1, sodium bicarbonate 50 mEq IV x1 dose, Lokelma x1 dose, calcium gluconate 1 g IV x1, and IV fluid boluses, as detailed above.  Serum magnesium level noted to be 3.0.   Plan: monitor on telemetry.  Sodium bicarbonate drip per nephrology.  NS at 50 cc/h, although may consider converting to lactated Ringer's in the setting of ARF.  Repeat BMP at midnight, as above.  Add on serum phosphorus level.  Repeat BMP serum magnesium level, and phosphorus level in the morning.  Check ionized calcium level.  Monitor strict I's and O's and daily weights.  Additional evaluation management of presenting acute renal failure, including that of nephrology consultation, as detailed above.      #) Nausea/vomiting: Patient reports present episodes of nonbloody, nonbilious emesis over the last 2 days.  Unclear if this is a consequence of patient's developing acute renal failure, versus representing a primary factor leading to development of diminished renal function.  No evidence of DKA.  Differential also includes viral gastroenteritis.   Plan: Management of acute renal failure, as above, including gentle IV fluids.  As needed IV Ativan for nausea.  Follow for result of acute viral hepatitis panel.  Monitor strict I's and O's and daily weights.  Repeat BMP, serum magnesium level, as above.  Check urinary drug screen.     #) Diarrhea: 2 days of intermittent loose  stool in the absence of any melena or hematochezia, which appears to started after development of nausea/vomiting, raising the possibility of viral gastroenteritis as underlying etiology.  Does not meet SIRS criteria at this time to be considered septic.  No recent traveling or antibiotic use.  Plan: IV fluids, as above.  Stool studies ordered.  Repeat CBC in the morning.  Follow-up result of acute viral hepatitis panel.      #)  Acute transaminitis with hyperbilirubinemia: Presenting liver enzymes found to be elevated, as further quantified above, with interval slight trend down the repeat CMP.  Additionally total bilirubin noted to be 9.7, with interval trend down to 9.1 for repeat CMP.  Of note, none elevated alkaline phosphatase level suggests against a cholestatic process, further supported by today CT abdomen/pelvis result. CT abdomen/pelvis shows mild ascites as well as distended gallbladder with pericholecystic fluid likely related to the underlying ascites, in the absence of any evidence of common bile duct dilation, cholelithiasis, or choledocholithiasis.  Unclear etiology at this time.  Acute viral hepatitis panel currently pending.  May ultimately warrant additional imaging depending upon ensuing clinical course and trend of these laboratory studies.  Of note, serum acetaminophen level found to be less than 10.  Denies any use of recreational drugs.  Synthetic hepatic function appears to be intact, with presenting INR 1.1.   Plan: Repeat CMP in the morning.  Check direct bilirubin.  Follow-up result of acute viral hepatitis panel.  Urinary drug screen.  Recheck INR in the morning for close monitoring of interval synthetic hepatic function      #) Anion gap metabolic acidosis: Present bicarbonate level to be 14, with initial anion gap 24, subsequently demonstrating a trend down to 21.  Appears to be in setting of acute renal failure.  Initial INR found to be nonelevated at 1.1, although we  will continue to closely monitor trend and this value significant function.  No overt evidence of underlying infectious process at this time, if the patient does not meet SIRS criteria for sepsis at the present time.  No evidence of DKA.  Plan: Work-up and management of presenting acute renal failure, including initiation of sodium bicarbonate drip, per nephrology consult, as further detailed above.  Repeat BMP at midnight, and repeat CMP has been ordered for the morning.  Repeat INR in the morning as well as repeat CPK level.  Check salicylate level.      #) Elevated CPK: Initial total CPK found to be mildly elevated at 500.  Does not meet quantitative threshold for diagnosis of rhabdomyolysis at this time, and urinalysis to evaluate for evidence of myoglobinuria is currently pending at this time.  Plan: IV fluids, as above.  Follow-up result of urinalysis.  Monitor strict I's and O's and daily weights.  Repeat CPK level in the morning.  Recommend management of presenting acute renal failure, as above.        #) Paroxysmal atrial fibrillation: Documented history of such, with single episode occurring at time of hospitalization in March 2019.  Was discharged at that time on Eliquis as well as Toprol-XL, although the patient acknowledges that he has not taken either these medications with the exception of the very first month following discharge from the hospital in 2019.  Of note, CHA2DS2-VASc score of 1 does not meet criteria for chronic formal anticoagulation for thromboembolic prophylaxis.  Most recent echocardiogram performed in March 2019, with results as further detailed above.  Presenting EKG reflects normal sinus rhythm.    Plan: monitor strict I's & O's and daily weights. Repeat BMP and CBC in the morning.  Repeat serum magnesium level in the morning.  Anticipate further discussions with the patient regarding potential resumption of Eliquis as well as a beta-blocker, particularly given  his most recent echocardiogram findings, that included evidence to suggest hypertrophic cardiomyopathy, as further detailed above.  Monitor on telemetry.      #) Chronic diastolic heart failure: Most  recent echocardiogram in March 2019 was notable for severe concentric LV hypertrophy, consistent with hypertrophic cardiomyopathy, as further detailed above.  Patient was discharged on beta-blocker that time, but has not taken any of his Toprol -XL after initial 1 month of compliance following discharge from hospital.  Not on any AV nodal blocking agents at this time.  No evidence of acutely decompensated heart failure at this time, although we will closely monitor for ensuing development of evidence of volume overload given concomitant presenting acute renal failure, as above.  Plan: Monitor strict I's and O's and daily weights.  Anticipate additional discussions regarding resumption of BB given history of evidence of hypertrophic cardiomyopathy, as above.  Monitor on telemetry.  Repeat serum magnesium level in the morning.      DVT prophylaxis: scd's  Code Status: Full code Family Communication: none Disposition Plan: Per Rounding Team Consults called: case discussed with on-call nephrology, Dr.Foster, As further detailed above; case discussed with on-call pulm critical care physician, Dr.Marshall, as further detailed above. Admission status: Inpatient admission to [facility]; stepdown unit.      Of note, this patient was added by me to the following Admit List/Treatment Team: [email or web address].      PLEASE NOTE THAT DRAGON DICTATION SOFTWARE WAS USED IN THE CONSTRUCTION OF THIS NOTE.   Angie Fava DO [facility] Pager [phone] From 7PM - 7AM   01/31/2021, 9:34 PM

## 2021-01-31 NOTE — ED Notes (Signed)
Attempted second IV twice now by 2 different RNs, unsuccessful insertion.  Pt now refusing to be stuck again at this time.

## 2021-01-31 NOTE — Consult Note (Addendum)
[facility] Renal Consultation Note  Requesting MD: Jacalyn Lefevre, MD  Indication for Consultation:  Hyperkalemia, AKI   Chief complaint: nausea and vomiting   HPI:  Evan Vargas is a 40 y.o. male with history of paroxysmal afib and prior left ACL repair who presented with nausea/vomiting/diarrhea for 4 days.  Initially he thought he had food poisoning.  He competed this past Saturday and a bodybuilding event and later had a burger that was not cooked thoroughly.  He woke up with abdominal pain, chills, vomiting and had nonbloody diarrhea.  He is not aware of anyone else who has similar symptoms he has been on creatine supplements and does bodybuilding.  He states that in the few days leading up to the bodybuilding competition over the weekend that he reduced his water intake per his usual regimen.  He states that he took Tylenol a couple of days ago and took ibuprofen yesterday.  He denies taking either of these medications on a regular basis.  He has not been on metoprolol as previously rx'd and has not been on any type of steroid bodybuilding agent.  Here he was found to have AKI, hyperkalemia, bradycardia initially with 40's.  He was reluctant to have foley placed and asked to go to the restroom to try and void; he voided a minimal amount per RN after 1 liter of normal saline had infused.  Creatinine trends as below.  Note he was lost to follow-up after 2019 p afib diagnosis because he felt well.  Per ER MD he didn't have any temporizing agents between the first two sets of labs.  He denies any family history of renal failure or dialysis.  The patient states that he thinks his heart rate is normally low.  He denies consumption of excessive high K foods that we reviewed.  CT abdomen pelvis with bladder partially distended.  Kidneys no renal calculi or definitive obstructive changes; also noted with mild ascites, distended gallbladder.  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  We discussed the potential need for dialysis and the indications for that.  At this time he would like to exhaust medical measures and would like to provide consent for dialysis at a later time after these measures  Have been exhausted.  He indicates that he may be willing to proceed if he is able to be "knocked out" for the procedure; I let him know that being comfortable but awake would be the more likely scenario.  I discussed that most people in this scenario who did not respond to medical measures would want to pursue dialysis if needed in order to stay alive and he states that most people fear death.  Creatinine, Ser  Date/Time Value Ref Range Status  01/31/2021 07:28 PM 13.71 (H) 0.61 - 1.24 mg/dL Final  09/38/1829 93:71 PM 13.87 (H) 0.61 - 1.24 mg/dL Final  69/67/8938 10:17 AM 1.31 (H) 0.61 - 1.24 mg/dL Final  51/11/5850 77:82 AM 1.36 (H) 0.61 - 1.24 mg/dL Final  42/35/3614 43:15 PM 1.22 0.61 - 1.24 mg/dL Final  40/05/6760 95:09 PM 1.24 0.61 - 1.24 mg/dL Final     PMHx:   Past Medical History:  Diagnosis Date  . Paroxysmal atrial fibrillation (HCC)    dx'ed in 2019    Past Surgical History:  Procedure Laterality Date  . ANTERIOR CRUCIATE LIGAMENT REPAIR     left  . ANTERIOR CRUCIATE LIGAMENT REPAIR  2004    Family Hx:  Family History  Problem Relation Age of Onset  . Heart disease Maternal Grandmother     Social History:  reports that he has never smoked. He has never used smokeless tobacco. He reports that he does not drink alcohol and does not use drugs.  Allergies: No Known Allergies  Medications: Prior to Admission medications   Medication Sig Start Date End Date Taking? Authorizing Provider  acetaminophen (TYLENOL) 500 MG tablet Take 1,000 mg by mouth every 6 (six) hours as needed for moderate pain.   Yes [provider]  Creatine POWD Take 1 Scoop by mouth  daily.   Yes [provider]  Glutamine POWD Take 1 Scoop by mouth daily.   Yes [provider]  Multiple Vitamin (MULTIVITAMIN WITH MINERALS) TABS tablet Take 1 tablet by mouth daily.   Yes [provider]  Multiple Vitamins-Minerals (ZINC PO) Take 1 tablet by mouth daily.   Yes [provider]  apixaban (ELIQUIS) 5 MG TABS tablet Take 1 tablet (5 mg total) by mouth 2 (two) times daily. Patient not taking: No sig reported 01/05/18   Alberteen Sam, MD  metoprolol succinate (TOPROL-XL) 25 MG 24 hr tablet Take 1 tablet (25 mg total) by mouth daily. Patient not taking: No sig reported 01/06/18   Alberteen Sam, MD    I have reviewed the patient's current and prior to admission medications.  Labs:  BMP Latest Ref Rng & Units 01/31/2021 01/31/2021 01/05/2018  Glucose 70 - 99 mg/dL 086(P) 619(J) 82  BUN 6 - 20 mg/dL 093(O) 671(I) 45(Y)  Creatinine 0.61 - 1.24 mg/dL 09.98(P) 38.25(K) 5.39(J)  Sodium 135 - 145 mmol/L 137 140 141  Potassium 3.5 - 5.1 mmol/L 7.4(HH) 6.6(HH) 3.9  Chloride 98 - 111 mmol/L 102 102 107  CO2 22 - 32 mmol/L 14(L) 14(L) 25  Calcium 8.9 - 10.3 mg/dL 7.3(L) 7.9(L) 8.4(L)    Urinalysis    Component Value Date/Time   COLORURINE COLORLESS (A) 01/02/2018 1527   APPEARANCEUR CLEAR 01/02/2018 1527   LABSPEC 1.011 01/02/2018 1527   PHURINE 7.0 01/02/2018 1527   GLUCOSEU NEGATIVE 01/02/2018 1527   HGBUR NEGATIVE 01/02/2018 1527   BILIRUBINUR NEGATIVE 01/02/2018 1527   KETONESUR NEGATIVE 01/02/2018 1527   PROTEINUR NEGATIVE 01/02/2018 1527   NITRITE NEGATIVE 01/02/2018 1527   LEUKOCYTESUR NEGATIVE 01/02/2018 1527     ROS:  Pertinent items noted in HPI and remainder of comprehensive ROS otherwise negative.  Physical Exam: Vitals:   01/31/21 2030 01/31/21 2100  BP: (!) 148/91 (!) 151/88  Pulse: (!) 43 60  Resp: 17 18  Temp:    SpO2: 99% 99%     BP 120/68 and HR 45 on my exam General: Adult male in bed in no  acute distress at rest neck tired HEENT: Normocephalic atraumatic Eyes: Extraocular movements intact sclera slight icterus Neck: Supple trachea midline Heart: Bradycardic S1-S2 no rub appreciated Lungs: Clear to auscultation normal work of breathing at rest on room air Abdomen: Soft nontender nondistended reduced bowel sounds Extremities: No edema appreciated no cyanosis or clubbing Skin: No rash on extremities exposed Neuro: Alert and oriented x3 provides a history and follows commands Genitourinary no Foley in place Psych no anxiety or agitation  Assessment/Plan:  # Hyperkalemia - Setting of AKI  - ordered bicarb 1 amp (2 amps total now) and transition to bicarb gtt - lokelma once now - calcium gluconate now - stat BMP - spoke with ER - covid  test is ordered and was done in the room during my exam - ordered albuterol neb  - We discussed the potential need for dialysis should medical measures fail.  At this time he would like to exhaust medical measures and would like to provide consent for dialysis at a later time after these measures have been exhausted.  # AKI  - Recent pre-renal insults of n/v/d - nonbloody diarrhea.  UA with 100 mg/dL protein, 5-85 RBC.  CK only mildly elevated.  Platelets mildly reduced at 134 so TTP/HUS felt unlikely.  Hepatitis panel nonreactive. - Transition to bicarb gtt - Please insert foley  - up/cr ratio  - ANCA, ANA, complement - note HIV is ordered - may need HD - see above  # Bradycardia  - Concurrent hyperkalemia - Measures as above - he is not on beta blocker  # Metabolic acidosis  - Setting of AKI - Starting bicarb gtt   # Nausea/vomiting/diarrhea - Supportive measures per primary team and GI is consulted  # Acute liver injury  - Note GI consulted  - slight improvement in transaminitis  # Gallbladder distention - GI consulted   # Hypertrophic cardiomyopathy  - noted on 2019 TTE - gentle hydration  Discussed with ED and  recommend transfer to [facility] for admission given the potential need for dialysis.  I discussed this with the patient as well  Estanislado Emms 01/31/2021 , 11:00 PM

## 2021-01-31 NOTE — ED Notes (Signed)
Pt refusing foley at this time, however states he will accept one later possibly.  [facility] team aware.  Pt educated by RN as well as MD on importance of foley catheter and the reason for the foley catheter, pt verbalized understanding of information given.

## 2021-01-31 NOTE — ED Notes (Signed)
Pt reluctant to have foley catheter inserted, have attempted to administer medications several times, however pt insists RN leave room so he can attempt to void using urinal while standing.   

## 2021-01-31 NOTE — ED Notes (Signed)
[facility] dispatch has been called in for transport.

## 2021-01-31 NOTE — ED Notes (Signed)
Pt reluctant to have foley catheter inserted, have attempted to administer medications several times, however pt insists RN leave room so he can attempt to void using urinal while standing.

## 2021-02-01 ENCOUNTER — Encounter (HOSPITAL_COMMUNITY): Payer: Self-pay | Admitting: Internal Medicine

## 2021-02-01 ENCOUNTER — Inpatient Hospital Stay (HOSPITAL_COMMUNITY): Payer: Self-pay

## 2021-02-01 DIAGNOSIS — N178 Other acute kidney failure: Secondary | ICD-10-CM

## 2021-02-01 HISTORY — PX: IR PERC TUN PERIT CATH WO PORT S&I /IMAG: IMG2327

## 2021-02-01 LAB — RENAL FUNCTION PANEL
Albumin: 2.8 g/dL — ABNORMAL LOW (ref 3.5–5.0)
Anion gap: 12 (ref 5–15)
BUN: 145 mg/dL — ABNORMAL HIGH (ref 6–20)
CO2: 25 mmol/L (ref 22–32)
Calcium: 7 mg/dL — ABNORMAL LOW (ref 8.9–10.3)
Chloride: 99 mmol/L (ref 98–111)
Creatinine, Ser: 13.97 mg/dL — ABNORMAL HIGH (ref 0.61–1.24)
GFR, Estimated: 4 mL/min — ABNORMAL LOW (ref >60)
Glucose, Bld: 134 mg/dL — ABNORMAL HIGH (ref 70–99)
Phosphorus: 8.3 mg/dL — ABNORMAL HIGH (ref 2.5–4.6)
Potassium: 4.9 mmol/L (ref 3.5–5.1)
Sodium: 136 mmol/L (ref 135–145)

## 2021-02-01 LAB — CBC
HCT: 33.4 % — ABNORMAL LOW (ref 39.0–52.0)
Hemoglobin: 11.5 g/dL — ABNORMAL LOW (ref 13.0–17.0)
MCH: 31.2 pg (ref 26.0–34.0)
MCHC: 34.4 g/dL (ref 30.0–36.0)
MCV: 90.5 fL (ref 80.0–100.0)
Platelets: 89 10*3/uL — ABNORMAL LOW (ref 150–400)
RBC: 3.69 MIL/uL — ABNORMAL LOW (ref 4.22–5.81)
RDW: 17.9 % — ABNORMAL HIGH (ref 11.5–15.5)
WBC: 5.2 10*3/uL (ref 4.0–10.5)
nRBC: 0.6 % — ABNORMAL HIGH (ref 0.0–0.2)

## 2021-02-01 LAB — COMPREHENSIVE METABOLIC PANEL
ALT: 3224 U/L — ABNORMAL HIGH (ref 0–44)
AST: 847 U/L — ABNORMAL HIGH (ref 15–41)
Albumin: 3.1 g/dL — ABNORMAL LOW (ref 3.5–5.0)
Alkaline Phosphatase: 100 U/L (ref 38–126)
Anion gap: 19 — ABNORMAL HIGH (ref 5–15)
BUN: 166 mg/dL — ABNORMAL HIGH (ref 6–20)
CO2: 16 mmol/L — ABNORMAL LOW (ref 22–32)
Calcium: 7.2 mg/dL — ABNORMAL LOW (ref 8.9–10.3)
Chloride: 103 mmol/L (ref 98–111)
Creatinine, Ser: 14.06 mg/dL — ABNORMAL HIGH (ref 0.61–1.24)
GFR, Estimated: 4 mL/min — ABNORMAL LOW (ref >60)
Glucose, Bld: 112 mg/dL — ABNORMAL HIGH (ref 70–99)
Potassium: 6.3 mmol/L (ref 3.5–5.1)
Sodium: 138 mmol/L (ref 135–145)
Total Bilirubin: 8.6 mg/dL — ABNORMAL HIGH (ref 0.3–1.2)
Total Protein: 5.2 g/dL — ABNORMAL LOW (ref 6.5–8.1)

## 2021-02-01 LAB — BASIC METABOLIC PANEL
Anion gap: 18 — ABNORMAL HIGH (ref 5–15)
Anion gap: 20 — ABNORMAL HIGH (ref 5–15)
Anion gap: 22 — ABNORMAL HIGH (ref 5–15)
BUN: 170 mg/dL — ABNORMAL HIGH (ref 6–20)
BUN: 170 mg/dL — ABNORMAL HIGH (ref 6–20)
BUN: 173 mg/dL — ABNORMAL HIGH (ref 6–20)
CO2: 16 mmol/L — ABNORMAL LOW (ref 22–32)
CO2: 18 mmol/L — ABNORMAL LOW (ref 22–32)
CO2: 16 mmol/L — ABNORMAL LOW (ref 22–32)
Calcium: 7.1 mg/dL — ABNORMAL LOW (ref 8.9–10.3)
Calcium: 7.2 mg/dL — ABNORMAL LOW (ref 8.9–10.3)
Calcium: 7.2 mg/dL — ABNORMAL LOW (ref 8.9–10.3)
Chloride: 102 mmol/L (ref 98–111)
Chloride: 102 mmol/L (ref 98–111)
Chloride: 100 mmol/L (ref 98–111)
Creatinine, Ser: 13.86 mg/dL — ABNORMAL HIGH (ref 0.61–1.24)
Creatinine, Ser: 14.18 mg/dL — ABNORMAL HIGH (ref 0.61–1.24)
Creatinine, Ser: 14.67 mg/dL — ABNORMAL HIGH (ref 0.61–1.24)
GFR, Estimated: 4 mL/min — ABNORMAL LOW (ref >60)
GFR, Estimated: 4 mL/min — ABNORMAL LOW (ref >60)
GFR, Estimated: 4 mL/min — ABNORMAL LOW (ref >60)
Glucose, Bld: 113 mg/dL — ABNORMAL HIGH (ref 70–99)
Glucose, Bld: 114 mg/dL — ABNORMAL HIGH (ref 70–99)
Glucose, Bld: 125 mg/dL — ABNORMAL HIGH (ref 70–99)
Potassium: 6.2 mmol/L — ABNORMAL HIGH (ref 3.5–5.1)
Potassium: 6.3 mmol/L (ref 3.5–5.1)
Potassium: 6 mmol/L — ABNORMAL HIGH (ref 3.5–5.1)
Sodium: 138 mmol/L (ref 135–145)
Sodium: 138 mmol/L (ref 135–145)
Sodium: 138 mmol/L (ref 135–145)

## 2021-02-01 LAB — PROTIME-INR
INR: 1.1 (ref 0.8–1.2)
Prothrombin Time: 14.6 seconds (ref 11.4–15.2)

## 2021-02-01 LAB — PHOSPHORUS
Phosphorus: 9.8 mg/dL — ABNORMAL HIGH (ref 2.5–4.6)
Phosphorus: 9.9 mg/dL — ABNORMAL HIGH (ref 2.5–4.6)

## 2021-02-01 LAB — HIV ANTIBODY (ROUTINE TESTING W REFLEX): HIV Screen 4th Generation wRfx: NONREACTIVE

## 2021-02-01 LAB — MAGNESIUM: Magnesium: 2.6 mg/dL — ABNORMAL HIGH (ref 1.7–2.4)

## 2021-02-01 LAB — BILIRUBIN, DIRECT: Bilirubin, Direct: 6.3 mg/dL — ABNORMAL HIGH (ref 0.0–0.2)

## 2021-02-01 LAB — CK: Total CK: 450 U/L — ABNORMAL HIGH (ref 49–397)

## 2021-02-01 MED ORDER — MIDAZOLAM HCL 2 MG/2ML IJ SOLN
INTRAMUSCULAR | Status: AC
  Filled 2021-02-01: qty 2

## 2021-02-01 MED ORDER — LIDOCAINE-PRILOCAINE 2.5-2.5 % EX CREA
1.0000 "application " | TOPICAL_CREAM | CUTANEOUS | Status: DC | PRN
  Filled 2021-02-01: qty 5

## 2021-02-01 MED ORDER — CHLORHEXIDINE GLUCONATE CLOTH 2 % EX PADS
6.0000 | MEDICATED_PAD | Freq: Every day | CUTANEOUS | Status: DC
  Administered 2021-02-01 – 2021-02-06 (×3): 6 via TOPICAL

## 2021-02-01 MED ORDER — SODIUM CHLORIDE 0.9 % IV SOLN: 100.0000 mL | INTRAVENOUS | Status: DC | PRN

## 2021-02-01 MED ORDER — LIDOCAINE HCL (PF) 1 % IJ SOLN: 5.0000 mL | INTRAMUSCULAR | Status: DC | PRN

## 2021-02-01 MED ORDER — HEPARIN SODIUM (PORCINE) 1000 UNIT/ML IJ SOLN
INTRAMUSCULAR | Status: AC
  Filled 2021-02-01: qty 1

## 2021-02-01 MED ORDER — MIDAZOLAM HCL 2 MG/2ML IJ SOLN
INTRAMUSCULAR | Status: AC | PRN
  Administered 2021-02-01: 1 mg via INTRAVENOUS

## 2021-02-01 MED ORDER — SODIUM CHLORIDE 0.9 % IV BOLUS
250.0000 mL | Freq: Once | INTRAVENOUS | Status: AC
  Administered 2021-02-01: 250 mL via INTRAVENOUS

## 2021-02-01 MED ORDER — CALCIUM GLUCONATE-NACL 1-0.675 GM/50ML-% IV SOLN
1.0000 g | Freq: Once | INTRAVENOUS | Status: AC
  Administered 2021-02-01: 1000 mg via INTRAVENOUS
  Filled 2021-02-01: qty 50

## 2021-02-01 MED ORDER — ALBUTEROL SULFATE (2.5 MG/3ML) 0.083% IN NEBU
10.0000 mg | INHALATION_SOLUTION | Freq: Once | RESPIRATORY_TRACT | Status: AC
  Administered 2021-02-01: 10 mg via RESPIRATORY_TRACT
  Filled 2021-02-01: qty 12

## 2021-02-01 MED ORDER — ALTEPLASE 2 MG IJ SOLR
2.0000 mg | Freq: Once | INTRAMUSCULAR | Status: DC | PRN
  Filled 2021-02-01: qty 2

## 2021-02-01 MED ORDER — SODIUM ZIRCONIUM CYCLOSILICATE 10 G PO PACK
10.0000 g | PACK | Freq: Once | ORAL | Status: AC
  Administered 2021-02-01: 10 g via ORAL
  Filled 2021-02-01: qty 1

## 2021-02-01 MED ORDER — SODIUM BICARBONATE 8.4 % IV SOLN
50.0000 meq | Freq: Once | INTRAVENOUS | Status: AC
  Administered 2021-02-01: 50 meq via INTRAVENOUS
  Filled 2021-02-01: qty 50

## 2021-02-01 MED ORDER — PENTAFLUOROPROP-TETRAFLUOROETH EX AERO: 1.0000 "application " | INHALATION_SPRAY | CUTANEOUS | Status: DC | PRN

## 2021-02-01 MED ORDER — CEFAZOLIN SODIUM-DEXTROSE 2-4 GM/100ML-% IV SOLN
INTRAVENOUS | Status: AC
  Administered 2021-02-01: 2 g
  Filled 2021-02-01: qty 100

## 2021-02-01 MED ORDER — HEPARIN SODIUM (PORCINE) 1000 UNIT/ML DIALYSIS
1000.0000 [IU] | INTRAMUSCULAR | Status: DC | PRN
  Filled 2021-02-01 (×2): qty 1

## 2021-02-01 MED ORDER — FENTANYL CITRATE (PF) 100 MCG/2ML IJ SOLN
INTRAMUSCULAR | Status: AC | PRN
  Administered 2021-02-01 (×2): 12.5 ug via INTRAVENOUS
  Administered 2021-02-01: 25 ug via INTRAVENOUS

## 2021-02-01 MED ORDER — LIDOCAINE-EPINEPHRINE 1 %-1:100000 IJ SOLN
INTRAMUSCULAR | Status: AC
  Filled 2021-02-01: qty 1

## 2021-02-01 MED ORDER — FENTANYL CITRATE (PF) 100 MCG/2ML IJ SOLN
INTRAMUSCULAR | Status: AC
  Filled 2021-02-01: qty 2

## 2021-02-01 NOTE — Procedures (Signed)
Interventional Radiology Procedure:   Indications: Acute kidney injury  Procedure: Tunneled dialysis catheter placement  Findings: Right jugular Palindrome, tip at SVC/RA junction.  Complications: None     EBL: less than 10 ml  Plan: Catheter is ready to use.    Rebeka Kimble R. Lowella Dandy, MD  Pager: [phone]

## 2021-02-01 NOTE — Consult Note (Signed)
Patient Status: [facility] - In-pt  Assessment and Plan:  40 y/o M who presented to [facility] ED yesterday with complaints of abdominal pain with n/v/d that started 3 days earlier during a competitive bodybuilding show, he was found to have AKI with significant hyperkalemia and was transferred to [facility] for initiation of HD. IR has been asked to place a tunneled HD catheter for durable venous access.  Risks and benefits discussed with the patient, his mother and sister including, but not limited to bleeding, infection, vascular injury, pneumothorax which may require chest tube placement, air embolism or even death.  All of the patient's questions were answered, patient is agreeable to proceed. He asks that his mother sign his consent form due to weakness.  Consent signed and in chart. ______________________________________________________________________   History of Present Illness: Evan Vargas is a 40 y.o. male seen today for tunneled HD catheter placement in order to initiate . Patient presented to [facility] ED on 4/20 with complaints of abdominal pain with n/v/d x 3 days. He reported being in a bodybuilding show over the weekend when the symptoms began. He frequently takes creatine and glutamine supplements for bodybuilding. He reported no PMH to the ED provider but chart review shows admission in 2019 for a.flutter, CHF and possible hypertrophic cardiomyopathy - he was prescribed Eliquis and metoprolol but does not take them. Initial labs notable for AKI and hyperkalemia, he was transferred to [facility] to begin hemodialysis. IR has been asked to place a tunneled HD catheter for durable venous access.  Patient seen in his room, sister and mother at bedside, his aunt is on the phone as well. Family with many questions regarding HD and catheter placement which were answered to the best of my abilities, message sent to nephrology provider so they may follow up with family regarding their questions. Patient reports  feeling very weak and having severe abdominal pain, he has not urinated at all today. His voice is barely audible when speaking. He is asking to drink something as his mouth is very dry. He states he does not want "want" to start HD but understands the need and is agreeable to it as long as it is not going to be forever. He and his family are agreeable to HD catheter placement today in IR.  Allergies and medications reviewed.   Review of Systems: A 12 point ROS discussed and pertinent positives are indicated in the HPI above.  All other systems are negative.  Review of Systems  Constitutional: Positive for appetite change and fatigue.  Respiratory: Negative for cough and shortness of breath.   Cardiovascular: Negative for chest pain.  Gastrointestinal: Positive for abdominal pain and nausea. Negative for vomiting.  Genitourinary: Positive for decreased urine volume.  Neurological: Negative for dizziness and headaches.    Vital Signs: BP 133/69 (BP Location: Right Arm)   Pulse (!) 45   Temp 98 F (36.7 C) (Oral)   Resp 18   Ht 5\' 8"  (1.727 m)   Wt 184 lb 4.9 oz (83.6 kg)   SpO2 98%   BMI 28.02 kg/m   Physical Exam Vitals and nursing note reviewed.  Constitutional:      General: He is not in acute distress.    Appearance: He is ill-appearing.  HENT:     Head: Normocephalic.     Mouth/Throat:     Mouth: Mucous membranes are dry.     Pharynx: Oropharynx is clear. No oropharyngeal exudate or posterior oropharyngeal erythema.  Cardiovascular:  Rate and Rhythm: Regular rhythm. Bradycardia present.  Pulmonary:     Effort: Pulmonary effort is normal.     Breath sounds: Normal breath sounds.  Abdominal:     General: There is no distension.     Palpations: Abdomen is soft.     Tenderness: There is no abdominal tenderness.  Skin:    General: Skin is warm and dry.  Neurological:     Mental Status: He is alert and oriented to person, place, and time.  Psychiatric:        Mood  and Affect: Mood normal.        Behavior: Behavior normal.        Thought Content: Thought content normal.        Judgment: Judgment normal.      Imaging reviewed.   Labs:  COAGS: Recent Labs    01/31/21 1912 02/01/21 0210  INR 1.1 1.1    BMP: Recent Labs    01/31/21 1647 01/31/21 1928 02/01/21 0210 02/01/21 0623  NA 140 137 138  138  138 138  K 6.6* 7.4* 6.3*  6.3*  6.2* 6.0*  CL 102 102 103  102  102 100  CO2 14* 14* 16*  16*  18* 16*  GLUCOSE 114* 111* 112*  113*  114* 125*  BUN 171* 163* 166*  170*  170* 173*  CALCIUM 7.9* 7.3* 7.2*  7.2*  7.1* 7.2*  CREATININE 13.87* 13.71* 14.06*  13.86*  14.18* 14.67*  GFRNONAA 4* 4* 4*  4*  4* 4*       Electronically Signed: Villa Herb, PA-C 02/01/2021, 10:56 AM   I spent a total of 15 minutes in face to face in clinical consultation, greater than 50% of which was counseling/coordinating care for venous access.

## 2021-02-01 NOTE — Progress Notes (Signed)
CRITICAL VALUE ALERT  Critical value received K+=6.3  Date of notification:  02/01/21  Time of notification 0318  Critical value read back:Yes.    Nurse who received alert:  Lissa Hoard RN  MD notified (1st page):  Dr.Foster  Time of first page:  0320  MD notified (2nd page):  Time of second page:  Responding MD:  Dr.Foster  Time MD responded:  [phone]

## 2021-02-01 NOTE — Progress Notes (Signed)
[facility] ROUNDING NOTE   Subjective:   Interval History: Is an otherwise healthy 40 year old gentleman with acute uremic symptoms over the past 4 days including nausea vomiting and diarrhea.  He also complains of some abdominal pain and discomfort.  He actively engages in bodybuilding and has been using creatine supplements.  There is no use nonsteroidal anti-inflammatory drugs he is admitted to decreasing urine output.  His blood pressure is not elevated.  He is not anemic.  His labs showed severe metabolic disarray with a glucose of 111 BUN 163 creatinine 13.7 calcium 7.4 CO2 14 calcium 7.3.  Of note his previous creatinine was 1.3 mg/dL on 9/76/7341.  Discussions with the patient and Dr. Malen Gauze, nephrology have been noted.  The plan is to undergo placement of IJ catheter and the session of dialysis 02/01/2021 noted to achieve correction of his metabolic abnormalities and improvements of his uremic symptoms.  There was some microscopic hematuria and serologies have been sent.  Of course these are pending at this time.  There is no evidence of any drug use.  And CK levels were not elevated to any extent.  Blood pressure 133/69 pulse 42 temperature 98 O2 sats 97% room air  IV sodium bicarbonate 125 cc an hour   Home medications: Eliquis 5 mg twice daily, metoprolol 25 mg daily, creatinine powder 1 scoop daily, glutamine powder 1 scoop daily, multivitamins 1 daily  Sodium 138 potassium 6 chloride 100 CO2 16 BUN 173 creatinine 14.67 calcium 7.2 glucose 125.  Hemoglobin 11.5  No recorded urine output     Objective:  Vital signs in last 24 hours:  Temp:  [97.6 F (36.4 C)-99 F (37.2 C)] 98 F (36.7 C) (04/21 0816) Pulse Rate:  [36-70] 45 (04/21 0816) Resp:  [9-22] 18 (04/21 0816) BP: (120-167)/(65-101) 133/69 (04/21 0816) SpO2:  [98 %-100 %] 98 % (04/21 0816) Weight:  [83.6 kg] 83.6 kg (04/21 0033)  Weight change:  Filed Weights   02/01/21 0033  Weight: 83.6 kg     Intake/Output: I/O last 3 completed shifts: In: 1547.6 [P.O.:45; I.V.:208; IV Piggyback:1294.6] Out: -    Intake/Output this shift:  No intake/output data recorded.  Alert pleasant gentleman nondistressed CVS- RRR no murmurs rubs gallops RS- CTA no wheezes or rales ABD- BS present soft non-distended EXT- no edema   Basic Metabolic Panel: Recent Labs  Lab 01/31/21 1647 01/31/21 1912 01/31/21 1928 02/01/21 0210 02/01/21 0623  NA 140  --  137 138  138  138 138  K 6.6*  --  7.4* 6.3*  6.3*  6.2* 6.0*  CL 102  --  102 103  102  102 100  CO2 14*  --  14* 16*  16*  18* 16*  GLUCOSE 114*  --  111* 112*  113*  114* 125*  BUN 171*  --  163* 166*  170*  170* 173*  CREATININE 13.87*  --  13.71* 14.06*  13.86*  14.18* 14.67*  CALCIUM 7.9*  --  7.3* 7.2*  7.2*  7.1* 7.2*  MG  --  3.0*  --  2.6*  --   PHOS  --   --   --  9.9*  9.8*  --     Liver Function Tests: Recent Labs  Lab 01/31/21 1647 01/31/21 1928 02/01/21 0210  AST 1,427* 1,188* 847*  ALT 5,044* 4,146* 3,224*  ALKPHOS 133* 121 100  BILITOT 9.7* 9.1* 8.6*  PROT 6.5 6.0* 5.2*  ALBUMIN 4.0 3.8 3.1*   Recent  Labs  Lab 01/31/21 1647  LIPASE 30   Recent Labs  Lab 01/31/21 1928  AMMONIA 32    CBC: Recent Labs  Lab 01/31/21 1647 02/01/21 0210  WBC 6.7 5.2  HGB 13.8 11.5*  HCT 39.8 33.4*  MCV 90.5 90.5  PLT 134* 89*    Cardiac Enzymes: Recent Labs  Lab 01/31/21 1912 02/01/21 0210  CKTOTAL 571* 450*    BNP: Invalid input(s): POCBNP  CBG: No results for input(s): GLUCAP in the last 168 hours.  Microbiology: Results for orders placed or performed during the hospital encounter of 01/31/21  Resp Panel by RT-PCR (Flu A&B, Covid) Nasopharyngeal Swab     Status: None   Collection Time: 01/31/21 10:40 PM   Specimen: Nasopharyngeal Swab; Nasopharyngeal(NP) swabs in vial transport medium  Result Value Ref Range Status   SARS Coronavirus 2 by RT PCR NEGATIVE NEGATIVE Final     Comment: (NOTE) SARS-CoV-2 target nucleic acids are NOT DETECTED.  The SARS-CoV-2 RNA is generally detectable in upper respiratory specimens during the acute phase of infection. The lowest concentration of SARS-CoV-2 viral copies this assay can detect is 138 copies/mL. A negative result does not preclude SARS-Cov-2 infection and should not be used as the sole basis for treatment or other patient management decisions. A negative result may occur with  improper specimen collection/handling, submission of specimen other than nasopharyngeal swab, presence of viral mutation(s) within the areas targeted by this assay, and inadequate number of viral copies(<138 copies/mL). A negative result must be combined with clinical observations, patient history, and epidemiological information. The expected result is Negative.  Fact Sheet for Patients:  BloggerCourse.com  Fact Sheet for Healthcare Providers:  SeriousBroker.it  This test is no t yet approved or cleared by the [place] FDA and  has been authorized for detection and/or diagnosis of SARS-CoV-2 by FDA under an Emergency Use Authorization (EUA). This EUA will remain  in effect (meaning this test can be used) for the duration of the COVID-19 declaration under Section 564(b)(1) of the Act, 21 U.S.C.section 360bbb-3(b)(1), unless the authorization is terminated  or revoked sooner.       Influenza A by PCR NEGATIVE NEGATIVE Final   Influenza B by PCR NEGATIVE NEGATIVE Final    Comment: (NOTE) The Xpert Xpress SARS-CoV-2/FLU/RSV plus assay is intended as an aid in the diagnosis of influenza from Nasopharyngeal swab specimens and should not be used as a sole basis for treatment. Nasal washings and aspirates are unacceptable for Xpert Xpress SARS-CoV-2/FLU/RSV testing.  Fact Sheet for Patients: BloggerCourse.com  Fact Sheet for Healthcare  Providers: SeriousBroker.it  This test is not yet approved or cleared by the [place] FDA and has been authorized for detection and/or diagnosis of SARS-CoV-2 by FDA under an Emergency Use Authorization (EUA). This EUA will remain in effect (meaning this test can be used) for the duration of the COVID-19 declaration under Section 564(b)(1) of the Act, 21 U.S.C. section 360bbb-3(b)(1), unless the authorization is terminated or revoked.  Performed at [facility], [address], [place], [place] [address]     Coagulation Studies: Recent Labs    01/31/21 1912 02/01/21 0210  LABPROT 14.2 14.6  INR 1.1 1.1    Urinalysis: Recent Labs    01/31/21 2130  COLORURINE YELLOW  LABSPEC 1.011  PHURINE 5.0  GLUCOSEU 50*  HGBUR SMALL*  BILIRUBINUR NEGATIVE  KETONESUR NEGATIVE  PROTEINUR 100*  NITRITE NEGATIVE  LEUKOCYTESUR MODERATE*      Imaging: CT ABDOMEN PELVIS WO  CONTRAST  Result Date: 01/31/2021 CLINICAL DATA:  Acute abdominal pain for several days EXAM: CT ABDOMEN AND PELVIS WITHOUT CONTRAST TECHNIQUE: Multidetector CT imaging of the abdomen and pelvis was performed following the standard protocol without IV contrast. COMPARISON:  None. FINDINGS: Lower chest: Small bilateral pleural effusions and mild dependent atelectasis. Hepatobiliary: Liver is within normal limits. Gallbladder is well distended with dependent density likely related to sludge. Pericholecystic fluid is noted although ascites is seen. Pancreas: Unremarkable. No pancreatic ductal dilatation or surrounding inflammatory changes. Spleen: Normal in size without focal abnormality. Adrenals/Urinary Tract: Adrenal glands are within normal limits. Kidneys show no renal calculi or definitive obstructive changes. Bladder is partially distended. Stomach/Bowel: Colon shows no obstructive or inflammatory changes. The appendix is not well appreciated although no inflammatory  changes are seen. Vascular/Lymphatic: No significant vascular findings are present. No enlarged abdominal or pelvic lymph nodes. Reproductive: Prostate is unremarkable. Other: Mild diffuse ascites is noted throughout the abdomen. Some generalized edema within the mesenteric fat is noted as well likely related to third spacing. Musculoskeletal: No acute or significant osseous findings. IMPRESSION: Small effusions and dependent atelectatic changes without focal confluent infiltrate. Mild ascites with reactive changes in the abdomen and pelvis. Changes are likely related to the patient's known chronic congestive failure. Gallbladder is well distended with pericholecystic fluid likely related to the underlying ascites. The need for further evaluation can be determined on a clinical basis. Electronically Signed   By: Alcide Clever M.D.   On: 01/31/2021 20:46     Medications:   . sodium chloride    . sodium chloride    . sodium bicarbonate 150 mEq in D5W infusion 125 mL/hr at 02/01/21 0429   . Chlorhexidine Gluconate Cloth  6 each Topical Q0600   sodium chloride, sodium chloride, acetaminophen **OR** acetaminophen, alteplase, heparin, HYDROmorphone (DILAUDID) injection, lidocaine (PF), lidocaine-prilocaine, LORazepam, pentafluoroprop-tetrafluoroeth  Assessment/ Plan:  1. Hyperkalemia -seems to be slightly improved.  Continue Lokelma and treatment of metabolic acidosis with IV bicarbonate.  This should improve intracellular shifts.  Patient is going to be dialyzed after placement of dialysis catheter.  2.  AKI  Recent pre-renal insults of n/v/d - nonbloody diarrhea.  UA with 100 mg/dL protein, 8-25 RBC.  CK only mildly elevated.  Platelets mildly reduced at 134 so TTP/HUS felt unlikely.  Hepatitis panel nonreactive.  No evidence of any obstruction.  Mild ascites noted.  Baseline creatinine appears to be in normal range.  Serologies have been sent.  Agree with dialysis.  We will continue to follow renal  function.  Patient is to be kept even during dialysis treatments and I would continue to hydrate with IV bicarbonate 3 Bradycardia  Concurrent hyperkalemia.  This may or may not be contributory.  Patient is athletic and may have a resting bradycardia 4. Metabolic acidosis  IV sodium bicarbonate commend at 125 cc an hour 5.Nausea/vomiting/diarrhea Supportive measures per primary team and GI is consulted.  I believe this probably secondary to uremia 6. Gallbladder distention.  Appears to have some transaminitis we will continue to follow.  Hepatitis studies appear to be negative at this point. - GI consulted  7.Hypertrophic cardiomyopathy -continue IV hydration    LOS: 1 Evan Vargas @TODAY @9 :37 AM

## 2021-02-01 NOTE — Plan of Care (Signed)
Spoke with patient again via phone.  Hyperkalemia despite additional medical measures overnight, persistent bradycardia, and though hyperkalemia has improved slightly his markedly elevated BUN and Cr are unchanged.  Discussed risks/benefits/indications of dialysis and of not performing dialysis including death.  He does consent to dialysis and to getting IV access for dialysis.  All questions answered.  RN Lissa Hoard present in room with patient as witness.   Placing order for tunneled catheter with IR and for HD 2 hour treatment today.  Would plan for additional HD on 4/22 as well  Estanislado Emms, MD 02/01/2021 6:44 AM

## 2021-02-01 NOTE — Progress Notes (Signed)
Lab.results relayed to Dr.Foster & new orders received.

## 2021-02-01 NOTE — Progress Notes (Signed)
   02/01/21 0449  Assess: MEWS Score  Temp 99 F (37.2 C)  BP (!) 147/84  Pulse Rate (!) 42  ECG Heart Rate (!) 39  Resp 20  SpO2 98 %  O2 Device Room Air  Assess: MEWS Score  MEWS Temp 0  MEWS Systolic 0  MEWS Pulse 2  MEWS RR 0  MEWS LOC 0  MEWS Score 2  MEWS Score Color Yellow  Assess: if the MEWS score is Yellow or Red  Were vital signs taken at a resting state? Yes  Focused Assessment No change from prior assessment  Early Detection of Sepsis Score *See Row Information* Low  MEWS guidelines implemented *See Row Information* Yes  Treat  Pain Scale 0-10  Pain Score 0  Take Vital Signs  Increase Vital Sign Frequency  Yellow: Q 2hr X 2 then Q 4hr X 2, if remains yellow, continue Q 4hrs  Escalate  MEWS: Escalate Yellow: discuss with charge nurse/RN and consider discussing with provider and RRT  Notify: Charge Nurse/RN  Name of Charge Nurse/RN Notified Heather RN  Date Charge Nurse/RN Notified 02/01/21  Time Charge Nurse/RN Notified 0556  Document  Patient Outcome Not stable and remains on department

## 2021-02-01 NOTE — Progress Notes (Signed)
PROGRESS NOTE   Evan Vargas  NWG:[id number] DOB: Jun 21, 1981 DOA: 01/31/2021 PCP: Pcp, No   Brief Narrative:  Evan Vargas is a 40 y.o. male with medical history significant for paroxysmal atrial fibrillation not on chronic anticoagulation, chronic diastolic heart failure, who is admitted to [facility] on 01/31/2021 with acute renal failure after presenting from home to [facility] ED complaining of profound nausea vomiting and diarrhea without any signs or symptoms of bleeding.  Patient is a known bodybuilder, recently attempting to dehydrate in preparation for an event, cutting fluids while taking protein supplements.  Nephrology consulted recommending transfer to [facility] for further evaluation and likely dialysis given profoundly elevated creatinine and potassium.   Assessment & Plan:   Principal Problem:   Acute renal failure (ARF) (HCC) Active Problems:   Hyperkalemia   High anion gap metabolic acidosis   Nausea & vomiting   Diarrhea   Transaminitis   Hyperbilirubinemia   Paroxysmal atrial fibrillation (HCC)   Acute profound kidney injury, questionably provoked in the setting of dietary changes and poor p.o. intake -Nephrology consulted, appreciate insight and recommendations -Creatinine continues to rise despite IV fluids and temporizing measures per nephrology -plan to initiate dialysis tonight 02/01/2021 after temporary dialysis catheter placement -Patient admits to "clean"diet as he was recently competing in a bodybuilding competition which requires him to eat specific foods as well as to limit free water intake and attempts to dehydrate.  Unclear if patient's protein and meal supplements are contributing to patient's current renal failure. -Continue bicarb drip  Profound hyperkalemia, symptomatic, POA -Improving with temporizing measures, dialysis to definitively treat per nephrology -Intractable nausea vomiting and diarrhea likely secondary to electrolyte  abnormalities and elevated uremia -Diarrhea and vomiting likely partially assisting in preventing profoundly worse hyperkalemia -Previously given insulin, Lokelma, calcium gluconate and fluid bolus at previous facility; bicarb drip ongoing Lab Results  Component Value Date   K 6.0 (H) 02/01/2021    Concurrent intractable nausea vomiting diarrhea likely in the setting of above -Resolved with temporizing measures of hyperkalemia and supportive care -Continue IV fluids with bicarb as above  Acutely elevated LFTs - Unclear if related to above profound electrolyte abnormalities, poor perfusion or secondary to patient's dietary and supplement  - Continue to follow morning labs  - Labs downtrending appropriately, continue to follow -hold off on GI consult -CT abdomen shows mild ascites with gallbladder distention and pericholecystic fluid CMP Latest Ref Rng & Units 02/01/2021 02/01/2021 02/01/2021  Glucose 70 - 99 mg/dL 578(I125(H) 696(E112(H) 952(W113(H)  BUN 6 - 20 mg/dL 413(K173(H) 440(N166(H) 027(O170(H)  Creatinine 0.61 - 1.24 mg/dL 53.66(Y14.67(H) 40.34(V14.06(H) 42.59(D13.86(H)  Sodium 135 - 145 mmol/L 138 138 138  Potassium 3.5 - 5.1 mmol/L 6.0(H) 6.3(HH) 6.3(HH)  Chloride 98 - 111 mmol/L 100 103 102  CO2 22 - 32 mmol/L 16(L) 16(L) 16(L)  Calcium 8.9 - 10.3 mg/dL 7.2(L) 7.2(L) 7.2(L)  Total Protein 6.5 - 8.1 g/dL - 5.2(L) -  Total Bilirubin 0.3 - 1.2 mg/dL - 8.6(H) -  Alkaline Phos 38 - 126 U/L - 100 -  AST 15 - 41 U/L - 847(H) -  ALT 0 - 44 U/L - 3,224(H) -   Anion gap metabolic acidosis in the setting of above and uremia -Continue IV fluids with bicarb, dialysis planned for later today likely to normalize over the next 24 hours pending dialysis success  Elevated CPK/rhabdomyolysis -Continue IV fluids, likely in the setting of profound dehydration, renal failure and lifestyle(bodybuilder)  Paroxysmal atrial fibrillation, known history, without  RVR: -CHA2DS2-VASc of 1, no indication for anticoagulation -Continue to follow  clinically, on telemetry given above  Chronic diastolic heart failure -Continue to follow clinically, patient currently appears hypovolemic if anything, currently on IV fluids, dialysis to help manage volume status until patient has hopeful renal recovery   DVT prophylaxis: scd's  Code Status: Full code Family Communication:  Mother and sister at bedside Status is: Inpatient  Dispo: The patient is from: Home              Anticipated d/c is to: To be determined              Anticipated d/c date is: >72h              Patient currently not medically stable for discharge  Consultants:   Nephrology  Procedures:   Dialysis catheter placement and dialysis initiation 02/01/2021  Antimicrobials:  None indicated  Subjective: No acute issues or events overnight, patient feels markedly improved, nausea vomiting and diarrhea have essentially resolved at this point denies chest pain shortness of breath headache fevers or chills.  Objective: Vitals:   02/01/21 0347 02/01/21 0435 02/01/21 0449 02/01/21 0650  BP: 131/70  (!) 147/84 136/69  Pulse:  (!) 38 (!) 42 (!) 45  Resp: 15 15 20 18   Temp: 98.1 F (36.7 C)  99 F (37.2 C) 98.8 F (37.1 C)  TempSrc: Oral  Oral Oral  SpO2:  98% 98% 98%  Weight:      Height:        Intake/Output Summary (Last 24 hours) at 02/01/2021 0812 Last data filed at 02/01/2021 0600 Gross per 24 hour  Intake 1547.59 ml  Output --  Net 1547.59 ml   Filed Weights   02/01/21 0033  Weight: 83.6 kg    Examination:  General exam: Appears calm and comfortable  Respiratory system: Clear to auscultation. Respiratory effort normal. Cardiovascular system: S1 & S2 heard, RRR. No JVD, murmurs, rubs, gallops or clicks. No pedal edema. Gastrointestinal system: Abdomen is nondistended, soft and nontender. No organomegaly or masses felt. Normal bowel sounds heard. Central nervous system: Alert and oriented. No focal neurological deficits. Extremities:  Symmetric 5 x 5 power. Skin: No rashes, lesions or ulcers Psychiatry: Judgement and insight appear normal. Mood & affect appropriate.     Data Reviewed: I have personally reviewed following labs and imaging studies  CBC: Recent Labs  Lab 01/31/21 1647 02/01/21 0210  WBC 6.7 5.2  HGB 13.8 11.5*  HCT 39.8 33.4*  MCV 90.5 90.5  PLT 134* 89*   Basic Metabolic Panel: Recent Labs  Lab 01/31/21 1647 01/31/21 1912 01/31/21 1928 02/01/21 0210 02/01/21 0623  NA 140  --  137 138  138  138 138  K 6.6*  --  7.4* 6.3*  6.3*  6.2* 6.0*  CL 102  --  102 103  102  102 100  CO2 14*  --  14* 16*  16*  18* 16*  GLUCOSE 114*  --  111* 112*  113*  114* 125*  BUN 171*  --  163* 166*  170*  170* 173*  CREATININE 13.87*  --  13.71* 14.06*  13.86*  14.18* 14.67*  CALCIUM 7.9*  --  7.3* 7.2*  7.2*  7.1* 7.2*  MG  --  3.0*  --  2.6*  --   PHOS  --   --   --  9.9*  9.8*  --    GFR: Estimated Creatinine Clearance: 7.2 mL/min (A) (by  C-G formula based on SCr of 14.67 mg/dL (H)). Liver Function Tests: Recent Labs  Lab 01/31/21 1647 01/31/21 1928 02/01/21 0210  AST 1,427* 1,188* 847*  ALT 5,044* 4,146* 3,224*  ALKPHOS 133* 121 100  BILITOT 9.7* 9.1* 8.6*  PROT 6.5 6.0* 5.2*  ALBUMIN 4.0 3.8 3.1*   Recent Labs  Lab 01/31/21 1647  LIPASE 30   Recent Labs  Lab 01/31/21 1928  AMMONIA 32   Coagulation Profile: Recent Labs  Lab 01/31/21 1912 02/01/21 0210  INR 1.1 1.1   Cardiac Enzymes: Recent Labs  Lab 01/31/21 1912 02/01/21 0210  CKTOTAL 571* 450*   BNP (last 3 results) No results for input(s): PROBNP in the last 8760 hours. HbA1C: No results for input(s): HGBA1C in the last 72 hours. CBG: No results for input(s): GLUCAP in the last 168 hours. Lipid Profile: No results for input(s): CHOL, HDL, LDLCALC, TRIG, CHOLHDL, LDLDIRECT in the last 72 hours. Thyroid Function Tests: No results for input(s): TSH, T4TOTAL, FREET4, T3FREE, THYROIDAB in the last 72  hours. Anemia Panel: No results for input(s): VITAMINB12, FOLATE, FERRITIN, TIBC, IRON, RETICCTPCT in the last 72 hours. Sepsis Labs: No results for input(s): PROCALCITON, LATICACIDVEN in the last 168 hours.  Recent Results (from the past 240 hour(s))  Resp Panel by RT-PCR (Flu A&B, Covid) Nasopharyngeal Swab     Status: None   Collection Time: 01/31/21 10:40 PM   Specimen: Nasopharyngeal Swab; Nasopharyngeal(NP) swabs in vial transport medium  Result Value Ref Range Status   SARS Coronavirus 2 by RT PCR NEGATIVE NEGATIVE Final    Comment: (NOTE) SARS-CoV-2 target nucleic acids are NOT DETECTED.  The SARS-CoV-2 RNA is generally detectable in upper respiratory specimens during the acute phase of infection. The lowest concentration of SARS-CoV-2 viral copies this assay can detect is 138 copies/mL. A negative result does not preclude SARS-Cov-2 infection and should not be used as the sole basis for treatment or other patient management decisions. A negative result may occur with  improper specimen collection/handling, submission of specimen other than nasopharyngeal swab, presence of viral mutation(s) within the areas targeted by this assay, and inadequate number of viral copies(<138 copies/mL). A negative result must be combined with clinical observations, patient history, and epidemiological information. The expected result is Negative.  Fact Sheet for Patients:  BloggerCourse.com  Fact Sheet for Healthcare Providers:  SeriousBroker.it  This test is no t yet approved or cleared by the [place] FDA and  has been authorized for detection and/or diagnosis of SARS-CoV-2 by FDA under an Emergency Use Authorization (EUA). This EUA will remain  in effect (meaning this test can be used) for the duration of the COVID-19 declaration under Section 564(b)(1) of the Act, 21 U.S.C.section 360bbb-3(b)(1), unless the authorization is  terminated  or revoked sooner.       Influenza A by PCR NEGATIVE NEGATIVE Final   Influenza B by PCR NEGATIVE NEGATIVE Final    Comment: (NOTE) The Xpert Xpress SARS-CoV-2/FLU/RSV plus assay is intended as an aid in the diagnosis of influenza from Nasopharyngeal swab specimens and should not be used as a sole basis for treatment. Nasal washings and aspirates are unacceptable for Xpert Xpress SARS-CoV-2/FLU/RSV testing.  Fact Sheet for Patients: BloggerCourse.com  Fact Sheet for Healthcare Providers: SeriousBroker.it  This test is not yet approved or cleared by the [place] FDA and has been authorized for detection and/or diagnosis of SARS-CoV-2 by FDA under an Emergency Use Authorization (EUA). This EUA will remain in effect (meaning this test  can be used) for the duration of the COVID-19 declaration under Section 564(b)(1) of the Act, 21 U.S.C. section 360bbb-3(b)(1), unless the authorization is terminated or revoked.  Performed at [facility], [address], [place], [place] [address]      Radiology Studies: CT ABDOMEN PELVIS WO CONTRAST  Result Date: 01/31/2021 CLINICAL DATA:  Acute abdominal pain for several days EXAM: CT ABDOMEN AND PELVIS WITHOUT CONTRAST TECHNIQUE: Multidetector CT imaging of the abdomen and pelvis was performed following the standard protocol without IV contrast. COMPARISON:  None. FINDINGS: Lower chest: Small bilateral pleural effusions and mild dependent atelectasis. Hepatobiliary: Liver is within normal limits. Gallbladder is well distended with dependent density likely related to sludge. Pericholecystic fluid is noted although ascites is seen. Pancreas: Unremarkable. No pancreatic ductal dilatation or surrounding inflammatory changes. Spleen: Normal in size without focal abnormality. Adrenals/Urinary Tract: Adrenal glands are within normal limits. Kidneys show no renal calculi  or definitive obstructive changes. Bladder is partially distended. Stomach/Bowel: Colon shows no obstructive or inflammatory changes. The appendix is not well appreciated although no inflammatory changes are seen. Vascular/Lymphatic: No significant vascular findings are present. No enlarged abdominal or pelvic lymph nodes. Reproductive: Prostate is unremarkable. Other: Mild diffuse ascites is noted throughout the abdomen. Some generalized edema within the mesenteric fat is noted as well likely related to third spacing. Musculoskeletal: No acute or significant osseous findings. IMPRESSION: Small effusions and dependent atelectatic changes without focal confluent infiltrate. Mild ascites with reactive changes in the abdomen and pelvis. Changes are likely related to the patient's known chronic congestive failure. Gallbladder is well distended with pericholecystic fluid likely related to the underlying ascites. The need for further evaluation can be determined on a clinical basis. Electronically Signed   By: Alcide Clever M.D.   On: 01/31/2021 20:46   Scheduled Meds: . Chlorhexidine Gluconate Cloth  6 each Topical Q0600   Continuous Infusions: . sodium bicarbonate 150 mEq in D5W infusion 125 mL/hr at 02/01/21 0429     LOS: 1 day   Time spent:  Azucena Fallen, DO [facility]  If 7PM-7AM, please contact night-coverage [email or web address]  02/01/2021, 8:12 AM

## 2021-02-01 NOTE — Plan of Care (Signed)
  Problem: Education: Goal: Knowledge of General Education information will improve Description: Including pain rating scale, medication(s)/side effects and non-pharmacologic comfort measures Outcome: Progressing   Problem: Clinical Measurements: Goal: Ability to maintain clinical measurements within normal limits will improve Outcome: Progressing Goal: Diagnostic test results will improve Outcome: Progressing   

## 2021-02-02 LAB — COMPREHENSIVE METABOLIC PANEL
ALT: 1963 U/L — ABNORMAL HIGH (ref 0–44)
AST: 448 U/L — ABNORMAL HIGH (ref 15–41)
Albumin: 2.7 g/dL — ABNORMAL LOW (ref 3.5–5.0)
Alkaline Phosphatase: 84 U/L (ref 38–126)
Anion gap: 19 — ABNORMAL HIGH (ref 5–15)
BUN: 148 mg/dL — ABNORMAL HIGH (ref 6–20)
CO2: 21 mmol/L — ABNORMAL LOW (ref 22–32)
Calcium: 7 mg/dL — ABNORMAL LOW (ref 8.9–10.3)
Chloride: 96 mmol/L — ABNORMAL LOW (ref 98–111)
Creatinine, Ser: 14.68 mg/dL — ABNORMAL HIGH (ref 0.61–1.24)
GFR, Estimated: 4 mL/min — ABNORMAL LOW (ref >60)
Glucose, Bld: 121 mg/dL — ABNORMAL HIGH (ref 70–99)
Potassium: 5 mmol/L (ref 3.5–5.1)
Sodium: 136 mmol/L (ref 135–145)
Total Bilirubin: 4.4 mg/dL — ABNORMAL HIGH (ref 0.3–1.2)
Total Protein: 4.7 g/dL — ABNORMAL LOW (ref 6.5–8.1)

## 2021-02-02 LAB — CBC
HCT: 28.4 % — ABNORMAL LOW (ref 39.0–52.0)
Hemoglobin: 10.5 g/dL — ABNORMAL LOW (ref 13.0–17.0)
MCH: 31.6 pg (ref 26.0–34.0)
MCHC: 37 g/dL — ABNORMAL HIGH (ref 30.0–36.0)
MCV: 85.5 fL (ref 80.0–100.0)
Platelets: 75 10*3/uL — ABNORMAL LOW (ref 150–400)
RBC: 3.32 MIL/uL — ABNORMAL LOW (ref 4.22–5.81)
RDW: 17.2 % — ABNORMAL HIGH (ref 11.5–15.5)
WBC: 4.8 10*3/uL (ref 4.0–10.5)
nRBC: 0 % (ref 0.0–0.2)

## 2021-02-02 LAB — CK: Total CK: 291 U/L (ref 49–397)

## 2021-02-02 LAB — CALCIUM, IONIZED: Calcium, Ionized, Serum: 3.4 mg/dL — ABNORMAL LOW (ref 4.5–5.6)

## 2021-02-02 LAB — ANA: Anti Nuclear Antibody (ANA): NEGATIVE

## 2021-02-02 MED ORDER — CHLORHEXIDINE GLUCONATE CLOTH 2 % EX PADS
6.0000 | MEDICATED_PAD | Freq: Every day | CUTANEOUS | Status: DC
  Administered 2021-02-02 – 2021-02-05 (×2): 6 via TOPICAL

## 2021-02-02 MED ORDER — HYDROMORPHONE HCL 1 MG/ML IJ SOLN
INTRAMUSCULAR | Status: AC
  Filled 2021-02-02: qty 0.5

## 2021-02-02 MED ORDER — HEPARIN SODIUM (PORCINE) 1000 UNIT/ML IJ SOLN
INTRAMUSCULAR | Status: AC
  Administered 2021-02-02: 1000 [IU] via INTRAVENOUS_CENTRAL
  Filled 2021-02-02: qty 4

## 2021-02-02 NOTE — Progress Notes (Addendum)
PROGRESS NOTE   Evan Vargas  ZOX:[id number] DOB: 1981-09-01 DOA: 01/31/2021 PCP: Pcp, No   Brief Narrative:  Evan Vargas is a 40 y.o. male with medical history significant for paroxysmal atrial fibrillation not on chronic anticoagulation, chronic diastolic heart failure, who is admitted to [facility] on 01/31/2021 with acute renal failure after presenting [facility] home [facility] ED complaining of profound nausea vomiting and diarrhea without any signs or symptoms of bleeding.  Patient is a known bodybuilder, recently attempting to dehydrate in preparation for an event, cutting fluids while taking protein supplements.  Nephrology consulted recommending [facility] for [facility] evaluation and likely dialysis given profoundly elevated creatinine and potassium.   Assessment & Plan:   Principal Problem:   Acute renal failure (ARF) (HCC) Active Problems:   Hyperkalemia   High anion gap metabolic acidosis   Nausea & vomiting   Diarrhea   Transaminitis   Hyperbilirubinemia   Paroxysmal atrial fibrillation (HCC)  Acute profound kidney injury, questionably provoked in the setting of diet and poor p.o. intake - Nephrology consulted, appreciate insight and recommendations - Temporary dialysis catheter placement 02/01/2021 - Repeat dialysis per nephrology today given ongoing hyperkalemia volume status elevated creatinine and BUN - High-protein diets have been shown to be somewhat detrimental to chronic kidney disease patients, again unclear what kind of supplements the patient is on which could contribute to kidney injury as well -Bicarb drip off overnight  Profound hyperkalemia, symptomatic, POA, resolving -Improving with dialysis, holding any further temporizing measures at this time Lab Results  Component Value Date   K 5.0 02/02/2021   Concurrent intractable nausea vomiting diarrhea likely in the setting of above -Resolved with temporizing measures of hyperkalemia and  supportive care -Continue IV fluids with bicarb as above  Acutely elevated LFTs, resolving Thrombocytopenia, ongoing - Likely secondary to above - Labs downtrending appropriately, continue to follow - hold off on GI consult - CT abdomen shows mild ascites with gallbladder distention and pericholecystic fluid CMP Latest Ref Rng & Units 02/02/2021 02/01/2021 02/01/2021  Glucose 70 - 99 mg/dL 811(B121(H) 147(W134(H) 295(A125(H)  BUN 6 - 20 mg/dL 213(Y148(H) 865(H145(H) 846(N173(H)  Creatinine 0.61 - 1.24 mg/dL 62.95(M14.68(H) 84.13(K13.97(H) 44.01(U14.67(H)  Sodium 135 - 145 mmol/L 136 136 138  Potassium 3.5 - 5.1 mmol/L 5.0 4.9 6.0(H)  Chloride 98 - 111 mmol/L 96(L) 99 100  CO2 22 - 32 mmol/L 21(L) 25 16(L)  Calcium 8.9 - 10.3 mg/dL 7.0(L) 7.0(L) 7.2(L)  Total Protein 6.5 - 8.1 g/dL 4.7(L) - -  Total Bilirubin 0.3 - 1.2 mg/dL 4.4(H) - -  Alkaline Phos 38 - 126 U/L 84 - -  AST 15 - 41 U/L 448(H) - -  ALT 0 - 44 U/L 1,963(H) - -   Anion gap metabolic acidosis in the setting of above and uremia -In the setting of uremia -likely to resolve with dialysis  Elevated CPK/rhabdomyolysis -Continue IV fluids, likely in the setting of exercise, profound dehydration, renal failure  Paroxysmal atrial fibrillation, known history, without RVR Asymptomatic bradycardia: -CHA2DS2-VASc of 1, no indication for anticoagulation -Continue to follow clinically, on telemetry given above -Likely bradycardic in the setting of increased cardiac reserves given lifestyle and workout regimen (typically seen in high endurance athletes)  Chronic diastolic heart failure -Continue to follow clinically, mild ascites on incoming imaging, appears euvolemic on exam today -Volume control with dialysis as above  DVT prophylaxis:  SCDs only in the setting of acute liver failure, thrombocytopenia Code Status: Full code Family Communication:  Mother and sister at bedside Status is: Inpatient  Dispo: The patient is from: Home              Anticipated d/c is to: To be  determined              Anticipated d/c date is: >72h              Patient currently not medically stable for discharge  Consultants:   Nephrology  Procedures:   Dialysis catheter placement and dialysis initiation 02/01/2021  Antimicrobials:  None indicated  Subjective: No acute issues or events overnight, patient's only complaint today is generalized abdominal pain, unclear if musculoskeletal versus GI in nature.  Continue Dilaudid, electrolyte management with dialysis  Objective: Vitals:   02/02/21 1414 02/02/21 1430 02/02/21 1500 02/02/21 1530  BP: 133/84 135/85 (!) 156/105 (!) 143/98  Pulse:      Resp: 19 17 20 16   Temp:      TempSrc:      SpO2: 96% 99%    Weight:      Height:        Intake/Output Summary (Last 24 hours) at 02/02/2021 1605 Last data filed at 02/01/2021 1815 Gross per 24 hour  Intake --  Output 0 ml  Net 0 ml   Filed Weights   02/01/21 1815 02/02/21 0446 02/02/21 1410  Weight: 88 kg 85.1 kg 87.6 kg    Examination:  General exam: Appears calm and comfortable  Respiratory system: Clear to auscultation. Respiratory effort normal. Cardiovascular system: S1 & S2 heard, RRR. No JVD, murmurs, rubs, gallops or clicks. No pedal edema. Gastrointestinal system: Abdomen is nondistended, soft and nontender. No organomegaly or masses felt. Normal bowel sounds heard. Central nervous system: Alert and oriented. No focal neurological deficits. Extremities: Symmetric 5 x 5 power. Skin: No rashes, lesions or ulcers Psychiatry: Judgement and insight appear normal. Mood & affect appropriate.     Data Reviewed: I have personally reviewed following labs and imaging studies  CBC: Recent Labs  Lab 01/31/21 1647 02/01/21 0210 02/02/21 0234  WBC 6.7 5.2 4.8  HGB 13.8 11.5* 10.5*  HCT 39.8 33.4* 28.4*  MCV 90.5 90.5 85.5  PLT 134* 89* 75*   Basic Metabolic Panel: Recent Labs  Lab 01/31/21 1912 01/31/21 1928 02/01/21 0210 02/01/21 0623 02/01/21 2254  02/02/21 0234  NA  --  137 138  138  138 138 136 136  K  --  7.4* 6.3*  6.3*  6.2* 6.0* 4.9 5.0  CL  --  102 103  102  102 100 99 96*  CO2  --  14* 16*  16*  18* 16* 25 21*  GLUCOSE  --  111* 112*  113*  114* 125* 134* 121*  BUN  --  163* 166*  170*  170* 173* 145* 148*  CREATININE  --  13.71* 14.06*  13.86*  14.18* 14.67* 13.97* 14.68*  CALCIUM  --  7.3* 7.2*  7.2*  7.1* 7.2* 7.0* 7.0*  MG 3.0*  --  2.6*  --   --   --   PHOS  --   --  9.9*  9.8*  --  8.3*  --    GFR: Estimated Creatinine Clearance: 7.3 mL/min (A) (by C-G formula based on SCr of 14.68 mg/dL (H)). Liver Function Tests: Recent Labs  Lab 01/31/21 1647 01/31/21 1928 02/01/21 0210 02/01/21 2254 02/02/21 0234  AST 1,427* 1,188* 847*  --  448*  ALT 5,044* 4,146* 3,224*  --  02/04/21*  ALKPHOS 133* 121 100  --  84  BILITOT 9.7* 9.1* 8.6*  --  4.4*  PROT 6.5 6.0* 5.2*  --  4.7*  ALBUMIN 4.0 3.8 3.1* 2.8* 2.7*   Recent Labs  Lab 01/31/21 1647  LIPASE 30   Recent Labs  Lab 01/31/21 1928  AMMONIA 32   Coagulation Profile: Recent Labs  Lab 01/31/21 1912 02/01/21 0210  INR 1.1 1.1   Cardiac Enzymes: Recent Labs  Lab 01/31/21 1912 02/01/21 0210 02/02/21 0234  CKTOTAL 571* 450* 291   BNP (last 3 results) No results for input(s): PROBNP in the last 8760 hours. HbA1C: No results for input(s): HGBA1C in the last 72 hours. CBG: No results for input(s): GLUCAP in the last 168 hours. Lipid Profile: No results for input(s): CHOL, HDL, LDLCALC, TRIG, CHOLHDL, LDLDIRECT in the last 72 hours. Thyroid Function Tests: No results for input(s): TSH, T4TOTAL, FREET4, T3FREE, THYROIDAB in the last 72 hours. Anemia Panel: No results for input(s): VITAMINB12, FOLATE, FERRITIN, TIBC, IRON, RETICCTPCT in the last 72 hours. Sepsis Labs: No results for input(s): PROCALCITON, LATICACIDVEN in the last 168 hours.  Recent Results (from the past 240 hour(s))  Resp Panel by RT-PCR (Flu A&B, Covid)  Nasopharyngeal Swab     Status: None   Collection Time: 01/31/21 10:40 PM   Specimen: Nasopharyngeal Swab; Nasopharyngeal(NP) swabs in vial transport medium  Result Value Ref Range Status   SARS Coronavirus 2 by RT PCR NEGATIVE NEGATIVE Final    Comment: (NOTE) SARS-CoV-2 target nucleic acids are NOT DETECTED.  The SARS-CoV-2 RNA is generally detectable in upper respiratory specimens during the acute phase of infection. The lowest concentration of SARS-CoV-2 viral copies this assay can detect is 138 copies/mL. A negative result does not preclude SARS-Cov-2 infection and should not be used as the sole basis for treatment or other patient management decisions. A negative result may occur with  improper specimen collection/handling, submission of specimen other than nasopharyngeal swab, presence of viral mutation(s) within the areas targeted by this assay, and inadequate number of viral copies(<138 copies/mL). A negative result must be combined with clinical observations, patient history, and epidemiological information. The expected result is Negative.  Fact Sheet for Patients:  BloggerCourse.com  Fact Sheet for Healthcare Providers:  SeriousBroker.it  This test is no t yet approved or cleared by the [place] FDA and  has been authorized for detection and/or diagnosis of SARS-CoV-2 by FDA under an Emergency Use Authorization (EUA). This EUA will remain  in effect (meaning this test can be used) for the duration of the COVID-19 declaration under Section 564(b)(1) of the Act, 21 U.S.C.section 360bbb-3(b)(1), unless the authorization is terminated  or revoked sooner.       Influenza A by PCR NEGATIVE NEGATIVE Final   Influenza B by PCR NEGATIVE NEGATIVE Final    Comment: (NOTE) The Xpert Xpress SARS-CoV-2/FLU/RSV plus assay is intended as an aid in the diagnosis of influenza from Nasopharyngeal swab specimens and should not be  used as a sole basis for treatment. Nasal washings and aspirates are unacceptable for Xpert Xpress SARS-CoV-2/FLU/RSV testing.  Fact Sheet for Patients: BloggerCourse.com  Fact Sheet for Healthcare Providers: SeriousBroker.it  This test is not yet approved or cleared by the [place] FDA and has been authorized for detection and/or diagnosis of SARS-CoV-2 by FDA under an Emergency Use Authorization (EUA). This EUA will remain in effect (meaning this test can be used) for the duration of the COVID-19 declaration under Section 564(b)(1) of the Act,  21 U.S.C. section 360bbb-3(b)(1), unless the authorization is terminated or revoked.  Performed at [facility], [address], [place], [place] [address]      Radiology Studies: CT ABDOMEN PELVIS WO CONTRAST  Result Date: 01/31/2021 CLINICAL DATA:  Acute abdominal pain for several days EXAM: CT ABDOMEN AND PELVIS WITHOUT CONTRAST TECHNIQUE: Multidetector CT imaging of the abdomen and pelvis was performed following the standard protocol without IV contrast. COMPARISON:  None. FINDINGS: Lower chest: Small bilateral pleural effusions and mild dependent atelectasis. Hepatobiliary: Liver is within normal limits. Gallbladder is well distended with dependent density likely related to sludge. Pericholecystic fluid is noted although ascites is seen. Pancreas: Unremarkable. No pancreatic ductal dilatation or surrounding inflammatory changes. Spleen: Normal in size without focal abnormality. Adrenals/Urinary Tract: Adrenal glands are within normal limits. Kidneys show no renal calculi or definitive obstructive changes. Bladder is partially distended. Stomach/Bowel: Colon shows no obstructive or inflammatory changes. The appendix is not well appreciated although no inflammatory changes are seen. Vascular/Lymphatic: No significant vascular findings are present. No enlarged abdominal or  pelvic lymph nodes. Reproductive: Prostate is unremarkable. Other: Mild diffuse ascites is noted throughout the abdomen. Some generalized edema within the mesenteric fat is noted as well likely related to third spacing. Musculoskeletal: No acute or significant osseous findings. IMPRESSION: Small effusions and dependent atelectatic changes without focal confluent infiltrate. Mild ascites with reactive changes in the abdomen and pelvis. Changes are likely related to the patient's known chronic congestive failure. Gallbladder is well distended with pericholecystic fluid likely related to the underlying ascites. The need for further evaluation can be determined on a clinical basis. Electronically Signed   By: Alcide Clever M.D.   On: 01/31/2021 20:46   IR TUNNELED CENTRAL VENOUS CATHETER PLACEMENT  Result Date: 02/01/2021 INDICATION: 77 year old with acute kidney injury. Catheter needed for hemodialysis. EXAM: FLUOROSCOPIC AND ULTRASOUND GUIDED PLACEMENT OF A TUNNELED DIALYSIS CATHETER Physician: Rachelle Hora. Lowella Dandy, MD MEDICATIONS: Ancef 2 g; The antibiotic was administered within an appropriate time interval prior to skin puncture. ANESTHESIA/SEDATION: Versed 0.0 mg IV; Fentanyl 50 mcg IV; Moderate Sedation Time:  24 minutes The patient was continuously monitored during the procedure by the interventional radiology nurse under my direct supervision. FLUOROSCOPY TIME:  Fluoroscopy Time: 2 minutes, 24 seconds, 10 mGy COMPLICATIONS: None immediate. PROCEDURE: The procedure was explained to the patient. The risks and benefits of the procedure were discussed and the patient's questions were addressed. Informed consent was obtained from the patient. The patient was placed supine on the interventional table. Ultrasound confirmed a patent right internal jugular vein. Ultrasound images were obtained for documentation. The right neck and chest was prepped and draped in a sterile fashion. Maximal barrier sterile technique was  utilized including caps, mask, sterile gowns, sterile gloves, sterile drape, hand hygiene and skin antiseptic. The right neck was anesthetized with 1% lidocaine. A small incision was made with #11 blade scalpel. A 21 gauge needle directed into the right internal jugular vein with ultrasound guidance. A micropuncture dilator set was placed. A 19 cm tip to cuff Palindrome catheter was selected. The skin below the right clavicle was anesthetized and a small incision was made with an #11 blade scalpel. A subcutaneous tunnel was formed to the vein dermatotomy site. The catheter was brought through the tunnel. Micropuncture catheter was removed over a superstiff Amplatz wire. The vein dermatotomy site was dilated to accommodate a peel-away sheath. The catheter was placed through the peel-away sheath and directed into the central venous structures.  The tip of the catheter was placed at the superior cavoatrial junction with fluoroscopy. Fluoroscopic images were obtained for documentation. Both lumens were found to aspirate and flush well. The proper amount of heparin was flushed in both lumens. The vein dermatotomy site was closed using a single layer of absorbable suture and Dermabond. The catheter was secured to the skin using Prolene suture. IMPRESSION: Successful placement of a right jugular tunneled dialysis catheter using ultrasound and fluoroscopic guidance. Electronically Signed   By: Richarda Overlie M.D.   On: 02/01/2021 16:33   Scheduled Meds: . Chlorhexidine Gluconate Cloth  6 each Topical Q0600  . Chlorhexidine Gluconate Cloth  6 each Topical Q0600  . heparin sodium (porcine)       Continuous Infusions: . sodium chloride    . sodium chloride       LOS: 2 days   Time spent:  Azucena Fallen, DO [facility]  If 7PM-7AM, please contact night-coverage [email or web address]  02/02/2021, 4:05 PM

## 2021-02-02 NOTE — Progress Notes (Signed)
[facility] ROUNDING NOTE   Subjective:   Interval History: Is an otherwise healthy 40 year old gentleman with acute uremic symptoms over the past 4 days including nausea vomiting and diarrhea.  He also complains of some abdominal pain and discomfort.  He actively engages in bodybuilding and has been using creatine supplements.  There is no use nonsteroidal anti-inflammatory drugs he is admitted to decreasing urine output.  His blood pressure is not elevated.  He is not anemic.  His labs showed severe metabolic disarray with a glucose of 111 BUN 163 creatinine 13.7 calcium 7.4 CO2 14 calcium 7.3.  Of note his previous creatinine was 1.3 mg/dL on 2/95/2841.  Discussions with the patient and Dr. Malen Gauze, nephrology have been noted.  The plan is to undergo placement of IJ catheter and the session of dialysis 02/01/2021 noted to achieve correction of his metabolic abnormalities and improvements of his uremic symptoms.  There was some microscopic hematuria and serologies have been sent.  Of course these are pending at this time.  There is no evidence of any drug use.  And CK levels were not elevated to any extent.  Blood pressure 121/68 pulse 51 temperature 98.8 O2 sats 96% room air      Home medications: Eliquis 5 mg twice daily, metoprolol 25 mg daily, creatinine powder 1 scoop daily, glutamine powder 1 scoop daily, multivitamins 1 daily  Sodium 136 potassium 5 chloride 96 CO2 21 BUN 148 creatinine 14.68 glucose 121 calcium 7 albumin 2.7  No recorded urine output     Objective:  Vital signs in last 24 hours:  Temp:  [98.1 F (36.7 C)-99.2 F (37.3 C)] 98.8 F (37.1 C) (04/22 0754) Pulse Rate:  [40-66] 41 (04/22 0754) Resp:  [15-22] 18 (04/22 0754) BP: (122-153)/(57-76) 125/69 (04/22 0754) SpO2:  [93 %-98 %] 95 % (04/22 0754) Weight:  [85.1 kg-88 kg] 85.1 kg (04/22 0446)  Weight change: 4.4 kg Filed Weights   02/01/21 1610 02/01/21 1815 02/02/21 0446  Weight: 88 kg 88 kg  85.1 kg    Intake/Output: I/O last 3 completed shifts: In: 1547.6 [P.O.:45; I.V.:1208; IV Piggyback:294.6] Out: 0    Intake/Output this shift:  No intake/output data recorded.  Alert pleasant gentleman nondistressed CVS- RRR no murmurs rubs gallops RS- CTA no wheezes or rales ABD- BS present soft non-distended EXT- no edema   Basic Metabolic Panel: Recent Labs  Lab 01/31/21 1912 01/31/21 1928 02/01/21 0210 02/01/21 0623 02/01/21 2254 02/02/21 0234  NA  --  137 138  138  138 138 136 136  K  --  7.4* 6.3*  6.3*  6.2* 6.0* 4.9 5.0  CL  --  102 103  102  102 100 99 96*  CO2  --  14* 16*  16*  18* 16* 25 21*  GLUCOSE  --  111* 112*  113*  114* 125* 134* 121*  BUN  --  163* 166*  170*  170* 173* 145* 148*  CREATININE  --  13.71* 14.06*  13.86*  14.18* 14.67* 13.97* 14.68*  CALCIUM  --  7.3* 7.2*  7.2*  7.1* 7.2* 7.0* 7.0*  MG 3.0*  --  2.6*  --   --   --   PHOS  --   --  9.9*  9.8*  --  8.3*  --     Liver Function Tests: Recent Labs  Lab 01/31/21 1647 01/31/21 1928 02/01/21 0210 02/01/21 2254 02/02/21 0234  AST 1,427* 1,188* 847*  --  448*  ALT  5,044* 4,146* 3,224*  --  1,963*  ALKPHOS 133* 121 100  --  84  BILITOT 9.7* 9.1* 8.6*  --  4.4*  PROT 6.5 6.0* 5.2*  --  4.7*  ALBUMIN 4.0 3.8 3.1* 2.8* 2.7*   Recent Labs  Lab 01/31/21 1647  LIPASE 30   Recent Labs  Lab 01/31/21 1928  AMMONIA 32    CBC: Recent Labs  Lab 01/31/21 1647 02/01/21 0210 02/02/21 0234  WBC 6.7 5.2 4.8  HGB 13.8 11.5* 10.5*  HCT 39.8 33.4* 28.4*  MCV 90.5 90.5 85.5  PLT 134* 89* 75*    Cardiac Enzymes: Recent Labs  Lab 01/31/21 1912 02/01/21 0210 02/02/21 0234  CKTOTAL 571* 450* 291    BNP: Invalid input(s): POCBNP  CBG: No results for input(s): GLUCAP in the last 168 hours.  Microbiology: Results for orders placed or performed during the hospital encounter of 01/31/21  Resp Panel by RT-PCR (Flu A&B, Covid) Nasopharyngeal Swab     Status: None    Collection Time: 01/31/21 10:40 PM   Specimen: Nasopharyngeal Swab; Nasopharyngeal(NP) swabs in vial transport medium  Result Value Ref Range Status   SARS Coronavirus 2 by RT PCR NEGATIVE NEGATIVE Final    Comment: (NOTE) SARS-CoV-2 target nucleic acids are NOT DETECTED.  The SARS-CoV-2 RNA is generally detectable in upper respiratory specimens during the acute phase of infection. The lowest concentration of SARS-CoV-2 viral copies this assay can detect is 138 copies/mL. A negative result does not preclude SARS-Cov-2 infection and should not be used as the sole basis for treatment or other patient management decisions. A negative result may occur with  improper specimen collection/handling, submission of specimen other than nasopharyngeal swab, presence of viral mutation(s) within the areas targeted by this assay, and inadequate number of viral copies(<138 copies/mL). A negative result must be combined with clinical observations, patient history, and epidemiological information. The expected result is Negative.  Fact Sheet for Patients:  BloggerCourse.com  Fact Sheet for Healthcare Providers:  SeriousBroker.it  This test is no t yet approved or cleared by the [place] FDA and  has been authorized for detection and/or diagnosis of SARS-CoV-2 by FDA under an Emergency Use Authorization (EUA). This EUA will remain  in effect (meaning this test can be used) for the duration of the COVID-19 declaration under Section 564(b)(1) of the Act, 21 U.S.C.section 360bbb-3(b)(1), unless the authorization is terminated  or revoked sooner.       Influenza A by PCR NEGATIVE NEGATIVE Final   Influenza B by PCR NEGATIVE NEGATIVE Final    Comment: (NOTE) The Xpert Xpress SARS-CoV-2/FLU/RSV plus assay is intended as an aid in the diagnosis of influenza from Nasopharyngeal swab specimens and should not be used as a sole basis for treatment.  Nasal washings and aspirates are unacceptable for Xpert Xpress SARS-CoV-2/FLU/RSV testing.  Fact Sheet for Patients: BloggerCourse.com  Fact Sheet for Healthcare Providers: SeriousBroker.it  This test is not yet approved or cleared by the [place] FDA and has been authorized for detection and/or diagnosis of SARS-CoV-2 by FDA under an Emergency Use Authorization (EUA). This EUA will remain in effect (meaning this test can be used) for the duration of the COVID-19 declaration under Section 564(b)(1) of the Act, 21 U.S.C. section 360bbb-3(b)(1), unless the authorization is terminated or revoked.  Performed at [facility], [address], [place], [place] [address]     Coagulation Studies: Recent Labs    01/31/21 1912 02/01/21 0210  LABPROT 14.2 14.6  INR 1.1 1.1    Urinalysis: Recent Labs    01/31/21 2130  COLORURINE YELLOW  LABSPEC 1.011  PHURINE 5.0  GLUCOSEU 50*  HGBUR SMALL*  BILIRUBINUR NEGATIVE  KETONESUR NEGATIVE  PROTEINUR 100*  NITRITE NEGATIVE  LEUKOCYTESUR MODERATE*      Imaging: CT ABDOMEN PELVIS WO CONTRAST  Result Date: 01/31/2021 CLINICAL DATA:  Acute abdominal pain for several days EXAM: CT ABDOMEN AND PELVIS WITHOUT CONTRAST TECHNIQUE: Multidetector CT imaging of the abdomen and pelvis was performed following the standard protocol without IV contrast. COMPARISON:  None. FINDINGS: Lower chest: Small bilateral pleural effusions and mild dependent atelectasis. Hepatobiliary: Liver is within normal limits. Gallbladder is well distended with dependent density likely related to sludge. Pericholecystic fluid is noted although ascites is seen. Pancreas: Unremarkable. No pancreatic ductal dilatation or surrounding inflammatory changes. Spleen: Normal in size without focal abnormality. Adrenals/Urinary Tract: Adrenal glands are within normal limits. Kidneys show no renal calculi or  definitive obstructive changes. Bladder is partially distended. Stomach/Bowel: Colon shows no obstructive or inflammatory changes. The appendix is not well appreciated although no inflammatory changes are seen. Vascular/Lymphatic: No significant vascular findings are present. No enlarged abdominal or pelvic lymph nodes. Reproductive: Prostate is unremarkable. Other: Mild diffuse ascites is noted throughout the abdomen. Some generalized edema within the mesenteric fat is noted as well likely related to third spacing. Musculoskeletal: No acute or significant osseous findings. IMPRESSION: Small effusions and dependent atelectatic changes without focal confluent infiltrate. Mild ascites with reactive changes in the abdomen and pelvis. Changes are likely related to the patient's known chronic congestive failure. Gallbladder is well distended with pericholecystic fluid likely related to the underlying ascites. The need for further evaluation can be determined on a clinical basis. Electronically Signed   By: Alcide CleverMark  Lukens M.D.   On: 01/31/2021 20:46   IR TUNNELED CENTRAL VENOUS CATHETER PLACEMENT  Result Date: 02/01/2021 INDICATION: 40 year old with acute kidney injury. Catheter needed for hemodialysis. EXAM: FLUOROSCOPIC AND ULTRASOUND GUIDED PLACEMENT OF A TUNNELED DIALYSIS CATHETER Physician: Rachelle HoraAdam R. Lowella DandyHenn, MD MEDICATIONS: Ancef 2 g; The antibiotic was administered within an appropriate time interval prior to skin puncture. ANESTHESIA/SEDATION: Versed 0.0 mg IV; Fentanyl 50 mcg IV; Moderate Sedation Time:  24 minutes The patient was continuously monitored during the procedure by the interventional radiology nurse under my direct supervision. FLUOROSCOPY TIME:  Fluoroscopy Time: 2 minutes, 24 seconds, 10 mGy COMPLICATIONS: None immediate. PROCEDURE: The procedure was explained to the patient. The risks and benefits of the procedure were discussed and the patient's questions were addressed. Informed consent was obtained  from the patient. The patient was placed supine on the interventional table. Ultrasound confirmed a patent right internal jugular vein. Ultrasound images were obtained for documentation. The right neck and chest was prepped and draped in a sterile fashion. Maximal barrier sterile technique was utilized including caps, mask, sterile gowns, sterile gloves, sterile drape, hand hygiene and skin antiseptic. The right neck was anesthetized with 1% lidocaine. A small incision was made with #11 blade scalpel. A 21 gauge needle directed into the right internal jugular vein with ultrasound guidance. A micropuncture dilator set was placed. A 19 cm tip to cuff Palindrome catheter was selected. The skin below the right clavicle was anesthetized and a small incision was made with an #11 blade scalpel. A subcutaneous tunnel was formed to the vein dermatotomy site. The catheter was brought through the tunnel. Micropuncture catheter was removed over a superstiff Amplatz wire. The vein dermatotomy site was dilated to accommodate  a peel-away sheath. The catheter was placed through the peel-away sheath and directed into the central venous structures. The tip of the catheter was placed at the superior cavoatrial junction with fluoroscopy. Fluoroscopic images were obtained for documentation. Both lumens were found to aspirate and flush well. The proper amount of heparin was flushed in both lumens. The vein dermatotomy site was closed using a single layer of absorbable suture and Dermabond. The catheter was secured to the skin using Prolene suture. IMPRESSION: Successful placement of a right jugular tunneled dialysis catheter using ultrasound and fluoroscopic guidance. Electronically Signed   By: Richarda Overlie M.D.   On: 02/01/2021 16:33     Medications:   . sodium chloride    . sodium chloride     . Chlorhexidine Gluconate Cloth  6 each Topical Q0600  . Chlorhexidine Gluconate Cloth  6 each Topical Q0600   sodium chloride,  sodium chloride, acetaminophen **OR** acetaminophen, alteplase, heparin, HYDROmorphone (DILAUDID) injection, lidocaine (PF), lidocaine-prilocaine, LORazepam, pentafluoroprop-tetrafluoroeth  Assessment/ Plan:  1. Hyperkalemia -seems to be slightly improved.  Continue Lokelma and treatment of metabolic acidosis with IV bicarbonate.  This should improve intracellular shifts.  Improved  2.  AKI  Recent pre-renal insults of n/v/d - nonbloody diarrhea.  UA with 100 mg/dL protein, 9-16 RBC.  CK only mildly elevated.  Platelets mildly reduced at 134 so TTP/HUS felt unlikely.  Hepatitis panel nonreactive.  No evidence of any obstruction.  Mild ascites noted.  Baseline creatinine appears to be in normal range.  Serologies have been sent.  Tolerated his first dialysis treatment 02/01/2021 next dialysis treatment 02/02/2021.  Still oliguric anuric.  We will continue to follow 3 Bradycardia  Concurrent hyperkalemia.  This may or may not be contributory.  Patient is athletic and may have a resting bradycardia 4. Metabolic acidosis  improved with dialysis 5.Nausea/vomiting/diarrhea Supportive measures per primary team and GI is consulted.  I believe this probably secondary to uremia 6. Gallbladder distention.  Appears to have some transaminitis we will continue to follow.  Hepatitis studies appear to be negative at this point. - GI consulted  7.Hypertrophic cardiomyopathy -continue IV hydration    LOS: 2 Garnetta Buddy @TODAY @10 :22 AM

## 2021-02-03 LAB — CBC
HCT: 32.4 % — ABNORMAL LOW (ref 39.0–52.0)
Hemoglobin: 11.5 g/dL — ABNORMAL LOW (ref 13.0–17.0)
MCH: 31.3 pg (ref 26.0–34.0)
MCHC: 35.5 g/dL (ref 30.0–36.0)
MCV: 88.3 fL (ref 80.0–100.0)
Platelets: 72 10*3/uL — ABNORMAL LOW (ref 150–400)
RBC: 3.67 MIL/uL — ABNORMAL LOW (ref 4.22–5.81)
RDW: 17.6 % — ABNORMAL HIGH (ref 11.5–15.5)
WBC: 4.7 10*3/uL (ref 4.0–10.5)
nRBC: 0 % (ref 0.0–0.2)

## 2021-02-03 LAB — COMPREHENSIVE METABOLIC PANEL
ALT: 1158 U/L — ABNORMAL HIGH (ref 0–44)
AST: 254 U/L — ABNORMAL HIGH (ref 15–41)
Albumin: 2.6 g/dL — ABNORMAL LOW (ref 3.5–5.0)
Alkaline Phosphatase: 78 U/L (ref 38–126)
Anion gap: 15 (ref 5–15)
BUN: 147 mg/dL — ABNORMAL HIGH (ref 6–20)
CO2: 23 mmol/L (ref 22–32)
Calcium: 7.3 mg/dL — ABNORMAL LOW (ref 8.9–10.3)
Chloride: 98 mmol/L (ref 98–111)
Creatinine, Ser: 15.13 mg/dL — ABNORMAL HIGH (ref 0.61–1.24)
GFR, Estimated: 4 mL/min — ABNORMAL LOW (ref >60)
Glucose, Bld: 93 mg/dL (ref 70–99)
Potassium: 5.8 mmol/L — ABNORMAL HIGH (ref 3.5–5.1)
Sodium: 136 mmol/L (ref 135–145)
Total Bilirubin: 2.4 mg/dL — ABNORMAL HIGH (ref 0.3–1.2)
Total Protein: 4.8 g/dL — ABNORMAL LOW (ref 6.5–8.1)

## 2021-02-03 LAB — C4 COMPLEMENT: Complement C4, Body Fluid: 12 mg/dL (ref 12–38)

## 2021-02-03 LAB — CK: Total CK: 220 U/L (ref 49–397)

## 2021-02-03 LAB — MPO/PR-3 (ANCA) ANTIBODIES
ANCA Proteinase 3: <3.5 U/mL (ref 0.0–3.5)
Myeloperoxidase Abs: <9 U/mL (ref 0.0–9.0)

## 2021-02-03 LAB — C3 COMPLEMENT: C3 Complement: 91 mg/dL (ref 82–167)

## 2021-02-03 MED ORDER — CHLORHEXIDINE GLUCONATE CLOTH 2 % EX PADS
6.0000 | MEDICATED_PAD | Freq: Every day | CUTANEOUS | Status: DC
  Administered 2021-02-03 – 2021-02-05 (×2): 6 via TOPICAL

## 2021-02-03 NOTE — Progress Notes (Signed)
[facility] ROUNDING NOTE   Subjective:   Interval History: Is an otherwise healthy 40 year old gentleman with acute uremic symptoms over the past 4 days including nausea vomiting and diarrhea.  He also complains of some abdominal pain and discomfort.  He actively engages in bodybuilding and has been using creatine supplements.  There is no use nonsteroidal anti-inflammatory drugs he is admitted to decreasing urine output.  His blood pressure is not elevated.  He is not anemic.  His labs showed severe metabolic disarray with a glucose of 111 BUN 163 creatinine 13.7 calcium 7.4 CO2 14 calcium 7.3.  Of note his previous creatinine was 1.3 mg/dL on 3/54/6568.     Patient is undergoing dialysis treatment 02/01/2021 and 02/02/2021.  We will continue dialysis 02/04/2019.  There was some microscopic hematuria and serologies have been sent.  Of course these are pending at this time.  There is no evidence of any drug use.  And CK levels were not elevated to any extent.  Blood pressure 131/80 pulse 79 temperature 98.3 O2 sats 99% room air  Sodium 136 potassium 5.8 chloride 98 CO2 23 BUN 147 creatinine 15.13 calcium 7.3 AST 254 ALT 1158 hemoglobin 11.5   Home medications: Eliquis 5 mg twice daily, metoprolol 25 mg daily, creatinine powder 1 scoop daily, glutamine powder 1 scoop daily, multivitamins 1 daily  No recorded urine output     Objective:  Vital signs in last 24 hours:  Temp:  [97.6 F (36.4 C)-98.3 F (36.8 C)] 98.3 F (36.8 C) (04/23 0811) Pulse Rate:  [35-39] 39 (04/23 0811) Resp:  [15-20] 18 (04/23 0811) BP: (124-156)/(80-105) 131/80 (04/23 0811) SpO2:  [94 %-99 %] 99 % (04/23 0811) Weight:  [87.6 kg] 87.6 kg (04/22 1620)  Weight change: -0.4 kg Filed Weights   02/02/21 0446 02/02/21 1410 02/02/21 1620  Weight: 85.1 kg 87.6 kg 87.6 kg    Intake/Output: No intake/output data recorded.   Intake/Output this shift:  No intake/output data recorded.  Alert pleasant  gentleman nondistressed CVS- RRR no murmurs rubs gallops RS- CTA no wheezes or rales ABD- BS present soft non-distended EXT- no edema   Basic Metabolic Panel: Recent Labs  Lab 01/31/21 1912 01/31/21 1928 02/01/21 0210 02/01/21 0623 02/01/21 2254 02/02/21 0234 02/03/21 0308  NA  --    < > 138  138  138 138 136 136 136  K  --    < > 6.3*  6.3*  6.2* 6.0* 4.9 5.0 5.8*  CL  --    < > 103  102  102 100 99 96* 98  CO2  --    < > 16*  16*  18* 16* 25 21* 23  GLUCOSE  --    < > 112*  113*  114* 125* 134* 121* 93  BUN  --    < > 166*  170*  170* 173* 145* 148* 147*  CREATININE  --    < > 14.06*  13.86*  14.18* 14.67* 13.97* 14.68* 15.13*  CALCIUM  --    < > 7.2*  7.2*  7.1* 7.2* 7.0* 7.0* 7.3*  MG 3.0*  --  2.6*  --   --   --   --   PHOS  --   --  9.9*  9.8*  --  8.3*  --   --    < > = values in this interval not displayed.    Liver Function Tests: Recent Labs  Lab 01/31/21 1647 01/31/21 1928 02/01/21 0210  02/01/21 2254 02/02/21 0234 02/03/21 0308  AST 1,427* 1,188* 847*  --  448* 254*  ALT 5,044* 4,146* 3,224*  --  1,963* 1,158*  ALKPHOS 133* 121 100  --  84 78  BILITOT 9.7* 9.1* 8.6*  --  4.4* 2.4*  PROT 6.5 6.0* 5.2*  --  4.7* 4.8*  ALBUMIN 4.0 3.8 3.1* 2.8* 2.7* 2.6*   Recent Labs  Lab 01/31/21 1647  LIPASE 30   Recent Labs  Lab 01/31/21 1928  AMMONIA 32    CBC: Recent Labs  Lab 01/31/21 1647 02/01/21 0210 02/02/21 0234 02/03/21 0308  WBC 6.7 5.2 4.8 4.7  HGB 13.8 11.5* 10.5* 11.5*  HCT 39.8 33.4* 28.4* 32.4*  MCV 90.5 90.5 85.5 88.3  PLT 134* 89* 75* 72*    Cardiac Enzymes: Recent Labs  Lab 01/31/21 1912 02/01/21 0210 02/02/21 0234 02/03/21 0308  CKTOTAL 571* 450* 291 220    BNP: Invalid input(s): POCBNP  CBG: No results for input(s): GLUCAP in the last 168 hours.  Microbiology: Results for orders placed or performed during the hospital encounter of 01/31/21  Resp Panel by RT-PCR (Flu A&B, Covid) Nasopharyngeal Swab      Status: None   Collection Time: 01/31/21 10:40 PM   Specimen: Nasopharyngeal Swab; Nasopharyngeal(NP) swabs in vial transport medium  Result Value Ref Range Status   SARS Coronavirus 2 by RT PCR NEGATIVE NEGATIVE Final    Comment: (NOTE) SARS-CoV-2 target nucleic acids are NOT DETECTED.  The SARS-CoV-2 RNA is generally detectable in upper respiratory specimens during the acute phase of infection. The lowest concentration of SARS-CoV-2 viral copies this assay can detect is 138 copies/mL. A negative result does not preclude SARS-Cov-2 infection and should not be used as the sole basis for treatment or other patient management decisions. A negative result may occur with  improper specimen collection/handling, submission of specimen other than nasopharyngeal swab, presence of viral mutation(s) within the areas targeted by this assay, and inadequate number of viral copies(<138 copies/mL). A negative result must be combined with clinical observations, patient history, and epidemiological information. The expected result is Negative.  Fact Sheet for Patients:  BloggerCourse.com  Fact Sheet for Healthcare Providers:  SeriousBroker.it  This test is no t yet approved or cleared by the [place] FDA and  has been authorized for detection and/or diagnosis of SARS-CoV-2 by FDA under an Emergency Use Authorization (EUA). This EUA will remain  in effect (meaning this test can be used) for the duration of the COVID-19 declaration under Section 564(b)(1) of the Act, 21 U.S.C.section 360bbb-3(b)(1), unless the authorization is terminated  or revoked sooner.       Influenza A by PCR NEGATIVE NEGATIVE Final   Influenza B by PCR NEGATIVE NEGATIVE Final    Comment: (NOTE) The Xpert Xpress SARS-CoV-2/FLU/RSV plus assay is intended as an aid in the diagnosis of influenza from Nasopharyngeal swab specimens and should not be used as a sole  basis for treatment. Nasal washings and aspirates are unacceptable for Xpert Xpress SARS-CoV-2/FLU/RSV testing.  Fact Sheet for Patients: BloggerCourse.com  Fact Sheet for Healthcare Providers: SeriousBroker.it  This test is not yet approved or cleared by the [place] FDA and has been authorized for detection and/or diagnosis of SARS-CoV-2 by FDA under an Emergency Use Authorization (EUA). This EUA will remain in effect (meaning this test can be used) for the duration of the COVID-19 declaration under Section 564(b)(1) of the Act, 21 U.S.C. section 360bbb-3(b)(1), unless the authorization is terminated or revoked.  Performed at [facility], [address], [place], [place] [address]     Coagulation Studies: Recent Labs    01/31/21 1912 02/01/21 0210  LABPROT 14.2 14.6  INR 1.1 1.1    Urinalysis: Recent Labs    01/31/21 2130  COLORURINE YELLOW  LABSPEC 1.011  PHURINE 5.0  GLUCOSEU 50*  HGBUR SMALL*  BILIRUBINUR NEGATIVE  KETONESUR NEGATIVE  PROTEINUR 100*  NITRITE NEGATIVE  LEUKOCYTESUR MODERATE*      Imaging: IR TUNNELED CENTRAL VENOUS CATHETER PLACEMENT  Result Date: 02/01/2021 INDICATION: 28 year old with acute kidney injury. Catheter needed for hemodialysis. EXAM: FLUOROSCOPIC AND ULTRASOUND GUIDED PLACEMENT OF A TUNNELED DIALYSIS CATHETER Physician: Rachelle Hora. Lowella Dandy, MD MEDICATIONS: Ancef 2 g; The antibiotic was administered within an appropriate time interval prior to skin puncture. ANESTHESIA/SEDATION: Versed 0.0 mg IV; Fentanyl 50 mcg IV; Moderate Sedation Time:  24 minutes The patient was continuously monitored during the procedure by the interventional radiology nurse under my direct supervision. FLUOROSCOPY TIME:  Fluoroscopy Time: 2 minutes, 24 seconds, 10 mGy COMPLICATIONS: None immediate. PROCEDURE: The procedure was explained to the patient. The risks and benefits of the procedure  were discussed and the patient's questions were addressed. Informed consent was obtained from the patient. The patient was placed supine on the interventional table. Ultrasound confirmed a patent right internal jugular vein. Ultrasound images were obtained for documentation. The right neck and chest was prepped and draped in a sterile fashion. Maximal barrier sterile technique was utilized including caps, mask, sterile gowns, sterile gloves, sterile drape, hand hygiene and skin antiseptic. The right neck was anesthetized with 1% lidocaine. A small incision was made with #11 blade scalpel. A 21 gauge needle directed into the right internal jugular vein with ultrasound guidance. A micropuncture dilator set was placed. A 19 cm tip to cuff Palindrome catheter was selected. The skin below the right clavicle was anesthetized and a small incision was made with an #11 blade scalpel. A subcutaneous tunnel was formed to the vein dermatotomy site. The catheter was brought through the tunnel. Micropuncture catheter was removed over a superstiff Amplatz wire. The vein dermatotomy site was dilated to accommodate a peel-away sheath. The catheter was placed through the peel-away sheath and directed into the central venous structures. The tip of the catheter was placed at the superior cavoatrial junction with fluoroscopy. Fluoroscopic images were obtained for documentation. Both lumens were found to aspirate and flush well. The proper amount of heparin was flushed in both lumens. The vein dermatotomy site was closed using a single layer of absorbable suture and Dermabond. The catheter was secured to the skin using Prolene suture. IMPRESSION: Successful placement of a right jugular tunneled dialysis catheter using ultrasound and fluoroscopic guidance. Electronically Signed   By: Richarda Overlie M.D.   On: 02/01/2021 16:33     Medications:    . Chlorhexidine Gluconate Cloth  6 each Topical Q0600  . Chlorhexidine Gluconate Cloth  6  each Topical Q0600   acetaminophen **OR** acetaminophen, HYDROmorphone (DILAUDID) injection, LORazepam  Assessment/ Plan:    1.  Acute kidney injury.  Recent pre-renal insults of n/v/d - nonbloody diarrhea.  UA with 100 mg/dL protein, 2-35 RBC.  CK only mildly elevated.  Platelets mildly reduced at 134 so TTP/HUS felt unlikely.  Hepatitis panel nonreactive.  No evidence of any obstruction.  Mild ascites noted.  Baseline creatinine appears to be in normal range.  Serologies have been sent.  Dialysis #1 02/01/2021 dialysis #2 02/02/2021 dialysis #3 planned 02/03/21 still  oliguric anuric.  We will continue to follow.  The does not appear to be any recovery of renal function.  3 Bradycardia  Concurrent hyperkalemia.  This may or may not be contributory.  Patient is athletic and may have a resting bradycardia 4. Metabolic acidosis  improved with dialysis 5.Nausea/vomiting/diarrhea Supportive measures per primary team and GI is consulted.  I believe this probably secondary to uremia 6. Gallbladder distention.  Appears to have some transaminitis we will continue to follow.  Hepatitis studies appear to be negative at this point. - GI consulted  7.Hypertrophic cardiomyopathy -continue IV hydration    LOS: 3 Garnetta BuddyMartin W Linn Goetze @TODAY @8 :47 AM

## 2021-02-03 NOTE — Progress Notes (Signed)
PROGRESS NOTE   Evan Vargas  ZOX:[id number] DOB: [birth year] DOA: 01/31/2021 PCP: Pcp, No   Brief Narrative:  Evan BeganJorral Gras is a [age] y.o. male with medical history significant for paroxysmal atrial fibrillation not on chronic anticoagulation, chronic diastolic heart failure, who is admitted to [facility] on 01/31/2021 with acute renal failure after presenting from [facility] complaining of profound nausea vomiting and diarrhea without any signs or symptoms of bleeding.  Patient is a known bodybuilder, recently attempting to dehydrate in preparation for an event, cutting fluids while taking protein supplements.  Nephrology consulted recommending transfer to [facility] for further evaluation and likely dialysis given profoundly elevated creatinine and potassium.  Patient tolerating dialysis quite well thus far, labs continue to improve drastically but not yet back to baseline.  Unfortunately patient's creatinine and BUN are somewhat stable and do not appear to be wholly improved by dialysis.  Unclear at this time whether or not patient will be able to come off dialysis in the interim or will need to be scheduled for dialysis in hopes that his renal function returns in the near future.  Assessment & Plan:   Principal Problem:   Acute renal failure (ARF) (HCC) Active Problems:   Hyperkalemia   High anion gap metabolic acidosis   Nausea & vomiting   Diarrhea   Transaminitis   Hyperbilirubinemia   Paroxysmal atrial fibrillation (HCC)  Acute profound kidney injury/renal failure Likely provoked by diet, lifestyle and dehydration, POA - Nephrology consulted, appreciate insight and recommendations - Temporary dialysis catheter placement 02/01/2021 - Repeat dialysis per nephrology today given ongoing hyperkalemia volume status with elevated creatinine and BUN - High-protein diets have been shown to be somewhat detrimental to chronic kidney disease patients, again unclear what kind of  supplements the patient is on which could contribute to kidney injury as well -Urine output continues to be minimal, BUN and creatinine remain markedly elevated   Profound hyperkalemia, symptomatic, POA, stabilizing -Improving with dialysis, holding any further temporizing measures at this time Lab Results  Component Value Date   K 5.8 (H) 02/03/2021   Concurrent intractable nausea vomiting diarrhea likely in the setting of above, resolved -Resolved with temporizing measures of hyperkalemia and supportive care -Increase p.o. intake  Acutely elevated LFTs, resolving Thrombocytopenia, ongoing - Likely secondary to above - Labs downtrending appropriately, continue to follow - hold off on GI consult - CT abdomen shows mild ascites with gallbladder distention and pericholecystic fluid CMP Latest Ref Rng & Units 02/03/2021 02/02/2021 02/01/2021  Glucose 70 - 99 mg/dL 93 811(B121(H) 147(W134(H)  BUN 6 - 20 mg/dL 295(A147(H) 213(Y148(H) 865(H145(H)  Creatinine 0.61 - 1.24 mg/dL 84.69(G15.13(H) 29.52(W14.68(H) 41.32(G13.97(H)  Sodium 135 - 145 mmol/L 136 136 136  Potassium 3.5 - 5.1 mmol/L 5.8(H) 5.0 4.9  Chloride 98 - 111 mmol/L 98 96(L) 99  CO2 22 - 32 mmol/L 23 21(L) 25  Calcium 8.9 - 10.3 mg/dL 7.3(L) 7.0(L) 7.0(L)  Total Protein 6.5 - 8.1 g/dL 4.8(L) 4.7(L) -  Total Bilirubin 0.3 - 1.2 mg/dL 2.4(H) 4.4(H) -  Alkaline Phos 38 - 126 U/L 78 84 -  AST 15 - 41 U/L 254(H) 448(H) -  ALT 0 - 44 U/L 1,158(H) 1,963(H) -   Anion gap metabolic acidosis in the setting of above and uremia, resolving -In the setting of uremia -continues to improve with dialysis  Elevated CPK/rhabdomyolysis -Continue IV fluids, likely in the setting of exercise, profound dehydration, renal failure  Paroxysmal atrial fibrillation, known history, without RVR  Asymptomatic bradycardia: -CHA2DS2-VASc of 1, no indication for anticoagulation -Continue to follow clinically, on telemetry given above -Asymptomatic bradycardic in the setting of increased cardiac  reserves given lifestyle and workout regimen (typically seen in high endurance athletes)  Chronic diastolic heart failure -Continue to follow clinically, mild ascites on incoming imaging, appears euvolemic on exam today -Volume control with dialysis as above  DVT prophylaxis:  SCDs only in the setting of acute liver failure, thrombocytopenia Code Status: Full code Family Communication:  Mother and sister at bedside Status is: Inpatient  Dispo: The patient is from: Home              Anticipated d/c is to: To be determined              Anticipated d/c date is: >72h              Patient currently not medically stable for discharge  Consultants:   Nephrology  Procedures:   Dialysis catheter placement and dialysis initiation 02/01/2021  Antimicrobials:  None indicated  Subjective: No acute issues or events overnight, patient indicates he feels markedly improved from admission but not yet back to baseline still somewhat fatigued, poor p.o. intake and minimal urine output  Objective: Vitals:   02/03/21 0002 02/03/21 0202 02/03/21 0402 02/03/21 0811  BP: 124/80 129/81 126/84 131/80  Pulse: (!) 36 (!) 35 (!) 38 (!) 39  Resp: Temp: 97.7 F (36.5 C) 97.8 F (36.6 C) 97.8 F (36.6 C) 98.3 F (36.8 C)  TempSrc: Oral Oral Oral Oral  SpO2: 98% 99% 97% 99%  Weight:      Height:        Intake/Output Summary (Last 24 hours) at 02/03/2021 0835 Last data filed at 02/02/2021 1620 Gross per 24 hour  Intake --  Output 0 ml  Net 0 ml   Filed Weights   02/02/21 0446 02/02/21 1410 02/02/21 1620  Weight: 85.1 kg 87.6 kg 87.6 kg    Examination:  General exam: Appears calm and comfortable  Respiratory system: Clear to auscultation. Respiratory effort normal. Cardiovascular system: S1 & S2 heard, RRR. No JVD, murmurs, rubs, gallops or clicks. No pedal edema. Gastrointestinal system: Abdomen is nondistended, soft and nontender. No organomegaly or masses felt. Normal  bowel sounds heard. Central nervous system: Alert and oriented. No focal neurological deficits. Extremities: Symmetric 5 x 5 power. Skin: No rashes, lesions or ulcers Psychiatry: Judgement and insight appear normal. Mood & affect appropriate.     Data Reviewed: I have personally reviewed following labs and imaging studies  CBC: Recent Labs  Lab 01/31/21 1647 02/01/21 0210 02/02/21 0234 02/03/21 0308  WBC 6.7 5.2 4.8 4.7  HGB 13.8 11.5* 10.5* 11.5*  HCT 39.8 33.4* 28.4* 32.4*  MCV 90.5 90.5 85.5 88.3  PLT 134* 89* 75* 72*   Basic Metabolic Panel: Recent Labs  Lab 01/31/21 1912 01/31/21 1928 02/01/21 0210 02/01/21 0623 02/01/21 2254 02/02/21 0234 02/03/21 0308  NA  --    < > 138  138  138 138 136 136 136  K  --    < > 6.3*  6.3*  6.2* 6.0* 4.9 5.0 5.8*  CL  --    < > 103  102  102 100 99 96* 98  CO2  --    < > 16*  16*  18* 16* 25 21* 23  GLUCOSE  --    < > 112*  113*  114* 125* 134* 121* 93  BUN  --    < >  166*  170*  170* 173* 145* 148* 147*  CREATININE  --    < > 14.06*  13.86*  14.18* 14.67* 13.97* 14.68* 15.13*  CALCIUM  --    < > 7.2*  7.2*  7.1* 7.2* 7.0* 7.0* 7.3*  MG 3.0*  --  2.6*  --   --   --   --   PHOS  --   --  9.9*  9.8*  --  8.3*  --   --    < > = values in this interval not displayed.   GFR: Estimated Creatinine Clearance: 7.1 mL/min (A) (by C-G formula based on SCr of 15.13 mg/dL (H)). Liver Function Tests: Recent Labs  Lab 01/31/21 1647 01/31/21 1928 02/01/21 0210 02/01/21 2254 02/02/21 0234 02/03/21 0308  AST 1,427* 1,188* 847*  --  448* 254*  ALT 5,044* 4,146* 3,224*  --  1,963* 1,158*  ALKPHOS 133* 121 100  --  84 78  BILITOT 9.7* 9.1* 8.6*  --  4.4* 2.4*  PROT 6.5 6.0* 5.2*  --  4.7* 4.8*  ALBUMIN 4.0 3.8 3.1* 2.8* 2.7* 2.6*   Recent Labs  Lab 01/31/21 1647  LIPASE 30   Recent Labs  Lab 01/31/21 1928  AMMONIA 32   Coagulation Profile: Recent Labs  Lab 01/31/21 1912 02/01/21 0210  INR 1.1 1.1    Cardiac Enzymes: Recent Labs  Lab 01/31/21 1912 02/01/21 0210 02/02/21 0234 02/03/21 0308  CKTOTAL 571* 450* 291 220   BNP (last 3 results) No results for input(s): PROBNP in the last 8760 hours. HbA1C: No results for input(s): HGBA1C in the last 72 hours. CBG: No results for input(s): GLUCAP in the last 168 hours. Lipid Profile: No results for input(s): CHOL, HDL, LDLCALC, TRIG, CHOLHDL, LDLDIRECT in the last 72 hours. Thyroid Function Tests: No results for input(s): TSH, T4TOTAL, FREET4, T3FREE, THYROIDAB in the last 72 hours. Anemia Panel: No results for input(s): VITAMINB12, FOLATE, FERRITIN, TIBC, IRON, RETICCTPCT in the last 72 hours. Sepsis Labs: No results for input(s): PROCALCITON, LATICACIDVEN in the last 168 hours.  Recent Results (from the past 240 hour(s))  Resp Panel by RT-PCR (Flu A&B, Covid) Nasopharyngeal Swab     Status: None   Collection Time: 01/31/21 10:40 PM   Specimen: Nasopharyngeal Swab; Nasopharyngeal(NP) swabs in vial transport medium  Result Value Ref Range Status   SARS Coronavirus 2 by RT PCR NEGATIVE NEGATIVE Final    Comment: (NOTE) SARS-CoV-2 target nucleic acids are NOT DETECTED.  The SARS-CoV-2 RNA is generally detectable in upper respiratory specimens during the acute phase of infection. The lowest concentration of SARS-CoV-2 viral copies this assay can detect is 138 copies/mL. A negative result does not preclude SARS-Cov-2 infection and should not be used as the sole basis for treatment or other patient management decisions. A negative result may occur with  improper specimen collection/handling, submission of specimen other than nasopharyngeal swab, presence of viral mutation(s) within the areas targeted by this assay, and inadequate number of viral copies(<138 copies/mL). A negative result must be combined with clinical observations, patient history, and epidemiological information. The expected result is Negative.  Fact Sheet  for Patients:  BloggerCourse.com  Fact Sheet for Healthcare Providers:  SeriousBroker.it  This test is no t yet approved or cleared by the [place] FDA and  has been authorized for detection and/or diagnosis of SARS-CoV-2 by FDA under an Emergency Use Authorization (EUA). This EUA will remain  in effect (meaning this test can be used) for the  duration of the COVID-19 declaration under Section 564(b)(1) of the Act, 21 U.S.C.section 360bbb-3(b)(1), unless the authorization is terminated  or revoked sooner.       Influenza A by PCR NEGATIVE NEGATIVE Final   Influenza B by PCR NEGATIVE NEGATIVE Final    Comment: (NOTE) The Xpert Xpress SARS-CoV-2/FLU/RSV plus assay is intended as an aid in the diagnosis of influenza from Nasopharyngeal swab specimens and should not be used as a sole basis for treatment. Nasal washings and aspirates are unacceptable for Xpert Xpress SARS-CoV-2/FLU/RSV testing.  Fact Sheet for Patients: BloggerCourse.com  Fact Sheet for Healthcare Providers: SeriousBroker.it  This test is not yet approved or cleared by the [place] FDA and has been authorized for detection and/or diagnosis of SARS-CoV-2 by FDA under an Emergency Use Authorization (EUA). This EUA will remain in effect (meaning this test can be used) for the duration of the COVID-19 declaration under Section 564(b)(1) of the Act, 21 U.S.C. section 360bbb-3(b)(1), unless the authorization is terminated or revoked.  Performed at [facility], [address] [place], [place] [address]      Radiology Studies: IR TUNNELED CENTRAL VENOUS CATHETER PLACEMENT  Result Date: 02/01/2021 INDICATION: 40 year old with acute kidney injury. Catheter needed for hemodialysis. EXAM: FLUOROSCOPIC AND ULTRASOUND GUIDED PLACEMENT OF A TUNNELED DIALYSIS CATHETER Physician: Rachelle Hora. Lowella Dandy, MD  MEDICATIONS: Ancef 2 g; The antibiotic was administered within an appropriate time interval prior to skin puncture. ANESTHESIA/SEDATION: Versed 0.0 mg IV; Fentanyl 50 mcg IV; Moderate Sedation Time:  24 minutes The patient was continuously monitored during the procedure by the interventional radiology nurse under my direct supervision. FLUOROSCOPY TIME:  Fluoroscopy Time: 2 minutes, 24 seconds, 10 mGy COMPLICATIONS: None immediate. PROCEDURE: The procedure was explained to the patient. The risks and benefits of the procedure were discussed and the patient's questions were addressed. Informed consent was obtained from the patient. The patient was placed supine on the interventional table. Ultrasound confirmed a patent right internal jugular vein. Ultrasound images were obtained for documentation. The right neck and chest was prepped and draped in a sterile fashion. Maximal barrier sterile technique was utilized including caps, mask, sterile gowns, sterile gloves, sterile drape, hand hygiene and skin antiseptic. The right neck was anesthetized with 1% lidocaine. A small incision was made with #11 blade scalpel. A 21 gauge needle directed into the right internal jugular vein with ultrasound guidance. A micropuncture dilator set was placed. A 19 cm tip to cuff Palindrome catheter was selected. The skin below the right clavicle was anesthetized and a small incision was made with an #11 blade scalpel. A subcutaneous tunnel was formed to the vein dermatotomy site. The catheter was brought through the tunnel. Micropuncture catheter was removed over a superstiff Amplatz wire. The vein dermatotomy site was dilated to accommodate a peel-away sheath. The catheter was placed through the peel-away sheath and directed into the central venous structures. The tip of the catheter was placed at the superior cavoatrial junction with fluoroscopy. Fluoroscopic images were obtained for documentation. Both lumens were found to aspirate and  flush well. The proper amount of heparin was flushed in both lumens. The vein dermatotomy site was closed using a single layer of absorbable suture and Dermabond. The catheter was secured to the skin using Prolene suture. IMPRESSION: Successful placement of a right jugular tunneled dialysis catheter using ultrasound and fluoroscopic guidance. Electronically Signed   By: Richarda Overlie M.D.   On: 02/01/2021 16:33   Scheduled Meds: . Chlorhexidine Gluconate Cloth  6 each Topical Q0600  . Chlorhexidine Gluconate Cloth  6 each Topical Q0600   Continuous Infusions:    LOS: 3 days   Time spent:  Azucena Fallen, DO [facility]  If 7PM-7AM, please contact night-coverage [email or web address]  02/03/2021, 8:35 AM

## 2021-02-03 NOTE — Progress Notes (Signed)
   02/03/21 0002  Assess: MEWS Score  Temp 97.7 F (36.5 C)  BP 124/80  Pulse Rate (!) 36  ECG Heart Rate (!) 36  Resp 16  Level of Consciousness Alert  SpO2 98 %  O2 Device Room Air  Assess: MEWS Score  MEWS Temp 0  MEWS Systolic 0  MEWS Pulse 2  MEWS RR 0  MEWS LOC 0  MEWS Score 2  MEWS Score Color Yellow  Assess: if the MEWS score is Yellow or Red  Were vital signs taken at a resting state? Yes  Focused Assessment No change from prior assessment  Early Detection of Sepsis Score *See Row Information* Low  MEWS guidelines implemented *See Row Information* Yes  Treat  MEWS Interventions Administered prn meds/treatments  Take Vital Signs  Increase Vital Sign Frequency  Yellow: Q 2hr X 2 then Q 4hr X 2, if remains yellow, continue Q 4hrs  Escalate  MEWS: Escalate Yellow: discuss with charge nurse/RN and consider discussing with provider and RRT  Notify: Charge Nurse/RN  Name of Charge Nurse/RN Notified Shanell, RN  Date Charge Nurse/RN Notified 02/03/21  Time Charge Nurse/RN Notified 0030  Document  Patient Outcome Stabilized after interventions  Progress note created (see row info) Yes

## 2021-02-03 NOTE — Progress Notes (Signed)
Pt ambulated 600 ft on RA, rolling walker, standby assist, steady gait, tolerated well with no complaints. Pt to recliner chair after walk. Encouraged additional ambulation, OOB as tolerated.   Joylene Grapes, RN, BSN

## 2021-02-04 LAB — COMPREHENSIVE METABOLIC PANEL
ALT: 788 U/L — ABNORMAL HIGH (ref 0–44)
AST: 166 U/L — ABNORMAL HIGH (ref 15–41)
Albumin: 2.6 g/dL — ABNORMAL LOW (ref 3.5–5.0)
Alkaline Phosphatase: 72 U/L (ref 38–126)
Anion gap: 12 (ref 5–15)
BUN: 107 mg/dL — ABNORMAL HIGH (ref 6–20)
CO2: 27 mmol/L (ref 22–32)
Calcium: 7.5 mg/dL — ABNORMAL LOW (ref 8.9–10.3)
Chloride: 97 mmol/L — ABNORMAL LOW (ref 98–111)
Creatinine, Ser: 12.73 mg/dL — ABNORMAL HIGH (ref 0.61–1.24)
GFR, Estimated: 5 mL/min — ABNORMAL LOW (ref >60)
Glucose, Bld: 128 mg/dL — ABNORMAL HIGH (ref 70–99)
Potassium: 5.5 mmol/L — ABNORMAL HIGH (ref 3.5–5.1)
Sodium: 136 mmol/L (ref 135–145)
Total Bilirubin: 1.7 mg/dL — ABNORMAL HIGH (ref 0.3–1.2)
Total Protein: 4.8 g/dL — ABNORMAL LOW (ref 6.5–8.1)

## 2021-02-04 LAB — CBC
HCT: 30.9 % — ABNORMAL LOW (ref 39.0–52.0)
Hemoglobin: 10.6 g/dL — ABNORMAL LOW (ref 13.0–17.0)
MCH: 31.2 pg (ref 26.0–34.0)
MCHC: 34.3 g/dL (ref 30.0–36.0)
MCV: 90.9 fL (ref 80.0–100.0)
Platelets: 83 10*3/uL — ABNORMAL LOW (ref 150–400)
RBC: 3.4 MIL/uL — ABNORMAL LOW (ref 4.22–5.81)
RDW: 17.6 % — ABNORMAL HIGH (ref 11.5–15.5)
WBC: 5.4 10*3/uL (ref 4.0–10.5)
nRBC: 0 % (ref 0.0–0.2)

## 2021-02-04 LAB — CK: Total CK: 163 U/L (ref 49–397)

## 2021-02-04 MED ORDER — SODIUM ZIRCONIUM CYCLOSILICATE 10 G PO PACK
10.0000 g | PACK | Freq: Three times a day (TID) | ORAL | Status: AC
  Administered 2021-02-04 (×3): 10 g via ORAL
  Filled 2021-02-04 (×4): qty 1

## 2021-02-04 NOTE — Progress Notes (Signed)
PROGRESS NOTE   Evan Vargas  LGX:[id number] DOB: [birth year] DOA: 01/31/2021 PCP: Pcp, No   Brief Narrative:  Evan Vargas is a 40 y.o. male with medical history significant for paroxysmal atrial fibrillation not on chronic anticoagulation, chronic diastolic heart failure, who is admitted to [facility] on 01/31/2021 with acute renal failure after presenting from home to [facility] ED complaining of profound nausea vomiting and diarrhea without any signs or symptoms of bleeding.  Patient is a known bodybuilder, recently attempting to dehydrate in preparation for an event, cutting fluids while taking protein supplements.  Nephrology consulted recommending transfer to [facility] for further evaluation and likely dialysis given profoundly elevated creatinine and potassium.  Patient tolerating dialysis quite well thus far, labs continue to improve drastically but not yet back to baseline.  Unfortunately patient's creatinine and BUN are somewhat stable and do not appear to be wholly improved by dialysis.  Unclear at this time whether or not patient will be able to come off dialysis in the interim or will need to be scheduled for dialysis in hopes that his renal function returns in the near future.  Assessment & Plan:   Principal Problem:   Acute renal failure (ARF) (HCC) Active Problems:   Hyperkalemia   High anion gap metabolic acidosis   Nausea & vomiting   Diarrhea   Transaminitis   Hyperbilirubinemia   Paroxysmal atrial fibrillation (HCC)  Acute profound kidney injury/renal failure, minimally improving Likely provoked by diet, lifestyle and dehydration, POA - Nephrology following, appreciate insight and recommendations - Temporary dialysis catheter placement 02/01/2021 - Repeat dialysis per nephrology - may hold off today given mild improvement in patient's labs and urine output - High-protein diets have been shown to be somewhat detrimental to chronic kidney disease patients, again  unclear what kind of supplements the patient is on which could contribute to kidney injury as well -Urine output continues to be minimal, BUN and creatinine remain markedly elevated  Profound hyperkalemia, symptomatic, POA, stabilizing -Improving with dialysis, holding any further temporizing measures at this time Lab Results  Component Value Date   K 5.5 (H) 02/04/2021   Concurrent intractable nausea vomiting diarrhea likely in the setting of above, resolved -Resolved with temporizing measures of hyperkalemia and supportive care -Increase p.o. intake  Acutely elevated LFTs, resolving Thrombocytopenia, minimally improving - Likely secondary to above - Labs downtrending appropriately, continue to follow - hold off on GI consult - CT abdomen shows mild ascites with gallbladder distention and pericholecystic fluid CMP Latest Ref Rng & Units 02/04/2021 02/03/2021 02/02/2021  Glucose 70 - 99 mg/dL 814(G) 93 818(H)  BUN 6 - 20 mg/dL 631(S) 970(Y) 637(C)  Creatinine 0.61 - 1.24 mg/dL 58.85(O) 27.74(J) 28.78(M)  Sodium 135 - 145 mmol/L 136 136 136  Potassium 3.5 - 5.1 mmol/L 5.5(H) 5.8(H) 5.0  Chloride 98 - 111 mmol/L 97(L) 98 96(L)  CO2 22 - 32 mmol/L 27 23 21(L)  Calcium 8.9 - 10.3 mg/dL 7.5(L) 7.3(L) 7.0(L)  Total Protein 6.5 - 8.1 g/dL 4.8(L) 4.8(L) 4.7(L)  Total Bilirubin 0.3 - 1.2 mg/dL 7.6(H) 2.4(H) 4.4(H)  Alkaline Phos 38 - 126 U/L 72 78 84  AST 15 - 41 U/L 166(H) 254(H) 448(H)  ALT 0 - 44 U/L 788(H) 1,158(H) 1,963(H)   Anion gap metabolic acidosis in the setting of above and uremia, resolving -In the setting of uremia -continues to improve with dialysis  Elevated CPK/rhabdomyolysis -Continue IV fluids, likely in the setting of exercise, profound dehydration, renal failure  Paroxysmal atrial  fibrillation, known history, without RVR, likely provoked and resolved Asymptomatic sinus bradycardia: -CHA2DS2-VASc of 1, no indication for anticoagulation -Continue to follow  clinically, on telemetry given above -Asymptomatic bradycardic in the setting of increased cardiac reserves given lifestyle and workout regimen (typically seen in high endurance athletes)  Chronic diastolic heart failure -Continue to follow clinically, mild ascites on incoming imaging, appears euvolemic on exam today -Volume control with dialysis as above  DVT prophylaxis:  SCDs only in the setting of acute liver failure, thrombocytopenia Code Status: Full code Family Communication:  Mother and sister at bedside Status is: Inpatient  Dispo: The patient is from: Home              Anticipated d/c is to: To be determined              Anticipated d/c date is: 48-72h              Patient currently not medically stable for discharge  Consultants:   Nephrology  Procedures:   Dialysis catheter placement and dialysis initiation 02/01/2021  Antimicrobials:  None indicated  Subjective: No acute issues or events overnight, patient indicates he feels markedly improved from admission moderate urine improvement, states his urine is more clear with higher output over the past 24 hours.  Otherwise ambulating without difficulty requesting discharge home which we discussed would likely be another few days given his still profoundly elevated creatinine and ongoing hyperkalemia.  Objective: Vitals:   02/03/21 1530 02/03/21 1559 02/04/21 0009 02/04/21 0613  BP: 134/88 134/88 (!) 150/70 132/62  Pulse: (!) 42 (!) 41 (!) 40 77  Resp:  18 16 18   Temp:  98 F (36.7 C) 98 F (36.7 C) 98 F (36.7 C)  TempSrc:  Oral Oral Oral  SpO2:  99% 99% 99%  Weight:  89.1 kg  88.7 kg  Height:        Intake/Output Summary (Last 24 hours) at 02/04/2021 0804 Last data filed at 02/03/2021 1559 Gross per 24 hour  Intake --  Output 0 ml  Net 0 ml   Filed Weights   02/03/21 1246 02/03/21 1559 02/04/21 0613  Weight: 89 kg 89.1 kg 88.7 kg    Examination:  General exam: Appears calm and comfortable   Respiratory system: Clear to auscultation. Respiratory effort normal. Cardiovascular system: S1 & S2 heard, bradycardic rate, no JVD, murmurs, rubs, gallops or clicks. No pedal edema. Gastrointestinal system: Abdomen is nondistended, soft and nontender. No organomegaly or masses felt. Normal bowel sounds heard. Central nervous system: Alert and oriented. No focal neurological deficits. Extremities: Symmetric 5 x 5 power. Skin: No rashes, lesions or ulcers Psychiatry: Judgement and insight appear normal. Mood & affect appropriate.     Data Reviewed: I have personally reviewed following labs and imaging studies  CBC: Recent Labs  Lab 01/31/21 1647 02/01/21 0210 02/02/21 0234 02/03/21 0308 02/04/21 0118  WBC 6.7 5.2 4.8 4.7 5.4  HGB 13.8 11.5* 10.5* 11.5* 10.6*  HCT 39.8 33.4* 28.4* 32.4* 30.9*  MCV 90.5 90.5 85.5 88.3 90.9  PLT 134* 89* 75* 72* 83*   Basic Metabolic Panel: Recent Labs  Lab 01/31/21 1912 01/31/21 1928 02/01/21 0210 02/01/21 0623 02/01/21 2254 02/02/21 0234 02/03/21 0308 02/04/21 0118  NA  --    < > 138  138  138 138 136 136 136 136  K  --    < > 6.3*  6.3*  6.2* 6.0* 4.9 5.0 5.8* 5.5*  CL  --    < >  103  102  102 100 99 96* 98 97*  CO2  --    < > 16*  16*  18* 16* 25 21* 23 27  GLUCOSE  --    < > 112*  113*  114* 125* 134* 121* 93 128*  BUN  --    < > 166*  170*  170* 173* 145* 148* 147* 107*  CREATININE  --    < > 14.06*  13.86*  14.18* 14.67* 13.97* 14.68* 15.13* 12.73*  CALCIUM  --    < > 7.2*  7.2*  7.1* 7.2* 7.0* 7.0* 7.3* 7.5*  MG 3.0*  --  2.6*  --   --   --   --   --   PHOS  --   --  9.9*  9.8*  --  8.3*  --   --   --    < > = values in this interval not displayed.   GFR: Estimated Creatinine Clearance: 8.5 mL/min (A) (by C-G formula based on SCr of 12.73 mg/dL (H)). Liver Function Tests: Recent Labs  Lab 01/31/21 1928 02/01/21 0210 02/01/21 2254 02/02/21 0234 02/03/21 0308 02/04/21 0118  AST 1,188* 847*  --  448*  254* 166*  ALT 4,146* 3,224*  --  1,963* 1,158* 788*  ALKPHOS 121 100  --  84 78 72  BILITOT 9.1* 8.6*  --  4.4* 2.4* 1.7*  PROT 6.0* 5.2*  --  4.7* 4.8* 4.8*  ALBUMIN 3.8 3.1* 2.8* 2.7* 2.6* 2.6*   Recent Labs  Lab 01/31/21 1647  LIPASE 30   Recent Labs  Lab 01/31/21 1928  AMMONIA 32   Coagulation Profile: Recent Labs  Lab 01/31/21 1912 02/01/21 0210  INR 1.1 1.1   Cardiac Enzymes: Recent Labs  Lab 01/31/21 1912 02/01/21 0210 02/02/21 0234 02/03/21 0308 02/04/21 0118  CKTOTAL 571* 450* 291 220 163   BNP (last 3 results) No results for input(s): PROBNP in the last 8760 hours. HbA1C: No results for input(s): HGBA1C in the last 72 hours. CBG: No results for input(s): GLUCAP in the last 168 hours. Lipid Profile: No results for input(s): CHOL, HDL, LDLCALC, TRIG, CHOLHDL, LDLDIRECT in the last 72 hours. Thyroid Function Tests: No results for input(s): TSH, T4TOTAL, FREET4, T3FREE, THYROIDAB in the last 72 hours. Anemia Panel: No results for input(s): VITAMINB12, FOLATE, FERRITIN, TIBC, IRON, RETICCTPCT in the last 72 hours. Sepsis Labs: No results for input(s): PROCALCITON, LATICACIDVEN in the last 168 hours.  Recent Results (from the past 240 hour(s))  Resp Panel by RT-PCR (Flu A&B, Covid) Nasopharyngeal Swab     Status: None   Collection Time: 01/31/21 10:40 PM   Specimen: Nasopharyngeal Swab; Nasopharyngeal(NP) swabs in vial transport medium  Result Value Ref Range Status   SARS Coronavirus 2 by RT PCR NEGATIVE NEGATIVE Final    Comment: (NOTE) SARS-CoV-2 target nucleic acids are NOT DETECTED.  The SARS-CoV-2 RNA is generally detectable in upper respiratory specimens during the acute phase of infection. The lowest concentration of SARS-CoV-2 viral copies this assay can detect is 138 copies/mL. A negative result does not preclude SARS-Cov-2 infection and should not be used as the sole basis for treatment or other patient management decisions. A negative  result may occur with  improper specimen collection/handling, submission of specimen other than nasopharyngeal swab, presence of viral mutation(s) within the areas targeted by this assay, and inadequate number of viral copies(<138 copies/mL). A negative result must be combined with clinical observations, patient history, and epidemiological information.  The expected result is Negative.  Fact Sheet for Patients:  BloggerCourse.com  Fact Sheet for Healthcare Providers:  SeriousBroker.it  This test is no t yet approved or cleared by the [place] FDA and  has been authorized for detection and/or diagnosis of SARS-CoV-2 by FDA under an Emergency Use Authorization (EUA). This EUA will remain  in effect (meaning this test can be used) for the duration of the COVID-19 declaration under Section 564(b)(1) of the Act, 21 U.S.C.section 360bbb-3(b)(1), unless the authorization is terminated  or revoked sooner.       Influenza A by PCR NEGATIVE NEGATIVE Final   Influenza B by PCR NEGATIVE NEGATIVE Final    Comment: (NOTE) The Xpert Xpress SARS-CoV-2/FLU/RSV plus assay is intended as an aid in the diagnosis of influenza from Nasopharyngeal swab specimens and should not be used as a sole basis for treatment. Nasal washings and aspirates are unacceptable for Xpert Xpress SARS-CoV-2/FLU/RSV testing.  Fact Sheet for Patients: BloggerCourse.com  Fact Sheet for Healthcare Providers: SeriousBroker.it  This test is not yet approved or cleared by the [place] FDA and has been authorized for detection and/or diagnosis of SARS-CoV-2 by FDA under an Emergency Use Authorization (EUA). This EUA will remain in effect (meaning this test can be used) for the duration of the COVID-19 declaration under Section 564(b)(1) of the Act, 21 U.S.C. section 360bbb-3(b)(1), unless the authorization is  terminated or revoked.  Performed at [facility], [address] [place], [place] [address]      Radiology Studies: No results found. Scheduled Meds: . Chlorhexidine Gluconate Cloth  6 each Topical Q0600  . Chlorhexidine Gluconate Cloth  6 each Topical Q0600  . Chlorhexidine Gluconate Cloth  6 each Topical Q0600   Continuous Infusions:    LOS: 4 days   Time spent:  Azucena Fallen, DO [facility]  If 7PM-7AM, please contact night-coverage [email or web address]  02/04/2021, 8:04 AM

## 2021-02-04 NOTE — Progress Notes (Addendum)
[facility] ROUNDING NOTE   Subjective:   Interval History: Is an otherwise healthy 40 year old gentleman with acute uremic symptoms over the past 4 days including nausea vomiting and diarrhea.  He also complains of some abdominal pain and discomfort.  He actively engages in bodybuilding and has been using creatine supplements.  There is no use nonsteroidal anti-inflammatory drugs he is admitted to decreasing urine output.  His blood pressure is not elevated.  He is not anemic.  His labs showed severe metabolic disarray with a glucose of 111 BUN 163 creatinine 13.7 calcium 7.4 CO2 14 calcium 7.3.  Of note his previous creatinine was 1.3 mg/dL on 6/50/3546.     Patient is undergoing dialysis treatment 02/01/2021 and 02/02/2021.  We will continue dialysis 02/04/2019.  There was some microscopic hematuria and serologies have been sent.  MPO is negative ANA negative.  Hepatitis and HIV are negative.  There is no evidence of any drug use.   CK levels were not elevated to any extent.  We will send serum protein electrophoresis as well as light chain ratios.  Blood pressure 132/62 pulse 77 temperature 98 O2 sats 99% room air  Sodium 136 potassium 5.5 chloride 97 CO2 27 BUN 107 creatinine 12.73 glucose 128 albumin 2.6 hemoglobin 10.6   Home medications: Eliquis 5 mg twice daily, metoprolol 25 mg daily, creatinine powder 1 scoop daily, glutamine powder 1 scoop daily, multivitamins 1 daily  No recorded urine output     Objective:  Vital signs in last 24 hours:  Temp:  [98 F (36.7 C)-98.3 F (36.8 C)] 98 F (36.7 C) (04/24 5681) Pulse Rate:  [38-77] 77 (04/24 0613) Resp:  [16-18] 18 (04/24 0613) BP: (121-150)/(62-88) 132/62 (04/24 0613) SpO2:  [96 %-99 %] 99 % (04/24 0613) Weight:  [88.7 kg-89.1 kg] 88.7 kg (04/24 0613)  Weight change: 1.4 kg Filed Weights   02/03/21 1246 02/03/21 1559 02/04/21 0613  Weight: 89 kg 89.1 kg 88.7 kg    Intake/Output: No intake/output data  recorded.   Intake/Output this shift:  No intake/output data recorded.  Alert pleasant gentleman nondistressed CVS- RRR no murmurs rubs gallops RS- CTA no wheezes or rales ABD- BS present soft non-distended EXT- no edema   Basic Metabolic Panel: Recent Labs  Lab 01/31/21 1912 01/31/21 1928 02/01/21 0210 02/01/21 0623 02/01/21 2254 02/02/21 0234 02/03/21 0308 02/04/21 0118  NA  --    < > 138  138  138 138 136 136 136 136  K  --    < > 6.3*  6.3*  6.2* 6.0* 4.9 5.0 5.8* 5.5*  CL  --    < > 103  102  102 100 99 96* 98 97*  CO2  --    < > 16*  16*  18* 16* 25 21* 23 27  GLUCOSE  --    < > 112*  113*  114* 125* 134* 121* 93 128*  BUN  --    < > 166*  170*  170* 173* 145* 148* 147* 107*  CREATININE  --    < > 14.06*  13.86*  14.18* 14.67* 13.97* 14.68* 15.13* 12.73*  CALCIUM  --    < > 7.2*  7.2*  7.1* 7.2* 7.0* 7.0* 7.3* 7.5*  MG 3.0*  --  2.6*  --   --   --   --   --   PHOS  --   --  9.9*  9.8*  --  8.3*  --   --   --    < > =  values in this interval not displayed.    Liver Function Tests: Recent Labs  Lab 01/31/21 1928 02/01/21 0210 02/01/21 2254 02/02/21 0234 02/03/21 0308 02/04/21 0118  AST 1,188* 847*  --  448* 254* 166*  ALT 4,146* 3,224*  --  1,963* 1,158* 788*  ALKPHOS 121 100  --  84 78 72  BILITOT 9.1* 8.6*  --  4.4* 2.4* 1.7*  PROT 6.0* 5.2*  --  4.7* 4.8* 4.8*  ALBUMIN 3.8 3.1* 2.8* 2.7* 2.6* 2.6*   Recent Labs  Lab 01/31/21 1647  LIPASE 30   Recent Labs  Lab 01/31/21 1928  AMMONIA 32    CBC: Recent Labs  Lab 01/31/21 1647 02/01/21 0210 02/02/21 0234 02/03/21 0308 02/04/21 0118  WBC 6.7 5.2 4.8 4.7 5.4  HGB 13.8 11.5* 10.5* 11.5* 10.6*  HCT 39.8 33.4* 28.4* 32.4* 30.9*  MCV 90.5 90.5 85.5 88.3 90.9  PLT 134* 89* 75* 72* 83*    Cardiac Enzymes: Recent Labs  Lab 01/31/21 1912 02/01/21 0210 02/02/21 0234 02/03/21 0308 02/04/21 0118  CKTOTAL 571* 450* 291 220 163    BNP: Invalid input(s): POCBNP  CBG: No  results for input(s): GLUCAP in the last 168 hours.  Microbiology: Results for orders placed or performed during the hospital encounter of 01/31/21  Resp Panel by RT-PCR (Flu A&B, Covid) Nasopharyngeal Swab     Status: None   Collection Time: 01/31/21 10:40 PM   Specimen: Nasopharyngeal Swab; Nasopharyngeal(NP) swabs in vial transport medium  Result Value Ref Range Status   SARS Coronavirus 2 by RT PCR NEGATIVE NEGATIVE Final    Comment: (NOTE) SARS-CoV-2 target nucleic acids are NOT DETECTED.  The SARS-CoV-2 RNA is generally detectable in upper respiratory specimens during the acute phase of infection. The lowest concentration of SARS-CoV-2 viral copies this assay can detect is 138 copies/mL. A negative result does not preclude SARS-Cov-2 infection and should not be used as the sole basis for treatment or other patient management decisions. A negative result may occur with  improper specimen collection/handling, submission of specimen other than nasopharyngeal swab, presence of viral mutation(s) within the areas targeted by this assay, and inadequate number of viral copies(<138 copies/mL). A negative result must be combined with clinical observations, patient history, and epidemiological information. The expected result is Negative.  Fact Sheet for Patients:  BloggerCourse.com  Fact Sheet for Healthcare Providers:  SeriousBroker.it  This test is no t yet approved or cleared by the [place] FDA and  has been authorized for detection and/or diagnosis of SARS-CoV-2 by FDA under an Emergency Use Authorization (EUA). This EUA will remain  in effect (meaning this test can be used) for the duration of the COVID-19 declaration under Section 564(b)(1) of the Act, 21 U.S.C.section 360bbb-3(b)(1), unless the authorization is terminated  or revoked sooner.       Influenza A by PCR NEGATIVE NEGATIVE Final   Influenza B by PCR  NEGATIVE NEGATIVE Final    Comment: (NOTE) The Xpert Xpress SARS-CoV-2/FLU/RSV plus assay is intended as an aid in the diagnosis of influenza from Nasopharyngeal swab specimens and should not be used as a sole basis for treatment. Nasal washings and aspirates are unacceptable for Xpert Xpress SARS-CoV-2/FLU/RSV testing.  Fact Sheet for Patients: BloggerCourse.com  Fact Sheet for Healthcare Providers: SeriousBroker.it  This test is not yet approved or cleared by the [place] FDA and has been authorized for detection and/or diagnosis of SARS-CoV-2 by FDA under an Emergency Use Authorization (EUA). This EUA will remain in  effect (meaning this test can be used) for the duration of the COVID-19 declaration under Section 564(b)(1) of the Act, 21 U.S.C. section 360bbb-3(b)(1), unless the authorization is terminated or revoked.  Performed at [facility], [address], [place], [place] [address]     Coagulation Studies: No results for input(s): LABPROT, INR in the last 72 hours.  Urinalysis: No results for input(s): COLORURINE, LABSPEC, PHURINE, GLUCOSEU, HGBUR, BILIRUBINUR, KETONESUR, PROTEINUR, UROBILINOGEN, NITRITE, LEUKOCYTESUR in the last 72 hours.  Invalid input(s): APPERANCEUR    Imaging: No results found.   Medications:    . Chlorhexidine Gluconate Cloth  6 each Topical Q0600  . Chlorhexidine Gluconate Cloth  6 each Topical Q0600  . Chlorhexidine Gluconate Cloth  6 each Topical Q0600   acetaminophen **OR** acetaminophen, HYDROmorphone (DILAUDID) injection, LORazepam  Assessment/ Plan:    1.  Acute kidney injury.  Recent pre-renal insults of n/v/d - nonbloody diarrhea.  UA with 100 mg/dL protein, 6-04 RBC.  CK only mildly elevated.  Platelets mildly reduced at 134 so TTP/HUS felt unlikely.  Hepatitis panel nonreactive.  No evidence of any obstruction.  Mild ascites noted.  Baseline creatinine  appears to be in normal range.  Serologies have been sent.  Dialysis #1 02/01/2021 dialysis #2 02/02/2021 dialysis #3  02/03/21 still oliguric anuric.  We will continue to follow.  The does not appear to be any recovery of renal function.  Serologies have been negative so far.  We will send serum protein electrophoresis.  As well as immunofixation of the urine and light chain ratios 3 Bradycardia athletic may have a resting bradycardia 4. Metabolic acidosis  improved with dialysis 5.Nausea/vomiting/diarrhea Supportive measures per primary team and GI is consulted.  I believe this probably secondary to uremia 6. Gallbladder distention.  Appears to have some transaminitis we will continue to follow.  Hepatitis studies appear to be negative at this point. - GI consulted  7.Hypertrophic cardiomyopathy -continue IV hydration 8.  Hyperkalemia mild we will treat with Lokelma x24 hours     LOS: 4 Garnetta Buddy @TODAY @9 :32 AM

## 2021-02-05 ENCOUNTER — Inpatient Hospital Stay (HOSPITAL_COMMUNITY): Payer: Self-pay

## 2021-02-05 LAB — CBC WITH DIFFERENTIAL/PLATELET
Abs Immature Granulocytes: 0.04 10*3/uL (ref 0.00–0.07)
Abs Immature Granulocytes: 0.03 10*3/uL (ref 0.00–0.07)
Basophils Absolute: 0 10*3/uL (ref 0.0–0.1)
Basophils Absolute: 0 10*3/uL (ref 0.0–0.1)
Basophils Relative: 0 %
Basophils Relative: 0 %
Eosinophils Absolute: 0.1 10*3/uL (ref 0.0–0.5)
Eosinophils Absolute: 0.1 10*3/uL (ref 0.0–0.5)
Eosinophils Relative: 1 %
Eosinophils Relative: 1 %
HCT: 29.3 % — ABNORMAL LOW (ref 39.0–52.0)
HCT: 29.2 % — ABNORMAL LOW (ref 39.0–52.0)
Hemoglobin: 9.8 g/dL — ABNORMAL LOW (ref 13.0–17.0)
Hemoglobin: 9.8 g/dL — ABNORMAL LOW (ref 13.0–17.0)
Immature Granulocytes: 1 %
Immature Granulocytes: 1 %
Lymphocytes Relative: 17 %
Lymphocytes Relative: 16 %
Lymphs Abs: 0.8 10*3/uL (ref 0.7–4.0)
Lymphs Abs: 0.7 10*3/uL (ref 0.7–4.0)
MCH: 31.4 pg (ref 26.0–34.0)
MCH: 31.5 pg (ref 26.0–34.0)
MCHC: 33.4 g/dL (ref 30.0–36.0)
MCHC: 33.6 g/dL (ref 30.0–36.0)
MCV: 93.9 fL (ref 80.0–100.0)
MCV: 93.9 fL (ref 80.0–100.0)
Monocytes Absolute: 0.9 10*3/uL (ref 0.1–1.0)
Monocytes Absolute: 0.9 10*3/uL (ref 0.1–1.0)
Monocytes Relative: 19 %
Monocytes Relative: 19 %
Neutro Abs: 2.9 10*3/uL (ref 1.7–7.7)
Neutro Abs: 2.9 10*3/uL (ref 1.7–7.7)
Neutrophils Relative %: 62 %
Neutrophils Relative %: 63 %
Platelets: 101 10*3/uL — ABNORMAL LOW (ref 150–400)
Platelets: 117 10*3/uL — ABNORMAL LOW (ref 150–400)
RBC: 3.12 MIL/uL — ABNORMAL LOW (ref 4.22–5.81)
RBC: 3.11 MIL/uL — ABNORMAL LOW (ref 4.22–5.81)
RDW: 17.5 % — ABNORMAL HIGH (ref 11.5–15.5)
RDW: 17.2 % — ABNORMAL HIGH (ref 11.5–15.5)
WBC: 4.8 10*3/uL (ref 4.0–10.5)
WBC: 4.6 10*3/uL (ref 4.0–10.5)
nRBC: 0 % (ref 0.0–0.2)
nRBC: 0 % (ref 0.0–0.2)

## 2021-02-05 LAB — COMPREHENSIVE METABOLIC PANEL
ALT: 460 U/L — ABNORMAL HIGH (ref 0–44)
AST: 120 U/L — ABNORMAL HIGH (ref 15–41)
Albumin: 2.6 g/dL — ABNORMAL LOW (ref 3.5–5.0)
Alkaline Phosphatase: 69 U/L (ref 38–126)
Anion gap: 16 — ABNORMAL HIGH (ref 5–15)
BUN: 130 mg/dL — ABNORMAL HIGH (ref 6–20)
CO2: 22 mmol/L (ref 22–32)
Calcium: 7.4 mg/dL — ABNORMAL LOW (ref 8.9–10.3)
Chloride: 96 mmol/L — ABNORMAL LOW (ref 98–111)
Creatinine, Ser: 14.65 mg/dL — ABNORMAL HIGH (ref 0.61–1.24)
GFR, Estimated: 4 mL/min — ABNORMAL LOW (ref >60)
Glucose, Bld: 128 mg/dL — ABNORMAL HIGH (ref 70–99)
Potassium: 4.9 mmol/L (ref 3.5–5.1)
Sodium: 134 mmol/L — ABNORMAL LOW (ref 135–145)
Total Bilirubin: 1.7 mg/dL — ABNORMAL HIGH (ref 0.3–1.2)
Total Protein: 4.8 g/dL — ABNORMAL LOW (ref 6.5–8.1)

## 2021-02-05 LAB — CBC
HCT: 29.1 % — ABNORMAL LOW (ref 39.0–52.0)
Hemoglobin: 9.8 g/dL — ABNORMAL LOW (ref 13.0–17.0)
MCH: 31.6 pg (ref 26.0–34.0)
MCHC: 33.7 g/dL (ref 30.0–36.0)
MCV: 93.9 fL (ref 80.0–100.0)
Platelets: 94 10*3/uL — ABNORMAL LOW (ref 150–400)
RBC: 3.1 MIL/uL — ABNORMAL LOW (ref 4.22–5.81)
RDW: 17.2 % — ABNORMAL HIGH (ref 11.5–15.5)
WBC: 4.7 10*3/uL (ref 4.0–10.5)
nRBC: 0 % (ref 0.0–0.2)

## 2021-02-05 LAB — URINALYSIS, ROUTINE W REFLEX MICROSCOPIC
Bilirubin Urine: NEGATIVE
Glucose, UA: NEGATIVE mg/dL
Ketones, ur: NEGATIVE mg/dL
Nitrite: NEGATIVE
Protein, ur: 100 mg/dL — AB
RBC / HPF: >50 RBC/hpf — ABNORMAL HIGH (ref 0–5)
Specific Gravity, Urine: 1.009 (ref 1.005–1.030)
WBC, UA: >50 WBC/hpf — ABNORMAL HIGH (ref 0–5)
pH: 6 (ref 5.0–8.0)

## 2021-02-05 LAB — KAPPA/LAMBDA LIGHT CHAINS
Kappa free light chain: 34.5 mg/L — ABNORMAL HIGH (ref 3.3–19.4)
Kappa, lambda light chain ratio: 1.34 (ref 0.26–1.65)
Lambda free light chains: 25.8 mg/L (ref 5.7–26.3)

## 2021-02-05 LAB — RETICULOCYTES
Immature Retic Fract: 2.2 % — ABNORMAL LOW (ref 2.3–15.9)
RBC.: 3.09 MIL/uL — ABNORMAL LOW (ref 4.22–5.81)
Retic Count, Absolute: 24.7 10*3/uL (ref 19.0–186.0)
Retic Ct Pct: 0.8 % (ref 0.4–3.1)

## 2021-02-05 LAB — IRON AND TIBC
Iron: 46 ug/dL (ref 45–182)
Saturation Ratios: 21 % (ref 17.9–39.5)
TIBC: 221 ug/dL — ABNORMAL LOW (ref 250–450)
UIBC: 175 ug/dL

## 2021-02-05 LAB — FOLATE: Folate: 20.4 ng/mL (ref >5.9)

## 2021-02-05 LAB — VITAMIN B12: Vitamin B-12: 7335 pg/mL — ABNORMAL HIGH (ref 180–914)

## 2021-02-05 LAB — LACTATE DEHYDROGENASE: LDH: 321 U/L — ABNORMAL HIGH (ref 98–192)

## 2021-02-05 LAB — FERRITIN: Ferritin: 685 ng/mL — ABNORMAL HIGH (ref 24–336)

## 2021-02-05 LAB — PROTEIN / CREATININE RATIO, URINE
Creatinine, Urine: 82.98 mg/dL
Protein Creatinine Ratio: 1.71 mg/mg{Cre} — ABNORMAL HIGH (ref 0.00–0.15)
Total Protein, Urine: 142 mg/dL

## 2021-02-05 LAB — CK: Total CK: 147 U/L (ref 49–397)

## 2021-02-05 MED ORDER — CALCIUM ACETATE (PHOS BINDER) 667 MG PO CAPS
1334.0000 mg | ORAL_CAPSULE | Freq: Three times a day (TID) | ORAL | Status: DC
  Administered 2021-02-05 – 2021-02-11 (×14): 1334 mg via ORAL
  Filled 2021-02-05 (×14): qty 2

## 2021-02-05 MED ORDER — CHLORHEXIDINE GLUCONATE CLOTH 2 % EX PADS
6.0000 | MEDICATED_PAD | Freq: Every day | CUTANEOUS | Status: DC
  Administered 2021-02-06 – 2021-02-07 (×2): 6 via TOPICAL

## 2021-02-05 NOTE — Progress Notes (Addendum)
PROGRESS NOTE    Evan Vargas  ZHY:[id number] DOB: February 01, 1981 DOA: 01/31/2021 PCP: Pcp, No   No chief complaint on file.   Brief Narrative:  Evan Vargas Evan Vargas 40 y.o.malewith medical history significant forparoxysmal atrial fibrillation not on chronic anticoagulation, chronic diastolic heart failure,[facility] acute [facility] failureafter presenting from home [facility] complaining of profound nausea vomiting and diarrhea without any signs or symptoms of bleeding.  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  Nephrology consulted recommending transfer to [facility] for further evaluation and likely dialysis given profoundly elevated creatinine and potassium.  Patient tolerating dialysis quite well thus far, labs continue to improve drastically but not yet back to baseline.  Unfortunately patient's creatinine and BUN are somewhat stable and do not appear to be wholly improved by dialysis.  Unclear at this time whether or not patient will be able to come off dialysis in the interim or will need to be scheduled for dialysis in hopes that his renal function returns in the near future.  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  He was taking expel (diuretic) prior to the competition.  Also takes creatine, protein, glutamine, salt substitute.  No significant NSAID exposure based on hx.  Etiology unclear, appreciate renal assistance.  ? ATN related to  dehydration in preparation for body building competition, compounded by eveloped N/V/D.  TTP/HUS thought unlikely given mildly reduced platelets on presentation, though he did have possible foodborne illness which led to N/V/D after eating burger (cooked medium). - baseline creatinine in 2019 was 1.3 - presented with creatinine of 13.87, BUN 171  - Temporary dialysis catheter placement 02/01/2021 - dialysis per renal - negative ANA, MPO/PR3, C3/C4.  Pending immunofixation, SPEP, light chains.  CK has been wnl (only mildly elevated at presentation). - UA with proteinuria/hematuria  - follow smear - consider GI pathogen panel if diarrhea recurs - UOP minimal, oligoanuric (275 cc yesterday recorded)  Hyperkalemia -2/2 above, he was taking salt substitute as well   Nausea  Vomiting  Diarrhea - ? foodborne illness  - improving - low threshold for GI path panel   Acutely elevated LFTs  Elevated Bilirubin - most obvious explanation would be shock liver in setting of hypovolemia with dehydration due to above - negative acute hepatitis panel.  Negative HIV.  No significant tylenol use.  He does take many supplements (creatine, glutamine, protein, expel) -> not sure if these could have contributed.   - follow RUQ [place] - currently improving -> he probably would benefit from GI follow up outpatient given ALT/AST 5044/1427 and bilirubin 9.7 at presentation -> of note, his LFT's were not normal in 2019 (possible insult on top of underlying liver disease?)  Thrombocytopenia, minimally improving - unclear reason, follow smear  Anion gap metabolic acidosis in the setting of above and uremia, resolving - 2/2 AKI, follow with dialysis, renal  Elevated CPK/rhabdomyolysis -only mildly elevated, don't think this was etiology for his AKI -resolved  Paroxysmal atrial fibrillation, known history, without RVR, likely provoked and  resolved Asymptomatic sinus bradycardia: -CHA2DS2-VASc of 1, no  indication for anticoagulation - appears plan at 2019 visit was to d/c anticoagulation after 4 weeks.   -sinus brady on tele -Continue to follow clinically, on telemetry given above -Asymptomatic bradycardic in the setting of increased cardiac reserves given lifestyle and workout regimen (typically seen in high endurance athletes)  Chronic diastolic heart failure -Continue to follow clinically, mild ascites on incoming imaging, appears euvolemic on exam today -Volume control with dialysis as above  DVT prophylaxis: SCD Code Status: full  Family Communication: none at bedside Disposition:   Status is: Inpatient  Remains inpatient appropriate because:Inpatient level of care appropriate due to severity of illness   Dispo: The patient is from: Home              Anticipated d/c is to: Home              Patient currently is not medically stable to d/c.   Difficult to place patient No   Consultants:   renal  Procedures:  Dialysis catheter 4/21  Antimicrobials:  Anti-infectives (From admission, onward)   Start     Dose/Rate Route Frequency Ordered Stop   02/01/21 1505  ceFAZolin (ANCEF) 2-4 GM/100ML-% IVPB       Note to Pharmacy: Cristy Friedlander   : cabinet override      02/01/21 1505 02/01/21 1516      Subjective: No new complaints, feels Evan Vargas little more energy today  Objective: Vitals:   02/04/21 0009 02/04/21 0613 02/04/21 1958 02/05/21 0810  BP: (!) 150/70 132/62 140/82 128/78  Pulse: (!) 40 77 61 (!) 42  Resp: 16 18 16 20   Temp: 98 F (36.7 C) 98 F (36.7 C) 98.8 F (37.1 C) 98.1 F (36.7 C)  TempSrc: Oral Oral Oral Oral  SpO2: 99% 99% 99% 98%  Weight:  88.7 kg    Height:        Intake/Output Summary (Last 24 hours) at 02/05/2021 1030 Last data filed at 02/05/2021 0640 Gross per 24 hour  Intake 428 ml  Output 275 ml  Net 153 ml   Filed Weights   02/03/21 1246 02/03/21 1559 02/04/21 0613  Weight: 89 kg 89.1 kg 88.7 kg    Examination:  General exam:  Appears calm and comfortable  Respiratory system: Clear to auscultation. Respiratory effort normal. Cardiovascular system: S1 & S2 heard, RRR, bradycardic Gastrointestinal system: Abdomen is nondistended, soft and nontender.  Central nervous system: Alert and oriented. No focal neurological deficits. Extremities: no LEE Skin: No rashes, lesions or ulcers Psychiatry: Judgement and insight appear normal. Mood & affect appropriate.     Data Reviewed: I have personally reviewed following labs and imaging studies  CBC: Recent Labs  Lab 02/01/21 0210 02/02/21 0234 02/03/21 0308 02/04/21 0118 02/05/21 0309  WBC 5.2 4.8 4.7 5.4 4.7  HGB 11.5* 10.5* 11.5* 10.6* 9.8*  HCT 33.4* 28.4* 32.4* 30.9* 29.1*  MCV 90.5 85.5 88.3 90.9 93.9  PLT 89* 75* 72* 83* 94*    Basic Metabolic Panel: Recent Labs  Lab 01/31/21 1912 01/31/21 1928 02/01/21 0210 02/01/21 0623 02/01/21 2254 02/02/21 0234 02/03/21 0308 02/04/21 0118 02/05/21 0309  NA  --    < > 138  138  138   < > 136 136 136 136 134*  K  --    < > 6.3*  6.3*  6.2*   < > 4.9 5.0 5.8* 5.5* 4.9  CL  --    < > 103  102  102   < > 99 96* 98 97* 96*  CO2  --    < > 16*  16*  18*   < > 25 21* 23 27 22   GLUCOSE  --    < > 112*  113*  114*   < > 134* 121* 93 128* 128*  BUN  --    < > 166*  170*  170*   < > 145* 148* 147* 107* 130*  CREATININE  --    < > 14.06*  13.86*  14.18*   < > 13.97* 14.68* 15.13* 12.73* 14.65*  CALCIUM  --    < > 7.2*  7.2*  7.1*   < > 7.0* 7.0* 7.3* 7.5* 7.4*  MG 3.0*  --  2.6*  --   --   --   --   --   --   PHOS  --   --  9.9*  9.8*  --  8.3*  --   --   --   --    < > = values in this interval not displayed.    GFR: Estimated Creatinine Clearance: 7.4 mL/min (Yocheved Depner) (by C-G formula based on SCr of 14.65 mg/dL (H)).  Liver Function Tests: Recent Labs  Lab 02/01/21 0210 02/01/21 2254 02/02/21 0234 02/03/21 0308 02/04/21 0118 02/05/21 0309  AST 847*  --  448* 254* 166* 120*  ALT 3,224*  --   1,963* 1,158* 788* 460*  ALKPHOS 100  --  84 78 72 69  BILITOT 8.6*  --  4.4* 2.4* 1.7* 1.7*  PROT 5.2*  --  4.7* 4.8* 4.8* 4.8*  ALBUMIN 3.1* 2.8* 2.7* 2.6* 2.6* 2.6*    CBG: No results for input(s): GLUCAP in the last 168 hours.   Recent Results (from the past 240 hour(s))  Resp Panel by RT-PCR (Flu Nishika Parkhurst&B, Covid) Nasopharyngeal Swab     Status: None   Collection Time: 01/31/21 10:40 PM   Specimen: Nasopharyngeal Swab; Nasopharyngeal(NP) swabs in vial transport medium  Result Value Ref Range Status   SARS Coronavirus 2 by RT PCR NEGATIVE NEGATIVE Final    Comment: (NOTE) SARS-CoV-2 target nucleic acids are NOT DETECTED.  The SARS-CoV-2 RNA is generally detectable in upper respiratory specimens during the acute phase of infection. The lowest concentration of SARS-CoV-2 viral copies this assay can detect is 138 copies/mL. Karima Carrell negative result does not preclude SARS-Cov-2 infection and should not be used as the sole basis for treatment or other patient management decisions. Isaack Preble negative result may occur with  improper specimen collection/handling, submission of specimen other than nasopharyngeal swab, presence of viral mutation(s) within the areas targeted by this assay, and inadequate number of viral copies(<138 copies/mL). Antonios Ostrow negative result must be combined with clinical observations, patient history, and epidemiological information. The expected result is Negative.  Fact Sheet for Patients:  BloggerCourse.comhttps://www.fda.gov/media/152166/download  Fact Sheet for Healthcare Providers:  SeriousBroker.ithttps://www.fda.gov/media/152162/download  This test is no t yet approved or cleared by the [place] FDA and  has been authorized for detection and/or diagnosis of SARS-CoV-2 by FDA under an Emergency Use Authorization (EUA). This EUA will remain  in effect (meaning this test can be used) for the duration of the COVID-19 declaration under Section 564(b)(1) of the Act, 21 U.S.C.section 360bbb-3(b)(1), unless  the authorization is terminated  or revoked sooner.       Influenza Darlinda Bellows by PCR NEGATIVE NEGATIVE Final   Influenza B by PCR NEGATIVE NEGATIVE Final    Comment: (NOTE) The  Xpert Xpress SARS-CoV-2/FLU/RSV plus assay is intended as an aid in the diagnosis of influenza from Nasopharyngeal swab specimens and should not be used as Triana Coover sole basis for treatment. Nasal washings and aspirates are unacceptable for Xpert Xpress SARS-CoV-2/FLU/RSV testing.  Fact Sheet for Patients: BloggerCourse.com  Fact Sheet for Healthcare Providers: SeriousBroker.it  This test is not yet approved or cleared by the [place] FDA and has been authorized for detection and/or diagnosis of SARS-CoV-2 by FDA under an Emergency Use Authorization (EUA). This EUA will remain in effect (meaning this test can be used) for the duration of the COVID-19 declaration under Section 564(b)(1) of the Act, 21 U.S.C. section 360bbb-3(b)(1), unless the authorization is terminated or revoked.  Performed at [facility], [address] [place], [place] [address]          Radiology Studies: No results found.      Scheduled Meds: . Chlorhexidine Gluconate Cloth  6 each Topical Q0600  . Chlorhexidine Gluconate Cloth  6 each Topical Q0600  . Chlorhexidine Gluconate Cloth  6 each Topical Q0600   Continuous Infusions:   LOS: 5 days    Time spent: over 30 min    Lacretia Nicks, MD [facility]   To contact the attending provider between 7A-7P or the covering provider during after hours 7P-7A, please log into the web site [email or web address] and access using universal [place] password for that web site. If you do not have the password, please call the hospital operator.  02/05/2021, 10:30 AM

## 2021-02-05 NOTE — Progress Notes (Addendum)
[facility] NEPHROLOGY PROGRESS NOTE  Assessment/ Plan: Pt is a 40 y.o. yo male who was presented on 4/20 with acute uremic symptoms for about 4 days.  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  Presents with creatinine level 14-15.  He reported that he was trying to self dehydrated for the computations and he was using oral creatinine supplements.  Denies use of NSAIDs.  #Acute kidney injury, oliguric and now dialysis dependent: Urinalysis with protein 100, some RBCs.  Exact etiology unknown ? ATN due to self dehydration (for body building competition) concomitant with N/V/D and use of oral creatinine. Platelet count was 134 so TTP/HUS was felt unlikely during admission however, given clinical presentation (eating hamburger), GI symptoms, unexplained AKI, thrombocytopenia,  there is concern for atypical HUS. I will order cbc with peripheral smear for schistocytes, ADAMTS 13 activity and consider kidney biopsy for the definitive diagnosis. The serology including ANA, ANCA, complements, hep B, hep C, HIV negative.  CT scan without hydronephrosis or renal mass.   Now presents with advanced AKI with creatinine level around 15 and uremic symptoms.  Status post RIJ TDC placed by IR on 4/21 and received 3 dialysis treatment.  He feels much better today.  Not much urine output.  Plan for another dialysis today.  Pending serum protein electrophoresis.  Unclear if kidney biopsy will add anything for his renal recovery.  #Metabolic acidosis improved with dialysis.  #Hyperkalemia improved.  #Hyperphosphatemia: start PhosLo, check PTH level.  Expect to improve with dialysis.  #Elevated liver enzymes, LFTs improving.  # Anemia: Check iron studies.  Monitor hemoglobin.  Subjective: Seen and examined bedside.  Patient reported that he feels much better than before.  Denies nausea vomiting chest pain shortness of breath.  Urine output only 275  cc. Objective Vital signs in last 24 hours: Vitals:   02/04/21 0009 02/04/21 0613 02/04/21 1958 02/05/21 0810  BP: (!) 150/70 132/62 140/82 128/78  Pulse: (!) 40 77 61 (!) 42  Resp: 16 18 16 20   Temp: 98 F (36.7 C) 98 F (36.7 C) 98.8 F (37.1 C) 98.1 F (36.7 C)  TempSrc: Oral Oral Oral Oral  SpO2: 99% 99% 99% 98%  Weight:  88.7 kg    Height:       Weight change:   Intake/Output Summary (Last 24 hours) at 02/05/2021 1121 Last data filed at 02/05/2021 0640 Gross per 24 hour  Intake 428 ml  Output 275 ml  Net 153 ml       Labs: Basic Metabolic Panel: Recent Labs  Lab 02/01/21 0210 02/01/21 0623 02/01/21 2254 02/02/21 0234 02/03/21 0308 02/04/21 0118 02/05/21 0309  NA 138  138  138   < > 136   < > 136 136 134*  K 6.3*  6.3*  6.2*   < > 4.9   < > 5.8* 5.5* 4.9  CL 103  102  102   < > 99   < > 98 97* 96*  CO2 16*  16*  18*   < > 25   < > 23 27 22   GLUCOSE 112*  113*  114*   < > 134*   < > 93 128* 128*  BUN 166*  170*  170*   < > 145*   < > 147* 107* 130*  CREATININE 14.06*  13.86*  14.18*   < > 13.97*   < > 15.13* 12.73* 14.65*  CALCIUM 7.2*  7.2*  7.1*   < >  7.0*   < > 7.3* 7.5* 7.4*  PHOS 9.9*  9.8*  --  8.3*  --   --   --   --    < > = values in this interval not displayed.   Liver Function Tests: Recent Labs  Lab 02/03/21 0308 02/04/21 0118 02/05/21 0309  AST 254* 166* 120*  ALT 1,158* 788* 460*  ALKPHOS 78 72 69  BILITOT 2.4* 1.7* 1.7*  PROT 4.8* 4.8* 4.8*  ALBUMIN 2.6* 2.6* 2.6*   Recent Labs  Lab 01/31/21 1647  LIPASE 30   Recent Labs  Lab 01/31/21 1928  AMMONIA 32   CBC: Recent Labs  Lab 02/01/21 0210 02/02/21 0234 02/03/21 0308 02/04/21 0118 02/05/21 0309  WBC 5.2 4.8 4.7 5.4 4.8  4.7  NEUTROABS  --   --   --   --  2.9  HGB 11.5* 10.5* 11.5* 10.6* 9.8*  9.8*  HCT 33.4* 28.4* 32.4* 30.9* 29.3*  29.1*  MCV 90.5 85.5 88.3 90.9 93.9  93.9  PLT 89* 75* 72* 83* 101*  94*   Cardiac Enzymes: Recent Labs   Lab 02/01/21 0210 02/02/21 0234 02/03/21 0308 02/04/21 0118 02/05/21 0309  CKTOTAL 450* 291 220 163 147   CBG: No results for input(s): GLUCAP in the last 168 hours.  Iron Studies: No results for input(s): IRON, TIBC, TRANSFERRIN, FERRITIN in the last 72 hours. Studies/Results: No results found.  Medications: Infusions:   Scheduled Medications: . Chlorhexidine Gluconate Cloth  6 each Topical Q0600  . Chlorhexidine Gluconate Cloth  6 each Topical Q0600  . Chlorhexidine Gluconate Cloth  6 each Topical Q0600    have reviewed scheduled and prn medications.  Physical Exam: General:NAD, comfortable Heart:RRR, s1s2 nl, no rubs Lungs:clear b/l, no crackle Abdomen:soft, Non-tender, non-distended Extremities:No edema Dialysis Access: RIJ TDC  Geniece Akers Jaynie Collins 02/05/2021,11:21 AM  LOS: 5 days

## 2021-02-05 NOTE — Progress Notes (Signed)
Patient AAO x4 , transported to HD dept for hemodialysis tx., denies any discomforts.

## 2021-02-06 ENCOUNTER — Inpatient Hospital Stay (HOSPITAL_COMMUNITY): Payer: Self-pay

## 2021-02-06 LAB — COMPREHENSIVE METABOLIC PANEL
ALT: 339 U/L — ABNORMAL HIGH (ref 0–44)
AST: 105 U/L — ABNORMAL HIGH (ref 15–41)
Albumin: 2.5 g/dL — ABNORMAL LOW (ref 3.5–5.0)
Alkaline Phosphatase: 75 U/L (ref 38–126)
Anion gap: 9 (ref 5–15)
BUN: 66 mg/dL — ABNORMAL HIGH (ref 6–20)
CO2: 28 mmol/L (ref 22–32)
Calcium: 7.8 mg/dL — ABNORMAL LOW (ref 8.9–10.3)
Chloride: 97 mmol/L — ABNORMAL LOW (ref 98–111)
Creatinine, Ser: 9.55 mg/dL — ABNORMAL HIGH (ref 0.61–1.24)
GFR, Estimated: 7 mL/min — ABNORMAL LOW (ref >60)
Glucose, Bld: 102 mg/dL — ABNORMAL HIGH (ref 70–99)
Potassium: 4.3 mmol/L (ref 3.5–5.1)
Sodium: 134 mmol/L — ABNORMAL LOW (ref 135–145)
Total Bilirubin: 1.3 mg/dL — ABNORMAL HIGH (ref 0.3–1.2)
Total Protein: 5 g/dL — ABNORMAL LOW (ref 6.5–8.1)

## 2021-02-06 LAB — PROTEIN ELECTROPHORESIS, SERUM
A/G Ratio: 1.3 (ref 0.7–1.7)
Albumin ELP: 2.8 g/dL — ABNORMAL LOW (ref 2.9–4.4)
Alpha-1-Globulin: 0.3 g/dL (ref 0.0–0.4)
Alpha-2-Globulin: 0.7 g/dL (ref 0.4–1.0)
Beta Globulin: 0.8 g/dL (ref 0.7–1.3)
Gamma Globulin: 0.5 g/dL (ref 0.4–1.8)
Globulin, Total: 2.2 g/dL (ref 2.2–3.9)
Total Protein ELP: 5 g/dL — ABNORMAL LOW (ref 6.0–8.5)

## 2021-02-06 LAB — CBC
HCT: 27.3 % — ABNORMAL LOW (ref 39.0–52.0)
Hemoglobin: 9.2 g/dL — ABNORMAL LOW (ref 13.0–17.0)
MCH: 31.4 pg (ref 26.0–34.0)
MCHC: 33.7 g/dL (ref 30.0–36.0)
MCV: 93.2 fL (ref 80.0–100.0)
Platelets: 119 10*3/uL — ABNORMAL LOW (ref 150–400)
RBC: 2.93 MIL/uL — ABNORMAL LOW (ref 4.22–5.81)
RDW: 16.4 % — ABNORMAL HIGH (ref 11.5–15.5)
WBC: 4.7 10*3/uL (ref 4.0–10.5)
nRBC: 0 % (ref 0.0–0.2)

## 2021-02-06 LAB — DIFFERENTIAL
Abs Immature Granulocytes: 0.04 10*3/uL (ref 0.00–0.07)
Basophils Absolute: 0 10*3/uL (ref 0.0–0.1)
Basophils Relative: 0 %
Eosinophils Absolute: 0.1 10*3/uL (ref 0.0–0.5)
Eosinophils Relative: 1 %
Immature Granulocytes: 1 %
Lymphocytes Relative: 23 %
Lymphs Abs: 1.1 10*3/uL (ref 0.7–4.0)
Monocytes Absolute: 0.8 10*3/uL (ref 0.1–1.0)
Monocytes Relative: 18 %
Neutro Abs: 2.7 10*3/uL (ref 1.7–7.7)
Neutrophils Relative %: 57 %

## 2021-02-06 LAB — CK: Total CK: 128 U/L (ref 49–397)

## 2021-02-06 LAB — PATHOLOGIST SMEAR REVIEW

## 2021-02-06 MED ORDER — HEPARIN SODIUM (PORCINE) 1000 UNIT/ML IJ SOLN
INTRAMUSCULAR | Status: AC
  Administered 2021-02-06: 3200 [IU] via INTRAVENOUS
  Filled 2021-02-06: qty 4

## 2021-02-06 MED ORDER — DARBEPOETIN ALFA 100 MCG/0.5ML IJ SOSY: 100.0000 ug | PREFILLED_SYRINGE | INTRAMUSCULAR | Status: DC

## 2021-02-06 MED ORDER — SODIUM CHLORIDE 0.9 % IV SOLN
250.0000 mg | Freq: Every day | INTRAVENOUS | Status: AC
  Administered 2021-02-06 – 2021-02-07 (×2): 250 mg via INTRAVENOUS
  Filled 2021-02-06 (×2): qty 20

## 2021-02-06 MED ORDER — HEPARIN SODIUM (PORCINE) 1000 UNIT/ML IJ SOLN
INTRAMUSCULAR | Status: AC
  Filled 2021-02-06: qty 4

## 2021-02-06 MED ORDER — CHLORHEXIDINE GLUCONATE CLOTH 2 % EX PADS: 6.0000 | MEDICATED_PAD | Freq: Every day | CUTANEOUS | Status: DC

## 2021-02-06 MED ORDER — DARBEPOETIN ALFA 100 MCG/0.5ML IJ SOSY
PREFILLED_SYRINGE | INTRAMUSCULAR | Status: AC
  Administered 2021-02-06: 100 ug via INTRAVENOUS
  Filled 2021-02-06: qty 0.5

## 2021-02-06 MED ORDER — HEPARIN SODIUM (PORCINE) 1000 UNIT/ML IJ SOLN: 3200.0000 [IU] | INTRAMUSCULAR | Status: DC | PRN

## 2021-02-06 NOTE — Progress Notes (Signed)
[facility] NEPHROLOGY PROGRESS NOTE  Assessment/ Plan: Pt is a 40 y.o. yo male who was presented on 4/20 with acute uremic symptoms for about 4 days.  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  Presents with creatinine level 14-15.  He reported that he was trying to self dehydrated for the computations and he was using oral creatinine supplements.  Denies use of NSAIDs.  #Acute kidney injury, oliguric and now dialysis dependent: Urinalysis with protein 100, some RBCs.  Exact etiology unknown ? ATN due to self dehydration (for body building competition),use of OTC diuretic pill Xpel concomitant with N/V/D and use of oral creatinine. Platelet count was 134 so TTP/HUS was felt unlikely during admission. However, given clinical presentation (eating hamburger), GI symptoms, unexplained AKI, thrombocytopenia,  there is concern for atypical HUS. Contacted [facility] central lab for peripheral smear for schistocytes, they will review and then we can see the result, pending ADAMTS 13 activity.  I have discussed about kidney biopsy in order to diagnosis AKI and the risk including bleeding, infection, loss of kidney function etc. with the patient.  He agreed with the procedure.  IR consulted.  The serology including ANA, ANCA, complements, hep B, hep C, HIV negative.  CT scan without hydronephrosis or renal mass.    Status post RIJ TDC placed by IR on 4/21 has received for dialysis session.  He had last HD yesterday for 4 hours without UF.  Not much urine output unsure if it is accurately recorded.  Volume looks acceptable.  Plan for another HD today for clearance.    Pending serum protein electrophoresis.    #Metabolic acidosis improved with dialysis.  #Hyperkalemia improved.  #Hyperphosphatemia: start PhosLo, check PTH level.  Expect to improve with dialysis.  #Elevated liver enzymes, LFTs improving.  # Anemia:Iron saturation 21%.  Order IV iron and a dose of  Aranesp. Monitor hemoglobin.  Subjective: Seen and examined.  Urine output only recorded 95 cc, not accurately measured.  Denies nausea vomiting chest pain shortness of breath.  Frustrated about current situation.  Objective Vital signs in last 24 hours: Vitals:   02/06/21 0443 02/06/21 0552 02/06/21 0750 02/06/21 1024  BP: 126/76  137/66 132/60  Pulse: (!) 45  (!) 53 (!) 53  Resp: 16  15   Temp: 98.6 F (37 C)  98.5 F (36.9 C) 98.8 F (37.1 C)  TempSrc: Oral  Oral Oral  SpO2: 100%     Weight:  91 kg    Height:       Weight change:   Intake/Output Summary (Last 24 hours) at 02/06/2021 1047 Last data filed at 02/06/2021 0015 Gross per 24 hour  Intake 820 ml  Output 95 ml  Net 725 ml       Labs: Basic Metabolic Panel: Recent Labs  Lab 02/01/21 0210 02/01/21 0623 02/01/21 2254 02/02/21 0234 02/04/21 0118 02/05/21 0309 02/06/21 0303  NA 138  138  138   < > 136   < > 136 134* 134*  K 6.3*  6.3*  6.2*   < > 4.9   < > 5.5* 4.9 4.3  CL 103  102  102   < > 99   < > 97* 96* 97*  CO2 16*  16*  18*   < > 25   < > 27 22 28   GLUCOSE 112*  113*  114*   < > 134*   < > 128* 128* 102*  BUN 166*  170*  170*   < > 145*   < > 107* 130* 66*  CREATININE 14.06*  13.86*  14.18*   < > 13.97*   < > 12.73* 14.65* 9.55*  CALCIUM 7.2*  7.2*  7.1*   < > 7.0*   < > 7.5* 7.4* 7.8*  PHOS 9.9*  9.8*  --  8.3*  --   --   --   --    < > = values in this interval not displayed.   Liver Function Tests: Recent Labs  Lab 02/04/21 0118 02/05/21 0309 02/06/21 0303  AST 166* 120* 105*  ALT 788* 460* 339*  ALKPHOS 72 69 75  BILITOT 1.7* 1.7* 1.3*  PROT 4.8* 4.8* 5.0*  ALBUMIN 2.6* 2.6* 2.5*   Recent Labs  Lab 01/31/21 1647  LIPASE 30   Recent Labs  Lab 01/31/21 1928  AMMONIA 32   CBC: Recent Labs  Lab 02/03/21 0308 02/04/21 0118 02/05/21 0309 02/05/21 1549 02/06/21 0303  WBC 4.7 5.4 4.8  4.7 4.6 4.7  NEUTROABS  --   --  2.9 2.9  --   HGB 11.5* 10.6* 9.8*   9.8* 9.8* 9.2*  HCT 32.4* 30.9* 29.3*  29.1* 29.2* 27.3*  MCV 88.3 90.9 93.9  93.9 93.9 93.2  PLT 72* 83* 101*  94* 117* 119*   Cardiac Enzymes: Recent Labs  Lab 02/02/21 0234 02/03/21 0308 02/04/21 0118 02/05/21 0309 02/06/21 0303  CKTOTAL 291 220 163 147 128   CBG: No results for input(s): GLUCAP in the last 168 hours.  Iron Studies:  Recent Labs    02/05/21 1006  IRON 46  TIBC 221*  FERRITIN 685*   Studies/Results: US Abdomen Limited RUQ (LIVER/GB)  Result Date: 02/06/2021 CLINICAL DATA:  Elevated liver enzymes. EXAM: ULTRASOUND ABDOMEN LIMITED RIGHT UPPER QUADRANT COMPARISON:  CT 01/31/2021 FINDINGS: Gallbladder: No gallstones or wall thickening visualized. No sonographic Murphy sign noted by sonographer. Common bile duct: Diameter: 3.5 mm, normal Liver: Increased parenchymal echotexture suggesting fatty infiltration. No focal liver lesions. Portal vein is patent on color Doppler imaging with normal direction of blood flow towards the liver. Other: Diffuse abdominal ascites.  Right pleural effusion. IMPRESSION: Diffuse abdominal ascites. Right pleural effusion. Fatty infiltration of the liver. No evidence of cholelithiasis or cholecystitis. Electronically Signed   By: Burman Nieves M.D.   On: 02/06/2021 01:49    Medications: Infusions:   Scheduled Medications: . calcium acetate  1,334 mg Oral TID WC  . Chlorhexidine Gluconate Cloth  6 each Topical Q0600  . Chlorhexidine Gluconate Cloth  6 each Topical Q0600  . Chlorhexidine Gluconate Cloth  6 each Topical Q0600  . Chlorhexidine Gluconate Cloth  6 each Topical Q0600  . heparin sodium (porcine)        have reviewed scheduled and prn medications.  Physical Exam: General:NAD, comfortable Heart:RRR, s1s2 nl, no rubs Lungs: Clear  b/l, no crackle Abdomen:soft, Non-tender, non-distended Extremities: Trace edema Dialysis Access: RIJ TDC  Othell Diluzio Prasad Takeia Ciaravino 02/06/2021,10:47 AM  LOS: 6 days

## 2021-02-06 NOTE — Consult Note (Signed)
Chief Complaint: Patient was seen in consultation today for random renal biopsy at the request of Dr Ronalee Belts   Supervising Physician: Mir, Mauri Reading  Patient Status: [facility] - In-pt  History of Present Illness: Evan Vargas is a 40 y.o. 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 Paroxysmal atrial fibrillation (HCC)    dx'ed in 2019    Past Surgical History:  Procedure Laterality Date  . ANTERIOR CRUCIATE LIGAMENT REPAIR     left  . ANTERIOR CRUCIATE LIGAMENT REPAIR  2004  . IR PERC TUN PERIT CATH WO PORT S&I /IMAG  02/01/2021    Allergies: Patient has no known allergies.  Medications: Prior to Admission medications   Medication Sig Start Date End Date Taking? Authorizing Provider  acetaminophen (TYLENOL) 500 MG tablet Take 1,000 mg by mouth every 6 (six) hours as needed for moderate pain.   Yes [provider]  Creatine POWD Take 1 Scoop by mouth daily.   Yes [provider]  Glutamine POWD Take 1 Scoop by mouth daily.   Yes [provider]  Multiple Vitamin (MULTIVITAMIN WITH MINERALS) TABS tablet Take 1 tablet by mouth daily.   Yes [provider]  Multiple Vitamins-Minerals (ZINC PO) Take 1 tablet by mouth daily.   Yes [provider]  apixaban (ELIQUIS) 5 MG TABS tablet Take 1 tablet (5 mg total) by mouth 2 (two) times daily. Patient not taking: No sig reported 01/05/18   Alberteen Sam, MD  metoprolol succinate (TOPROL-XL) 25 MG 24 hr tablet Take 1 tablet (25 mg total) by mouth daily. Patient not taking: No sig reported 01/06/18   Alberteen Sam, MD     Family  History  Problem Relation Age of Onset  . Heart disease Maternal Grandmother     Social History   Socioeconomic History  . Marital status: Single    Spouse name: Not on file  . Number of children: Not on file  . Years of education: Not on file  . Highest education level: Not on file  Occupational History  . Not on file  Tobacco Use  . Smoking status: Never Smoker  . Smokeless tobacco: Never Used  Substance and Sexual Activity  . Alcohol use: No  . Drug use: No  . Sexual activity: Not on file  Other Topics Concern  . Not on file  Social History Narrative  . Not on file   Social Determinants of Health   Financial Resource Strain: Not on file  Food Insecurity: Not on file  Transportation Needs: Not on file  Physical Activity: Not on file  Stress: Not on file  Social Connections: Not on file     Review of Systems: A 12 point ROS discussed and pertinent positives are indicated in the HPI above.  All other systems are negative.  Review of Systems  Constitutional: Positive for activity change and fatigue. Negative for fever.  Respiratory: Negative for cough and shortness of breath.   Cardiovascular: Negative for chest pain.  Gastrointestinal: Negative for abdominal pain.  Genitourinary: Positive for decreased urine volume.  Neurological: Positive for weakness.  Psychiatric/Behavioral: Negative for behavioral problems and confusion.  Vital Signs: BP 138/73   Pulse (!) 52   Temp 98.8 F (37.1 C) (Oral)   Resp 17   Ht 5\' 8"  (1.727 m)   Wt 205 lb 0.4 oz (93 kg)   SpO2 98%   BMI 31.17 kg/m   Physical Exam Vitals reviewed.  HENT:     Mouth/Throat:     Mouth: Mucous membranes are moist.  Cardiovascular:     Rate and Rhythm: Normal rate and regular rhythm.     Heart sounds: Normal heart sounds.  Pulmonary:     Effort: Pulmonary effort is normal.     Breath sounds: Normal breath sounds.  Abdominal:     Palpations: Abdomen is soft.  Musculoskeletal:         General: Normal range of motion.  Skin:    General: Skin is warm.  Neurological:     Mental Status: He is alert and oriented to person, place, and time.  Psychiatric:        Behavior: Behavior normal.     Imaging: CT ABDOMEN PELVIS WO CONTRAST  Result Date: 01/31/2021 CLINICAL DATA:  Acute abdominal pain for several days EXAM: CT ABDOMEN AND PELVIS WITHOUT CONTRAST TECHNIQUE: Multidetector CT imaging of the abdomen and pelvis was performed following the standard protocol without IV contrast. COMPARISON:  None. FINDINGS: Lower chest: Small bilateral pleural effusions and mild dependent atelectasis. Hepatobiliary: Liver is within normal limits. Gallbladder is well distended with dependent density likely related to sludge. Pericholecystic fluid is noted although ascites is seen. Pancreas: Unremarkable. No pancreatic ductal dilatation or surrounding inflammatory changes. Spleen: Normal in size without focal abnormality. Adrenals/Urinary Tract: Adrenal glands are within normal limits. Kidneys show no renal calculi or definitive obstructive changes. Bladder is partially distended. Stomach/Bowel: Colon shows no obstructive or inflammatory changes. The appendix is not well appreciated although no inflammatory changes are seen. Vascular/Lymphatic: No significant vascular findings are present. No enlarged abdominal or pelvic lymph nodes. Reproductive: Prostate is unremarkable. Other: Mild diffuse ascites is noted throughout the abdomen. Some generalized edema within the mesenteric fat is noted as well likely related to third spacing. Musculoskeletal: No acute or significant osseous findings. IMPRESSION: Small effusions and dependent atelectatic changes without focal confluent infiltrate. Mild ascites with reactive changes in the abdomen and pelvis. Changes are likely related to the patient's known chronic congestive failure. Gallbladder is well distended with pericholecystic fluid likely related to the underlying  ascites. The need for further evaluation can be determined on a clinical basis. Electronically Signed   By: Alcide CleverMark  Lukens M.D.   On: 01/31/2021 20:46   IR TUNNELED CENTRAL VENOUS CATHETER PLACEMENT  Result Date: 02/01/2021 INDICATION: 40 year old with acute kidney injury. Catheter needed for hemodialysis. EXAM: FLUOROSCOPIC AND ULTRASOUND GUIDED PLACEMENT OF A TUNNELED DIALYSIS CATHETER Physician: Rachelle HoraAdam R. Lowella DandyHenn, MD MEDICATIONS: Ancef 2 g; The antibiotic was administered within an appropriate time interval prior to skin puncture. ANESTHESIA/SEDATION: Versed 0.0 mg IV; Fentanyl 50 mcg IV; Moderate Sedation Time:  24 minutes The patient was continuously monitored during the procedure by the interventional radiology nurse under my direct supervision. FLUOROSCOPY TIME:  Fluoroscopy Time: 2 minutes, 24 seconds, 10 mGy COMPLICATIONS: None immediate. PROCEDURE: The procedure was explained to the patient. The risks and benefits of the procedure were discussed and the patient's questions were addressed. Informed consent was obtained from the patient. The patient was placed supine on the interventional table. Ultrasound confirmed a patent right internal jugular vein. Ultrasound images were obtained for documentation. The right neck and  chest was prepped and draped in a sterile fashion. Maximal barrier sterile technique was utilized including caps, mask, sterile gowns, sterile gloves, sterile drape, hand hygiene and skin antiseptic. The right neck was anesthetized with 1% lidocaine. A small incision was made with #11 blade scalpel. A 21 gauge needle directed into the right internal jugular vein with ultrasound guidance. A micropuncture dilator set was placed. A 19 cm tip to cuff Palindrome catheter was selected. The skin below the right clavicle was anesthetized and a small incision was made with an #11 blade scalpel. A subcutaneous tunnel was formed to the vein dermatotomy site. The catheter was brought through the tunnel.  Micropuncture catheter was removed over a superstiff Amplatz wire. The vein dermatotomy site was dilated to accommodate a peel-away sheath. The catheter was placed through the peel-away sheath and directed into the central venous structures. The tip of the catheter was placed at the superior cavoatrial junction with fluoroscopy. Fluoroscopic images were obtained for documentation. Both lumens were found to aspirate and flush well. The proper amount of heparin was flushed in both lumens. The vein dermatotomy site was closed using a single layer of absorbable suture and Dermabond. The catheter was secured to the skin using Prolene suture. IMPRESSION: Successful placement of a right jugular tunneled dialysis catheter using ultrasound and fluoroscopic guidance. Electronically Signed   By: Richarda Overlie M.D.   On: 02/01/2021 16:33   US Abdomen Limited RUQ (LIVER/GB)  Result Date: 02/06/2021 CLINICAL DATA:  Elevated liver enzymes. EXAM: ULTRASOUND ABDOMEN LIMITED RIGHT UPPER QUADRANT COMPARISON:  CT 01/31/2021 FINDINGS: Gallbladder: No gallstones or wall thickening visualized. No sonographic Murphy sign noted by sonographer. Common bile duct: Diameter: 3.5 mm, normal Liver: Increased parenchymal echotexture suggesting fatty infiltration. No focal liver lesions. Portal vein is patent on color Doppler imaging with normal direction of blood flow towards the liver. Other: Diffuse abdominal ascites.  Right pleural effusion. IMPRESSION: Diffuse abdominal ascites. Right pleural effusion. Fatty infiltration of the liver. No evidence of cholelithiasis or cholecystitis. Electronically Signed   By: Burman Nieves M.D.   On: 02/06/2021 01:49    Labs:  CBC: Recent Labs    02/04/21 0118 02/05/21 0309 02/05/21 1549 02/06/21 0303  WBC 5.4 4.8  4.7 4.6 4.7  HGB 10.6* 9.8*  9.8* 9.8* 9.2*  HCT 30.9* 29.3*  29.1* 29.2* 27.3*  PLT 83* 101*  94* 117* 119*    COAGS: Recent Labs    01/31/21 1912 02/01/21 0210  INR  1.1 1.1    BMP: Recent Labs    02/03/21 0308 02/04/21 0118 02/05/21 0309 02/06/21 0303  NA 136 136 134* 134*  K 5.8* 5.5* 4.9 4.3  CL 98 97* 96* 97*  CO2 23 27 22 28   GLUCOSE 93 128* 128* 102*  BUN 147* 107* 130* 66*  CALCIUM 7.3* 7.5* 7.4* 7.8*  CREATININE 15.13* 12.73* 14.65* 9.55*  GFRNONAA 4* 5* 4* 7*    LIVER FUNCTION TESTS: Recent Labs    02/03/21 0308 02/04/21 0118 02/05/21 0309 02/06/21 0303  BILITOT 2.4* 1.7* 1.7* 1.3*  AST 254* 166* 120* 105*  ALT 1,158* 788* 460* 339*  ALKPHOS 78 72 69 75  PROT 4.8* 4.8* 4.8* 5.0*  ALBUMIN 2.6* 2.6* 2.6* 2.5*    TUMOR MARKERS: No results for input(s): AFPTM, CEA, CA199, CHROMGRNA in the last 8760 hours.  Assessment and Plan:  Acute kidney injury- acute renal failure Body builder taking many Cr supplements and self drhydrating tunn dialysis cath placed in IR 4/21-- Cr  trending down Scheduled now for random renal biopsy per Dr Ronalee Belts Planned for 4/27 in IR Risks and benefits of random renal biopsy was discussed with the patient and/or patient's family including, but not limited to bleeding, infection, damage to adjacent structures or low yield requiring additional tests.  All of the questions were answered and there is agreement to proceed.  Consent signed and in chart.   Thank you for this interesting consult.  I greatly enjoyed meeting Evan Vargas and look forward to participating in their care.  A copy of this report was sent to the requesting provider on this date.  Electronically Signed: Robet Leu, PA-C 02/06/2021, 1:04 PM   I spent a total of 20 Minutes    in face to face in clinical consultation, greater than 50% of which was counseling/coordinating care for random renal biopsy

## 2021-02-06 NOTE — Progress Notes (Signed)
PROGRESS NOTE    Evan Vargas  FOY:[id number] DOB: August 01, 1981 DOA: 01/31/2021 PCP: Pcp, No   No chief complaint on file.   Brief Narrative:  Evan Vargas Evan Vargas 40 y.o.malewith medical history significant forparoxysmal atrial fibrillation not on chronic anticoagulation, chronic diastolic heart failure,[facility] acute renal failureafter presenting from home to [facility] complaining of profound nausea vomiting and diarrhea without any signs or symptoms of bleeding.  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  Nephrology consulted recommending transfer to [facility] for further evaluation and likely dialysis given profoundly elevated creatinine and potassium.  Patient tolerating dialysis quite well thus far, labs continue to improve drastically but not yet back to baseline.  Unfortunately patient's creatinine and BUN are somewhat stable and do not appear to be wholly improved by dialysis.  Unclear at this time whether or not patient will be able to come off dialysis in the interim or will need to be scheduled for dialysis in hopes that his renal function returns in the near future.  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  He was taking expel (otc diuretic) prior to the competition.  Also takes creatine, protein, glutamine, salt substitute.  No significant NSAID exposure based on hx.  Etiology unclear, appreciate renal assistance.  ? ATN related to  dehydration in preparation for body building competition, compounded by N/V/D he developed after competition.  TTP/HUS thought unlikely given mildly reduced platelets on presentation, though he did have possible foodborne illness which led to N/V/D after eating burger (cooked medium). - baseline creatinine in 2019 was 1.3 - presented with creatinine of 13.87, BUN 171  - Temporary dialysis catheter placement 02/01/2021 - dialysis per renal - negative ANA, MPO/PR3, C3/C4.  Pending urine immunofixation, SPEP, light chains (elevated kappa free light chain, normal kappa, lamda light chain ratio).  CK mildly elevated at presentation, normal at this time.  ADAMTS13 pending.  Smear to eval for schistocytes pending. - UA with proteinuria/hematuria  - UP/C 1.74 - consider GI pathogen panel if diarrhea recurs - UOP minimal, oligoanuric (only 95 cc recorded yesterday, unclear if accurate)  Hyperkalemia -2/2 above, he was taking salt substitute as well   Nausea  Vomiting  Diarrhea - ? foodborne illness  - improving - low threshold for GI path panel  Acutely elevated LFTs  Elevated Bilirubin  Fatty Liver Disease - most obvious explanation would be shock liver in setting of hypovolemia with dehydration due to above vs medication induced from supplements? - negative acute hepatitis panel.  Negative HIV.  No significant tylenol use.  He does take many supplements (creatine, glutamine, protein, expel) -> not sure if these could have contributed.   - follow RUQ [place] -> diffuse abdominal ascites, right pleural effusion, fatty infiltration of the liver - currently improving -> he probably would benefit from GI follow up outpatient given ALT/AST 5044/1427 and bilirubin 9.7 at presentation -> of note, his LFT's were not normal in 2019 (possible insult on top of underlying liver disease?)  Right Pleural Effusion  Ascites - Suspect related to volume overload with AKI   Thrombocytopenia, minimally improving -  unclear reason, follow smear  Anion gap metabolic acidosis in the setting of above and uremia, resolving - 2/2 AKI, follow with dialysis, renal  Elevated CPK/rhabdomyolysis -only mildly elevated, don't think this was etiology for his AKI -resolved  Paroxysmal atrial fibrillation, known history, without RVR, likely provoked and resolved Asymptomatic sinus bradycardia: -CHA2DS2-VASc of 1, no indication for anticoagulation - appears plan at 2019 visit was to d/c anticoagulation after 4 weeks.   -sinus brady on tele -Continue to follow clinically, on telemetry given above -Asymptomatic bradycardic in the setting of increased cardiac reserves given lifestyle and workout regimen (typically seen in high endurance athletes)  Chronic diastolic heart failure -Continue to follow clinically, mild ascites on incoming imaging, appears euvolemic on exam today -Volume control with dialysis as above  DVT prophylaxis: SCD Code Status: full  Family Communication: none at bedside Disposition:   Status is: Inpatient  Remains inpatient appropriate because:Inpatient level of care appropriate due to severity of illness   Dispo: The patient is from: Home              Anticipated d/c is to: Home              Patient currently is not medically stable to d/c.   Difficult to place patient No   Consultants:   renal  Procedures:  Dialysis catheter 4/21  Antimicrobials:  Anti-infectives (From admission, onward)   Start     Dose/Rate Route Frequency Ordered Stop   02/01/21 1505  ceFAZolin (ANCEF) 2-4 GM/100ML-% IVPB       Note to Pharmacy: Cristy Friedlander   : cabinet override      02/01/21 1505 02/01/21 1516      Subjective: No new complaints Tired, not getting to sleep   Objective: Vitals:   02/06/21 0044 02/06/21 0443 02/06/21 0552 02/06/21 0750  BP: (!) 143/90 126/76  137/66  Pulse: (!) 51 (!) 45  (!) 53  Resp: 16 16  15   Temp: 98.7 F (37.1 C) 98.6 F (37 C)  98.5 F (36.9 C)   TempSrc: Oral Oral  Oral  SpO2: 96% 100%    Weight:   91 kg   Height:        Intake/Output Summary (Last 24 hours) at 02/06/2021 1001 Last data filed at 02/06/2021 0015 Gross per 24 hour  Intake 820 ml  Output 95 ml  Net 725 ml   Filed Weights   02/03/21 1559 02/04/21 0613 02/06/21 0552  Weight: 89.1 kg 88.7 kg 91 kg    Examination:  General: No acute distress. Cardiovascular: Heart sounds show Charolette Bultman regular rate, and rhythm. Lungs: Clear to auscultation bilaterally Abdomen: Soft, nontender, nondistended Neurological: Alert and oriented 3. Moves all extremities 4. Cranial nerves II through XII grossly intact. Skin: Warm and dry. No rashes or lesions. Extremities: No clubbing or cyanosis. No edema.   Data Reviewed: I have personally reviewed following labs and imaging studies  CBC: Recent Labs  Lab 02/03/21 0308 02/04/21 0118 02/05/21 0309 02/05/21 1549 02/06/21 0303  WBC 4.7 5.4 4.8  4.7 4.6 4.7  NEUTROABS  --   --  2.9 2.9  --   HGB 11.5* 10.6* 9.8*  9.8* 9.8* 9.2*  HCT 32.4* 30.9* 29.3*  29.1* 29.2* 27.3*  MCV 88.3 90.9 93.9  93.9 93.9 93.2  PLT 72* 83* 101*  94* 117* 119*    Basic Metabolic Panel: Recent Labs  Lab 01/31/21 1912 01/31/21 1928 02/01/21 0210 02/01/21 [id number] 02/01/21 2254 02/02/21 0234 02/03/21 0308 02/04/21 0118 02/05/21 0309 02/06/21 0303  NA  --    < > 138  138  138   < > 136 136 136 136 134* 134*  K  --    < > 6.3*  6.3*  6.2*   < > 4.9 5.0 5.8* 5.5* 4.9 4.3  CL  --    < > 103  102  102   < > 99 96* 98 97* 96* 97*  CO2  --    < > 16*  16*  18*   < > 25 21* 23 27 22 28   GLUCOSE  --    < > 112*  113*  114*   < > 134* 121* 93 128* 128* 102*  BUN  --    < > 166*  170*  170*   < > 145* 148* 147* 107* 130* 66*  CREATININE  --    < > 14.06*  13.86*  14.18*   < > 13.97* 14.68* 15.13* 12.73* 14.65* 9.55*  CALCIUM  --    < > 7.2*  7.2*  7.1*   < > 7.0* 7.0* 7.3* 7.5* 7.4* 7.8*  MG 3.0*  --  2.6*  --   --   --   --   --   --    --   PHOS  --   --  9.9*  9.8*  --  8.3*  --   --   --   --   --    < > = values in this interval not displayed.    GFR: Estimated Creatinine Clearance: 11.5 mL/min (Kyros Salzwedel) (by C-G formula based on SCr of 9.55 mg/dL (H)).  Liver Function Tests: Recent Labs  Lab 02/02/21 0234 02/03/21 0308 02/04/21 0118 02/05/21 0309 02/06/21 0303  AST 448* 254* 166* 120* 105*  ALT 1,963* 1,158* 788* 460* 339*  ALKPHOS 84 78 72 69 75  BILITOT 4.4* 2.4* 1.7* 1.7* 1.3*  PROT 4.7* 4.8* 4.8* 4.8* 5.0*  ALBUMIN 2.7* 2.6* 2.6* 2.6* 2.5*    CBG: No results for input(s): GLUCAP in the last 168 hours.   Recent Results (from the past 240 hour(s))  Resp Panel by RT-PCR (Flu Lesta Limbert&B, Covid) Nasopharyngeal Swab     Status: None   Collection Time: 01/31/21 10:40 PM   Specimen: Nasopharyngeal Swab; Nasopharyngeal(NP) swabs in vial transport medium  Result Value Ref Range Status   SARS Coronavirus 2 by RT PCR NEGATIVE NEGATIVE Final    Comment: (NOTE) SARS-CoV-2 target nucleic acids are NOT DETECTED.  The SARS-CoV-2 RNA is generally detectable in upper respiratory specimens during the acute phase of infection. The lowest concentration of SARS-CoV-2 viral copies this assay can detect is 138 copies/mL. Laura Caldas negative result does not preclude SARS-Cov-2 infection and should not be used as the sole basis for treatment or other patient management decisions. Sabrina Keough negative result may occur with  improper specimen collection/handling, submission of specimen other than nasopharyngeal swab, presence of viral mutation(s) within the areas targeted by this assay, and inadequate number of viral copies(<138 copies/mL). Hernandez Losasso negative result must be combined with clinical observations, patient history, and epidemiological information. The expected result is Negative.  Fact Sheet for Patients:  BloggerCourse.comhttps://www.fda.gov/media/152166/download  Fact Sheet for Healthcare Providers:  SeriousBroker.ithttps://www.fda.gov/media/152162/download  This test  is no t yet approved or cleared by the [place] FDA and  has been authorized for detection and/or diagnosis of SARS-CoV-2  by FDA under an Emergency Use Authorization (EUA). This EUA will remain  in effect (meaning this test can be used) for the duration of the COVID-19 declaration under Section 564(b)(1) of the Act, 21 U.S.C.section 360bbb-3(b)(1), unless the authorization is terminated  or revoked sooner.       Influenza Jovanni Rash by PCR NEGATIVE NEGATIVE Final   Influenza B by PCR NEGATIVE NEGATIVE Final    Comment: (NOTE) The Xpert Xpress SARS-CoV-2/FLU/RSV plus assay is intended as an aid in the diagnosis of influenza from Nasopharyngeal swab specimens and should not be used as Myron Stankovich sole basis for treatment. Nasal washings and aspirates are unacceptable for Xpert Xpress SARS-CoV-2/FLU/RSV testing.  Fact Sheet for Patients: BloggerCourse.com  Fact Sheet for Healthcare Providers: SeriousBroker.it  This test is not yet approved or cleared by the [place] FDA and has been authorized for detection and/or diagnosis of SARS-CoV-2 by FDA under an Emergency Use Authorization (EUA). This EUA will remain in effect (meaning this test can be used) for the duration of the COVID-19 declaration under Section 564(b)(1) of the Act, 21 U.S.C. section 360bbb-3(b)(1), unless the authorization is terminated or revoked.  Performed at [facility], [address], [place], [place] [address]          Radiology Studies: US Abdomen Limited RUQ (LIVER/GB)  Result Date: 02/06/2021 CLINICAL DATA:  Elevated liver enzymes. EXAM: ULTRASOUND ABDOMEN LIMITED RIGHT UPPER QUADRANT COMPARISON:  CT 01/31/2021 FINDINGS: Gallbladder: No gallstones or wall thickening visualized. No sonographic Murphy sign noted by sonographer. Common bile duct: Diameter: 3.5 mm, normal Liver: Increased parenchymal echotexture suggesting fatty infiltration. No  focal liver lesions. Portal vein is patent on color Doppler imaging with normal direction of blood flow towards the liver. Other: Diffuse abdominal ascites.  Right pleural effusion. IMPRESSION: Diffuse abdominal ascites. Right pleural effusion. Fatty infiltration of the liver. No evidence of cholelithiasis or cholecystitis. Electronically Signed   By: Burman Nieves M.D.   On: 02/06/2021 01:49        Scheduled Meds: . calcium acetate  1,334 mg Oral TID WC  . Chlorhexidine Gluconate Cloth  6 each Topical Q0600  . Chlorhexidine Gluconate Cloth  6 each Topical Q0600  . Chlorhexidine Gluconate Cloth  6 each Topical Q0600  . Chlorhexidine Gluconate Cloth  6 each Topical Q0600  . heparin sodium (porcine)       Continuous Infusions:   LOS: 6 days    Time spent: over 30 min    Lacretia Nicks, MD [facility]   To contact the attending provider between 7A-7P or the covering provider during after hours 7P-7A, please log into the web site [email or web address] and access using universal [place] password for that web site. If you do not have the password, please call the hospital operator.  02/06/2021, 10:01 AM

## 2021-02-07 ENCOUNTER — Inpatient Hospital Stay (HOSPITAL_COMMUNITY): Payer: Self-pay

## 2021-02-07 DIAGNOSIS — R001 Bradycardia, unspecified: Secondary | ICD-10-CM

## 2021-02-07 DIAGNOSIS — R748 Abnormal levels of other serum enzymes: Secondary | ICD-10-CM

## 2021-02-07 LAB — COMPREHENSIVE METABOLIC PANEL
ALT: 283 U/L — ABNORMAL HIGH (ref 0–44)
AST: 93 U/L — ABNORMAL HIGH (ref 15–41)
Albumin: 2.7 g/dL — ABNORMAL LOW (ref 3.5–5.0)
Alkaline Phosphatase: 72 U/L (ref 38–126)
Anion gap: 11 (ref 5–15)
BUN: 53 mg/dL — ABNORMAL HIGH (ref 6–20)
CO2: 26 mmol/L (ref 22–32)
Calcium: 7.9 mg/dL — ABNORMAL LOW (ref 8.9–10.3)
Chloride: 98 mmol/L (ref 98–111)
Creatinine, Ser: 8.25 mg/dL — ABNORMAL HIGH (ref 0.61–1.24)
GFR, Estimated: 8 mL/min — ABNORMAL LOW (ref >60)
Glucose, Bld: 87 mg/dL (ref 70–99)
Potassium: 4.1 mmol/L (ref 3.5–5.1)
Sodium: 135 mmol/L (ref 135–145)
Total Bilirubin: 1.4 mg/dL — ABNORMAL HIGH (ref 0.3–1.2)
Total Protein: 5.3 g/dL — ABNORMAL LOW (ref 6.5–8.1)

## 2021-02-07 LAB — CBC
HCT: 31.9 % — ABNORMAL LOW (ref 39.0–52.0)
Hemoglobin: 10.5 g/dL — ABNORMAL LOW (ref 13.0–17.0)
MCH: 31.6 pg (ref 26.0–34.0)
MCHC: 32.9 g/dL (ref 30.0–36.0)
MCV: 96.1 fL (ref 80.0–100.0)
Platelets: 145 10*3/uL — ABNORMAL LOW (ref 150–400)
RBC: 3.32 MIL/uL — ABNORMAL LOW (ref 4.22–5.81)
RDW: 16.5 % — ABNORMAL HIGH (ref 11.5–15.5)
WBC: 6.5 10*3/uL (ref 4.0–10.5)
nRBC: 0 % (ref 0.0–0.2)

## 2021-02-07 LAB — IMMUNOFIXATION, URINE

## 2021-02-07 LAB — PARATHYROID HORMONE, INTACT (NO CA): PTH: 38 pg/mL (ref 15–65)

## 2021-02-07 LAB — CK: Total CK: 119 U/L (ref 49–397)

## 2021-02-07 MED ORDER — GELATIN ABSORBABLE 12-7 MM EX MISC
CUTANEOUS | Status: AC | PRN
  Administered 2021-02-07: 1 via TOPICAL

## 2021-02-07 MED ORDER — LIDOCAINE HCL (PF) 1 % IJ SOLN
INTRAMUSCULAR | Status: AC
  Filled 2021-02-07: qty 30

## 2021-02-07 MED ORDER — METOLAZONE 5 MG PO TABS
5.0000 mg | ORAL_TABLET | Freq: Once | ORAL | Status: AC
  Administered 2021-02-07: 5 mg via ORAL
  Filled 2021-02-07: qty 1

## 2021-02-07 MED ORDER — MIDAZOLAM HCL 2 MG/2ML IJ SOLN
INTRAMUSCULAR | Status: AC | PRN
  Administered 2021-02-07 (×2): 1 mg via INTRAVENOUS

## 2021-02-07 MED ORDER — FENTANYL CITRATE (PF) 100 MCG/2ML IJ SOLN
INTRAMUSCULAR | Status: AC
  Filled 2021-02-07: qty 2

## 2021-02-07 MED ORDER — CHLORHEXIDINE GLUCONATE CLOTH 2 % EX PADS
6.0000 | MEDICATED_PAD | Freq: Every day | CUTANEOUS | Status: DC
  Administered 2021-02-08 – 2021-02-14 (×5): 6 via TOPICAL

## 2021-02-07 MED ORDER — KIDNEY FAILURE BOOK: Freq: Once | Status: AC

## 2021-02-07 MED ORDER — LIDOCAINE HCL (PF) 1 % IJ SOLN
INTRAMUSCULAR | Status: AC | PRN
  Administered 2021-02-07: 6 mL

## 2021-02-07 MED ORDER — MIDAZOLAM HCL 2 MG/2ML IJ SOLN
INTRAMUSCULAR | Status: AC
  Filled 2021-02-07: qty 4

## 2021-02-07 MED ORDER — GELATIN ABSORBABLE 12-7 MM EX MISC
CUTANEOUS | Status: AC
  Filled 2021-02-07: qty 1

## 2021-02-07 MED ORDER — FUROSEMIDE 10 MG/ML IJ SOLN
100.0000 mg | Freq: Once | INTRAVENOUS | Status: AC
  Administered 2021-02-07: 100 mg via INTRAVENOUS
  Filled 2021-02-07: qty 10

## 2021-02-07 MED ORDER — SODIUM CHLORIDE 0.9 % IV SOLN
INTRAVENOUS | Status: AC | PRN
  Administered 2021-02-07: 250 mL via INTRAVENOUS

## 2021-02-07 MED ORDER — FENTANYL CITRATE (PF) 100 MCG/2ML IJ SOLN
INTRAMUSCULAR | Status: AC | PRN
  Administered 2021-02-07: 25 ug via INTRAVENOUS
  Administered 2021-02-07: 50 ug via INTRAVENOUS

## 2021-02-07 NOTE — Procedures (Signed)
Interventional Radiology Procedure Note  Procedure: [place] left renal core bx    Complications: None  Estimated Blood Loss:  trace  Findings: 16 g core x 2    M. Ruel Favors, MD

## 2021-02-07 NOTE — Progress Notes (Signed)
[facility]  PROGRESS NOTE  Evan Vargas XBW:[id number] DOB: 01-19-1981 DOA: 01/31/2021 PCP: Pcp, No   Brief HPI:   40 year old male with history of paroxysmal atrial fibrillation, not on chronic anticoagulation, chronic diastolic heart failure who was admitted to hospital on 01/31/2021 with acute kidney injury.  Patient presented to [facility] long ED with complaints of profound nausea, vomiting and diarrhea without any signs and symptoms of bleeding.  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  He cut down fluids while taking protein supplements.  He was found to be in acute kidney injury, nephrology was consulted patient was transferred to [facility] for dialysis due to significantly elevated creatinine and potassium.  Patient was started on dialysis, kidney function continued to improve but not back to baseline.  Nephrology following.  Planning to start outpatient hemodialysis.    Subjective   Patient seen and examined, denies chest pain or shortness of breath.  Denies pain, underwent renal biopsy today.   Assessment/Plan:     1. Acute kidney injury-patient developed nausea and vomiting after eating burger, after he had bodybuilding competition.  He was taking Expel, OTC diuretic prior to competition.  Also takes creatine, protein, glutamine, salt substitute.  No significant NSAID exposure.  Nephrology was consulted.  CT scan showed no hydronephrosis or renal mass.  Pending ADA MTS 13 activity.  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  Patient Is Status Post Right IJ DDS Placed by IR on 4/21 and Started on Hemodialysis.  Patient Underwent Renal Biopsy This Morning.  Nephrology Is Planning to Make Arrangement for Hemodialysis As Outpatient. 2. Hyperkalemia-Secondary to above, Resolved. 3. Elevated LFTs-patient had significantly elevated LFTs, likely from hypovolemia resulting in shock liver.  Hepatitis  panel is negative, HIV negative.  No history of Tylenol use.  Patient was taking supplements including creatine, glutamine, protein, Expel.  This could also have been culprits and causing the transaminitis.  Right upper quadrant ultrasound showed diffuse abdominal ascites, right pleural effusion, fatty infiltration of liver.  LFTs are slowly improving.  Will need GI follow-up as outpatient. 4. Right pleural effusion/ascites-likely from volume overload due to acute kidney injury. 5. Thrombocytopenia-slowly improving, unclear etiology. 6. Anion gap metabolic acidosis-resolving 7. Elevated CPK-very minimally elevated, now improving 8. Paroxysmal atrial fibrillation-patient has history of paroxysmal atrial fibrillation.  Currently sinus rhythm.  Not on anticoagulation. CHA2DS2VASc score of 1.   Scheduled medications:   . calcium acetate  1,334 mg Oral TID WC  . [START ON 02/08/2021] Chlorhexidine Gluconate Cloth  6 each Topical Q0600  . darbepoetin (ARANESP) injection - DIALYSIS  100 mcg Intravenous Q Tue-HD         Data Reviewed:   CBG:  No results for input(s): GLUCAP in the last 168 hours.  SpO2: 100 % O2 Flow Rate (L/min): 2 L/min    Vitals:   02/07/21 1056 02/07/21 1111 02/07/21 1153 02/07/21 1559  BP: 137/75 137/82 132/87 138/72  Pulse: (!) 47 (!) 49 (!) 57 (!) 53  Resp:   16 16  Temp:   98.6 F (37 C) 98.4 F (36.9 C)  TempSrc:   Oral Oral  SpO2: 98% 99% 100% 100%  Weight:      Height:         Intake/Output Summary (Last 24 hours) at 02/07/2021 1748 Last data filed at 02/07/2021 1700 Gross per 24 hour  Intake 772.75 ml  Output 600 ml  Net 172.75 ml    04/25  1901 - 04/27 0700 In: 846 [P.O.:846] Out: 200 [Urine:200]  Filed Weights   02/06/21 1239 02/06/21 1653 02/07/21 0355  Weight: 93 kg 93 kg 93 kg    CBC:  Recent Labs  Lab 02/04/21 0118 02/05/21 0309 02/05/21 1549 02/06/21 0303 02/07/21 0428  WBC 5.4 4.8  4.7 4.6 4.7 6.5  HGB 10.6* 9.8*  9.8*  9.8* 9.2* 10.5*  HCT 30.9* 29.3*  29.1* 29.2* 27.3* 31.9*  PLT 83* 101*  94* 117* 119* 145*  MCV 90.9 93.9  93.9 93.9 93.2 96.1  MCH 31.2 31.4  31.6 31.5 31.4 31.6  MCHC 34.3 33.4  33.7 33.6 33.7 32.9  RDW 17.6* 17.5*  17.2* 17.2* 16.4* 16.5*  LYMPHSABS  --  0.8 0.7 1.1  --   MONOABS  --  0.9 0.9 0.8  --   EOSABS  --  0.1 0.1 0.1  --   BASOSABS  --  0.0 0.0 0.0  --     Complete metabolic panel:  Recent Labs  Lab 01/31/21 1912 01/31/21 1928 01/31/21 1928 02/01/21 0210 02/01/21 1610 02/03/21 0308 02/04/21 0118 02/05/21 0309 02/06/21 0303 02/07/21 0428  NA  --  137   < > 138  138  138   < > 136 136 134* 134* 135  K  --  7.4*   < > 6.3*  6.3*  6.2*   < > 5.8* 5.5* 4.9 4.3 4.1  CL  --  102   < > 103  102  102   < > 98 97* 96* 97* 98  CO2  --  14*   < > 16*  16*  18*   < > 23 27 22 28 26   GLUCOSE  --  111*   < > 112*  113*  114*   < > 93 128* 128* 102* 87  BUN  --  163*   < > 166*  170*  170*   < > 147* 107* 130* 66* 53*  CREATININE  --  13.71*   < > 14.06*  13.86*  14.18*   < > 15.13* 12.73* 14.65* 9.55* 8.25*  CALCIUM  --  7.3*   < > 7.2*  7.2*  7.1*   < > 7.3* 7.5* 7.4* 7.8* 7.9*  AST  --  1,188*   < > 847*   < > 254* 166* 120* 105* 93*  ALT  --  4,146*   < > 3,224*   < > 1,158* 788* 460* 339* 283*  ALKPHOS  --  121   < > 100   < > 78 72 69 75 72  BILITOT  --  9.1*   < > 8.6*   < > 2.4* 1.7* 1.7* 1.3* 1.4*  ALBUMIN  --  3.8   < > 3.1*   < > 2.6* 2.6* 2.6* 2.5* 2.7*  MG 3.0*  --   --  2.6*  --   --   --   --   --   --   INR 1.1  --   --  1.1  --   --   --   --   --   --   AMMONIA  --  32  --   --   --   --   --   --   --   --    < > = values in this interval not displayed.    No results for input(s): LIPASE, AMYLASE in the last 168 hours.  Recent Labs  Lab 01/31/21 2240  SARSCOV2NAA NEGATIVE    ------------------------------------------------------------------------------------------------------------------ No results for input(s): CHOL, HDL,  LDLCALC, TRIG, CHOLHDL, LDLDIRECT in the last 72 hours.  Lab Results  Component Value Date   HGBA1C 5.9 (H) 01/02/2018   ------------------------------------------------------------------------------------------------------------------ No results for input(s): TSH, T4TOTAL, T3FREE, THYROIDAB in the last 72 hours.  Invalid input(s): FREET3 ------------------------------------------------------------------------------------------------------------------ Recent Labs    02/05/21 0309 02/05/21 1006  VITAMINB12  --  7,335*  FOLATE  --  20.4  FERRITIN  --  685*  TIBC  --  221*  IRON  --  46  RETICCTPCT 0.8  --     Coagulation profile  Recent Labs  Lab 01/31/21 1912 02/01/21 0210  INR 1.1 1.1    No results for input(s): DDIMER in the last 72 hours.  Cardiac Enzymes  No results for input(s): CKMB, TROPONINI, MYOGLOBIN in the last 168 hours.  Invalid input(s): CK ------------------------------------------------------------------------------------------------------------------    Component Value Date/Time   BNP 1,059.7 (H) 01/02/2018 1527     Antibiotics: Anti-infectives (From admission, onward)   Start     Dose/Rate Route Frequency Ordered Stop   02/01/21 1505  ceFAZolin (ANCEF) 2-4 GM/100ML-% IVPB       Note to Pharmacy: Cristy Friedlanderusterman, Kyle   : cabinet override      02/01/21 1505 02/01/21 1516       Radiology Reports  US BIOPSY (KIDNEY)  Result Date: 02/07/2021 INDICATION: Acute kidney injury EXAM: ULTRASOUND RANDOM LEFT RENAL CORE BIOPSY MEDICATIONS: 1% lidocaine local ANESTHESIA/SEDATION: Moderate (conscious) sedation was employed during this procedure. A total of Versed 2.0 mg and Fentanyl 75 mcg was administered intravenously. Moderate Sedation Time: 14 minutes. The patient's level of consciousness and vital signs were monitored continuously by radiology nursing throughout the procedure under my direct supervision. FLUOROSCOPY TIME:  Fluoroscopy Time: None.  COMPLICATIONS: None immediate. PROCEDURE: Informed written consent was obtained from the patient after a thorough discussion of the procedural risks, benefits and alternatives. All questions were addressed. Maximal Sterile Barrier Technique was utilized including caps, mask, sterile gowns, sterile gloves, sterile drape, hand hygiene and skin antiseptic. A timeout was performed prior to the initiation of the procedure. Previous imaging reviewed. Preliminary ultrasound performed. The left kidney lower pole was localized and marked for biopsy. Kidneys are echogenic compatible with medical renal disease. Diffuse soft tissue edema noted as well as trace perinephric free fluid. Under sterile conditions and local anesthesia, a 15 gauge coaxial guide was advanced to the left kidney lower pole. Needle position confirmed with ultrasound. Images obtained for documentation. 2 16 gauge core biopsies obtained. Samples were intact and non fragmented. These were placed in saline. Needle tract occluded with Gel-Foam. Postprocedure imaging demonstrates no hemorrhage or hematoma. Patient tolerated biopsy well. IMPRESSION: Successful ultrasound left kidney 16 gauge core biopsy Electronically Signed   By: Judie PetitM.  Shick M.D.   On: 02/07/2021 10:06   US Abdomen Limited RUQ (LIVER/GB)  Result Date: 02/06/2021 CLINICAL DATA:  Elevated liver enzymes. EXAM: ULTRASOUND ABDOMEN LIMITED RIGHT UPPER QUADRANT COMPARISON:  CT 01/31/2021 FINDINGS: Gallbladder: No gallstones or wall thickening visualized. No sonographic Murphy sign noted by sonographer. Common bile duct: Diameter: 3.5 mm, normal Liver: Increased parenchymal echotexture suggesting fatty infiltration. No focal liver lesions. Portal vein is patent on color Doppler imaging with normal direction of blood flow towards the liver. Other: Diffuse abdominal ascites.  Right pleural effusion. IMPRESSION: Diffuse abdominal ascites. Right pleural effusion. Fatty infiltration of the liver. No  evidence of cholelithiasis or cholecystitis. Electronically  Signed   By: Burman Nieves M.D.   On: 02/06/2021 01:49      DVT prophylaxis: SCDs  Code Status: Full code  Family Communication: No family at bedside   Consultants:  Nephrology  Procedures:      Objective    Physical Examination:    General-appears in no acute distress  Heart-S1-S2, regular, no murmur auscultated  Lungs-clear to auscultation bilaterally, no wheezing or crackles auscultated  Abdomen-soft, nontender, no organomegaly  Extremities-no edema in the lower extremities  Neuro-alert, oriented x3, no focal deficit noted   Status is: Inpatient  Dispo: The patient is from: Home              Anticipated d/c is to: Home              Anticipated d/c date is: 02/10/2021              Patient currently not stable for discharge  Barrier to discharge-ongoing work-up for acute kidney injury  COVID-19 Labs  Recent Labs    02/05/21 1006 02/05/21 1018  FERRITIN 685*  --   LDH  --  321*    Lab Results  Component Value Date   SARSCOV2NAA NEGATIVE 01/31/2021    Microbiology  Recent Results (from the past 240 hour(s))  Resp Panel by RT-PCR (Flu A&B, Covid) Nasopharyngeal Swab     Status: None   Collection Time: 01/31/21 10:40 PM   Specimen: Nasopharyngeal Swab; Nasopharyngeal(NP) swabs in vial transport medium  Result Value Ref Range Status   SARS Coronavirus 2 by RT PCR NEGATIVE NEGATIVE Final    Comment: (NOTE) SARS-CoV-2 target nucleic acids are NOT DETECTED.  The SARS-CoV-2 RNA is generally detectable in upper respiratory specimens during the acute phase of infection. The lowest concentration of SARS-CoV-2 viral copies this assay can detect is 138 copies/mL. A negative result does not preclude SARS-Cov-2 infection and should not be used as the sole basis for treatment or other patient management decisions. A negative result may occur with  improper specimen collection/handling,  submission of specimen other than nasopharyngeal swab, presence of viral mutation(s) within the areas targeted by this assay, and inadequate number of viral copies(<138 copies/mL). A negative result must be combined with clinical observations, patient history, and epidemiological information. The expected result is Negative.  Fact Sheet for Patients:  BloggerCourse.com  Fact Sheet for Healthcare Providers:  SeriousBroker.it  This test is no t yet approved or cleared by the [place] FDA and  has been authorized for detection and/or diagnosis of SARS-CoV-2 by FDA under an Emergency Use Authorization (EUA). This EUA will remain  in effect (meaning this test can be used) for the duration of the COVID-19 declaration under Section 564(b)(1) of the Act, 21 U.S.C.section 360bbb-3(b)(1), unless the authorization is terminated  or revoked sooner.       Influenza A by PCR NEGATIVE NEGATIVE Final   Influenza B by PCR NEGATIVE NEGATIVE Final    Comment: (NOTE) The Xpert Xpress SARS-CoV-2/FLU/RSV plus assay is intended as an aid in the diagnosis of influenza from Nasopharyngeal swab specimens and should not be used as a sole basis for treatment. Nasal washings and aspirates are unacceptable for Xpert Xpress SARS-CoV-2/FLU/RSV testing.  Fact Sheet for Patients: BloggerCourse.com  Fact Sheet for Healthcare Providers: SeriousBroker.it  This test is not yet approved or cleared by the [place] FDA and has been authorized for detection and/or diagnosis of SARS-CoV-2 by FDA under an Emergency Use Authorization (EUA). This EUA will remain  in effect (meaning this test can be used) for the duration of the COVID-19 declaration under Section 564(b)(1) of the Act, 21 U.S.C. section 360bbb-3(b)(1), unless the authorization is terminated or revoked.  Performed at [facility], [address] [place], [place] [address]              Meredeth Ide   [facility] If 7PM-7AM, please contact night-coverage at [email or web address], Office  [phone]   02/07/2021, 5:48 PM  LOS: 7 days

## 2021-02-07 NOTE — Progress Notes (Signed)
Renal Navigator received request from Nephrologist/Dr. Ronalee Belts to refer patient for outpatient HD for AKI treatment. Navigator notes that patient is uninsured, listed as "Medicaid Potential." He will need active insurance (Medicaid or private) in order to be cleared to receive HD in the outpatient clinic. Renal Navigator submitted referral to [facility] to request a seat at clinic closest to patient's home with a seat to accommodate a new patient. Financial team to discuss with patient, however, Renal Navigator concerned that he will not be financially cleared to treat at this time.   Navigator will follow closely and follow up with patient when more information is available.   Barbee Shropshire, [place] Renal Navigator [phone]

## 2021-02-07 NOTE — Progress Notes (Addendum)
[facility] NEPHROLOGY PROGRESS NOTE  Assessment/ Plan: Pt is a 40 y.o. yo male who was presented on 4/20 with acute uremic symptoms for about 4 days.  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  Presents with creatinine level 14-15.  He reported that he was trying to self dehydrated for the computations and he was using oral creatinine supplements.  Denies use of NSAIDs.  #Acute kidney injury, oliguric and now dialysis dependent: Urinalysis with protein 100, some RBCs.  Exact etiology unknown ? ATN due to self dehydration (for body building competition),use of OTC diuretic pill Xpel concomitant with N/V/D and use of oral creatinine.  Now platelet count improved to 145 and peripheral smear without schistocytes therefore less likely TTP/HUS, pending ADAMTS 13 activity.  The serology including ANA, ANCA, complements, hep B, hep C, HIV negative, pending protein electrophoresis.  CT scan without hydronephrosis or renal mass.    Status post RIJ TDC placed by IR on 4/21 has received dialysis session.  Status post HD yesterday and tolerated well.  He underwent kidney biopsy this morning tolerated well.  Watch for renal recovery.  He will need outpatient HD arrangement for AKI, discussed with renal navigator.  Next HD tomorrow.  #Peripheral edema: I will order IV Lasix and oral metolazone today.  Plan for UF with HD tomorrow.  #Metabolic acidosis improved with dialysis.  #Hyperkalemia improved.  #Hyperphosphatemia: started PhosLo, PTH 38.    #Elevated liver enzymes, LFTs improving.  # Anemia:Iron saturation 21%.  Ordered IV iron and Aranesp. Monitor hemoglobin.  Subjective: Seen and examined.  Urine output recorded only 200 cc.  Underwent kidney biopsy.  Reports feeling bloating and noted some peripheral edema.  Denies nausea vomiting chest pain shortness of breath. Objective Vital signs in last 24 hours: Vitals:   02/07/21 1013 02/07/21 1041  02/07/21 1056 02/07/21 1111  BP: 127/70 (!) 146/68 137/75 137/82  Pulse: (!) 43 (!) 52 (!) 47 (!) 49  Resp: 14     Temp:      TempSrc:      SpO2: 98% 98% 98% 99%  Weight:      Height:       Weight change: 2.008 kg  Intake/Output Summary (Last 24 hours) at 02/07/2021 1145 Last data filed at 02/07/2021 0946 Gross per 24 hour  Intake 270 ml  Output 200 ml  Net 70 ml       Labs: Basic Metabolic Panel: Recent Labs  Lab 02/01/21 0210 02/01/21 0623 02/01/21 2254 02/02/21 0234 02/05/21 0309 02/06/21 0303 02/07/21 0428  NA 138  138  138   < > 136   < > 134* 134* 135  K 6.3*  6.3*  6.2*   < > 4.9   < > 4.9 4.3 4.1  CL 103  102  102   < > 99   < > 96* 97* 98  CO2 16*  16*  18*   < > 25   < > 22 28 26   GLUCOSE 112*  113*  114*   < > 134*   < > 128* 102* 87  BUN 166*  170*  170*   < > 145*   < > 130* 66* 53*  CREATININE 14.06*  13.86*  14.18*   < > 13.97*   < > 14.65* 9.55* 8.25*  CALCIUM 7.2*  7.2*  7.1*   < > 7.0*   < > 7.4* 7.8* 7.9*  PHOS 9.9*  9.8*  --  8.3*  --   --   --   --    < > =  values in this interval not displayed.   Liver Function Tests: Recent Labs  Lab 02/05/21 0309 02/06/21 0303 02/07/21 0428  AST 120* 105* 93*  ALT 460* 339* 283*  ALKPHOS 69 75 72  BILITOT 1.7* 1.3* 1.4*  PROT 4.8* 5.0* 5.3*  ALBUMIN 2.6* 2.5* 2.7*   Recent Labs  Lab 01/31/21 1647  LIPASE 30   Recent Labs  Lab 01/31/21 1928  AMMONIA 32   CBC: Recent Labs  Lab 02/04/21 0118 02/05/21 0309 02/05/21 1549 02/06/21 0303 02/07/21 0428  WBC 5.4 4.8  4.7 4.6 4.7 6.5  NEUTROABS  --  2.9 2.9 2.7  --   HGB 10.6* 9.8*  9.8* 9.8* 9.2* 10.5*  HCT 30.9* 29.3*  29.1* 29.2* 27.3* 31.9*  MCV 90.9 93.9  93.9 93.9 93.2 96.1  PLT 83* 101*  94* 117* 119* 145*   Cardiac Enzymes: Recent Labs  Lab 02/03/21 0308 02/04/21 0118 02/05/21 0309 02/06/21 0303 02/07/21 0428  CKTOTAL 220 163 147 128 119   CBG: No results for input(s): GLUCAP in the last 168  hours.  Iron Studies:  Recent Labs    02/05/21 1006  IRON 46  TIBC 221*  FERRITIN 685*   Studies/Results: US BIOPSY (KIDNEY)  Result Date: 02/07/2021 INDICATION: Acute kidney injury EXAM: ULTRASOUND RANDOM LEFT RENAL CORE BIOPSY MEDICATIONS: 1% lidocaine local ANESTHESIA/SEDATION: Moderate (conscious) sedation was employed during this procedure. A total of Versed 2.0 mg and Fentanyl 75 mcg was administered intravenously. Moderate Sedation Time: 14 minutes. The patient's level of consciousness and vital signs were monitored continuously by radiology nursing throughout the procedure under my direct supervision. FLUOROSCOPY TIME:  Fluoroscopy Time: None. COMPLICATIONS: None immediate. PROCEDURE: Informed written consent was obtained from the patient after a thorough discussion of the procedural risks, benefits and alternatives. All questions were addressed. Maximal Sterile Barrier Technique was utilized including caps, mask, sterile gowns, sterile gloves, sterile drape, hand hygiene and skin antiseptic. A timeout was performed prior to the initiation of the procedure. Previous imaging reviewed. Preliminary ultrasound performed. The left kidney lower pole was localized and marked for biopsy. Kidneys are echogenic compatible with medical renal disease. Diffuse soft tissue edema noted as well as trace perinephric free fluid. Under sterile conditions and local anesthesia, a 15 gauge coaxial guide was advanced to the left kidney lower pole. Needle position confirmed with ultrasound. Images obtained for documentation. 2 16 gauge core biopsies obtained. Samples were intact and non fragmented. These were placed in saline. Needle tract occluded with Gel-Foam. Postprocedure imaging demonstrates no hemorrhage or hematoma. Patient tolerated biopsy well. IMPRESSION: Successful ultrasound left kidney 16 gauge core biopsy Electronically Signed   By: Judie Petit.  Shick M.D.   On: 02/07/2021 10:06   US Abdomen Limited RUQ  (LIVER/GB)  Result Date: 02/06/2021 CLINICAL DATA:  Elevated liver enzymes. EXAM: ULTRASOUND ABDOMEN LIMITED RIGHT UPPER QUADRANT COMPARISON:  CT 01/31/2021 FINDINGS: Gallbladder: No gallstones or wall thickening visualized. No sonographic Murphy sign noted by sonographer. Common bile duct: Diameter: 3.5 mm, normal Liver: Increased parenchymal echotexture suggesting fatty infiltration. No focal liver lesions. Portal vein is patent on color Doppler imaging with normal direction of blood flow towards the liver. Other: Diffuse abdominal ascites.  Right pleural effusion. IMPRESSION: Diffuse abdominal ascites. Right pleural effusion. Fatty infiltration of the liver. No evidence of cholelithiasis or cholecystitis. Electronically Signed   By: Burman Nieves M.D.   On: 02/06/2021 01:49    Medications: Infusions: . ferric gluconate (FERRLECIT/NULECIT) IV 250 mg (02/07/21 1116)    Scheduled  Medications: . calcium acetate  1,334 mg Oral TID WC  . Chlorhexidine Gluconate Cloth  6 each Topical Q0600  . darbepoetin (ARANESP) injection - DIALYSIS  100 mcg Intravenous Q Tue-HD    have reviewed scheduled and prn medications.  Physical Exam: General:NAD, comfortable Heart:RRR, s1s2 nl, no rubs Lungs: Clear  b/l, no crackle Abdomen:soft, Non-tender, non-distended Extremities: Trace peripheral edema present. Dialysis Access: RIJ TDC  Saamir Armstrong Prasad Dario Yono 02/07/2021,11:45 AM  LOS: 7 days

## 2021-02-07 NOTE — Progress Notes (Signed)
Rounded on patient today in correlation to transition to outpatient HD. Patient was found sitting in the bed watching tv. Permission was granted to speak with patient at the bedside. 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 Ordered Kidney Failure book given to patient. Patient educated at the bedside regarding care of tunneled dialysis catheter and proper medication administration on HD days.  Patient also educated on the importance of adhering to scheduled dialysis treatments, the effects of fluid overload, hyperkalemia and hyperphosphatemia. Patient voices concern in regards to his dry weight because patient notes that this will change dramatically if he is preparing for a competition. Spoke with patient in depth about not becoming dehydrated and to ensure that he speaks with the nephrologist if plans to drop weight as this will affect his treatment. Patient capable of re-verbalizing via teach back method. Also educated patient on services available through the interdisciplinary team in the clinic setting and the differences they will note when transitioning to the outpatient setting from the hospital. Patient with no further questions at this time. Handouts and contact information provided to patient for any further assistance. Will follow as appropriate.   Dondra Spry, RN  Dialysis Nurse Coordinator Phone: [phone]

## 2021-02-08 LAB — COMPREHENSIVE METABOLIC PANEL
ALT: 214 U/L — ABNORMAL HIGH (ref 0–44)
AST: 84 U/L — ABNORMAL HIGH (ref 15–41)
Albumin: 2.4 g/dL — ABNORMAL LOW (ref 3.5–5.0)
Alkaline Phosphatase: 85 U/L (ref 38–126)
Anion gap: 13 (ref 5–15)
BUN: 75 mg/dL — ABNORMAL HIGH (ref 6–20)
CO2: 23 mmol/L (ref 22–32)
Calcium: 7.9 mg/dL — ABNORMAL LOW (ref 8.9–10.3)
Chloride: 101 mmol/L (ref 98–111)
Creatinine, Ser: 10.03 mg/dL — ABNORMAL HIGH (ref 0.61–1.24)
GFR, Estimated: 6 mL/min — ABNORMAL LOW (ref >60)
Glucose, Bld: 127 mg/dL — ABNORMAL HIGH (ref 70–99)
Potassium: 4.6 mmol/L (ref 3.5–5.1)
Sodium: 137 mmol/L (ref 135–145)
Total Bilirubin: 0.9 mg/dL (ref 0.3–1.2)
Total Protein: 4.9 g/dL — ABNORMAL LOW (ref 6.5–8.1)

## 2021-02-08 LAB — CBC
HCT: 27.3 % — ABNORMAL LOW (ref 39.0–52.0)
Hemoglobin: 8.9 g/dL — ABNORMAL LOW (ref 13.0–17.0)
MCH: 31.9 pg (ref 26.0–34.0)
MCHC: 32.6 g/dL (ref 30.0–36.0)
MCV: 97.8 fL (ref 80.0–100.0)
Platelets: 179 10*3/uL (ref 150–400)
RBC: 2.79 MIL/uL — ABNORMAL LOW (ref 4.22–5.81)
RDW: 16.6 % — ABNORMAL HIGH (ref 11.5–15.5)
WBC: 8 10*3/uL (ref 4.0–10.5)
nRBC: 0 % (ref 0.0–0.2)

## 2021-02-08 LAB — ADAMTS13 ACTIVITY REFLEX

## 2021-02-08 LAB — CK: Total CK: 656 U/L — ABNORMAL HIGH (ref 49–397)

## 2021-02-08 LAB — ADAMTS13 ACTIVITY: Adamts 13 Activity: 88.3 % (ref >66.8)

## 2021-02-08 MED ORDER — HEPARIN SODIUM (PORCINE) 1000 UNIT/ML IJ SOLN
INTRAMUSCULAR | Status: AC
  Administered 2021-02-08: 3200 [IU] via INTRAVENOUS
  Filled 2021-02-08: qty 4

## 2021-02-08 NOTE — Progress Notes (Signed)
[facility] NEPHROLOGY PROGRESS NOTE  Assessment/ Plan: Pt is a 40 y.o. yo male who was presented on 4/20 with acute uremic symptoms for about 4 days.  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  Presents with creatinine level 14-15.  He reported that he was trying to self dehydrated for the computations and he was using oral creatinine supplements.  Denies use of NSAIDs.  #Acute kidney injury, oliguric and now dialysis dependent: Urinalysis with protein 100, some RBCs.  Exact etiology unknown ? ATN due to self dehydration (for body building competition),use of OTC diuretic pill Xpel concomitant with N/V/D and use of oral creatinine.  Now platelet count improved to normal and peripheral smear without schistocytes therefore less likely TTP/HUS, pending ADAMTS 13 activity.  The serology including ANA, ANCA, complements, hep B, hep C, HIV negative, protein electrophoresis unremarkable.  CT scan without hydronephrosis or renal mass.    Status post RIJ TDC placed by IR on 4/21 and started dialysis.  He underwent kidney biopsy on 4/27, await bx result. He will need outpatient HD arrangement for AKI, discussed with renal navigator.   Urine output around 1 L with IV Lasix and metolazone however no renal recovery.  HD today with UF.  Tolerating HD well so far.  #Peripheral edema: Received diuretics yesterday and plan for ultrafiltration during HD today.  #Metabolic acidosis improved with dialysis.  #Hyperkalemia improved.  #Hyperphosphatemia: started PhosLo, PTH 38.    #Elevated liver enzymes, LFTs improving.  # Anemia:Iron saturation 21%.  Ordered IV iron and Aranesp. Monitor hemoglobin.  Subjective: Seen and examined.  Ordered IV Lasix and metolazone yesterday with around 1 L of urine output.  Both BUN and creatinine level going up.  Receiving dialysis today and tolerating well. Objective Vital signs in last 24 hours: Vitals:   02/08/21 0900  02/08/21 0930 02/08/21 1000 02/08/21 1030  BP: 135/81 133/79 (P) 128/76 132/76  Pulse: (!) 45 (!) 45 (P) 85 87  Resp: 18  18 18   Temp:      TempSrc:      SpO2:      Weight:      Height:       Weight change: 1.53 kg  Intake/Output Summary (Last 24 hours) at 02/08/2021 1109 Last data filed at 02/08/2021 0500 Gross per 24 hour  Intake 502.75 ml  Output 1000 ml  Net -497.25 ml       Labs: Basic Metabolic Panel: Recent Labs  Lab 02/01/21 2254 02/02/21 0234 02/06/21 0303 02/07/21 0428 02/08/21 0248  NA 136   < > 134* 135 137  K 4.9   < > 4.3 4.1 4.6  CL 99   < > 97* 98 101  CO2 25   < > 28 26 23   GLUCOSE 134*   < > 102* 87 127*  BUN 145*   < > 66* 53* 75*  CREATININE 13.97*   < > 9.55* 8.25* 10.03*  CALCIUM 7.0*   < > 7.8* 7.9* 7.9*  PHOS 8.3*  --   --   --   --    < > = values in this interval not displayed.   Liver Function Tests: Recent Labs  Lab 02/06/21 0303 02/07/21 0428 02/08/21 0248  AST 105* 93* 84*  ALT 339* 283* 214*  ALKPHOS 75 72 85  BILITOT 1.3* 1.4* 0.9  PROT 5.0* 5.3* 4.9*  ALBUMIN 2.5* 2.7* 2.4*   No results for input(s): LIPASE, AMYLASE in the last 168 hours.  No results for input(s): AMMONIA in the last 168 hours. CBC: Recent Labs  Lab 02/05/21 0309 02/05/21 1549 02/06/21 0303 02/07/21 0428 02/08/21 0248  WBC 4.8  4.7 4.6 4.7 6.5 8.0  NEUTROABS 2.9 2.9 2.7  --   --   HGB 9.8*  9.8* 9.8* 9.2* 10.5* 8.9*  HCT 29.3*  29.1* 29.2* 27.3* 31.9* 27.3*  MCV 93.9  93.9 93.9 93.2 96.1 97.8  PLT 101*  94* 117* 119* 145* 179   Cardiac Enzymes: Recent Labs  Lab 02/04/21 0118 02/05/21 0309 02/06/21 0303 02/07/21 0428 02/08/21 0248  CKTOTAL 163 147 128 119 656*   CBG: No results for input(s): GLUCAP in the last 168 hours.  Iron Studies:  No results for input(s): IRON, TIBC, TRANSFERRIN, FERRITIN in the last 72 hours. Studies/Results: US BIOPSY (KIDNEY)  Result Date: 02/07/2021 INDICATION: Acute kidney injury EXAM: ULTRASOUND  RANDOM LEFT RENAL CORE BIOPSY MEDICATIONS: 1% lidocaine local ANESTHESIA/SEDATION: Moderate (conscious) sedation was employed during this procedure. A total of Versed 2.0 mg and Fentanyl 75 mcg was administered intravenously. Moderate Sedation Time: 14 minutes. The patient's level of consciousness and vital signs were monitored continuously by radiology nursing throughout the procedure under my direct supervision. FLUOROSCOPY TIME:  Fluoroscopy Time: None. COMPLICATIONS: None immediate. PROCEDURE: Informed written consent was obtained from the patient after a thorough discussion of the procedural risks, benefits and alternatives. All questions were addressed. Maximal Sterile Barrier Technique was utilized including caps, mask, sterile gowns, sterile gloves, sterile drape, hand hygiene and skin antiseptic. A timeout was performed prior to the initiation of the procedure. Previous imaging reviewed. Preliminary ultrasound performed. The left kidney lower pole was localized and marked for biopsy. Kidneys are echogenic compatible with medical renal disease. Diffuse soft tissue edema noted as well as trace perinephric free fluid. Under sterile conditions and local anesthesia, a 15 gauge coaxial guide was advanced to the left kidney lower pole. Needle position confirmed with ultrasound. Images obtained for documentation. 2 16 gauge core biopsies obtained. Samples were intact and non fragmented. These were placed in saline. Needle tract occluded with Gel-Foam. Postprocedure imaging demonstrates no hemorrhage or hematoma. Patient tolerated biopsy well. IMPRESSION: Successful ultrasound left kidney 16 gauge core biopsy Electronically Signed   By: Judie Petit.  Shick M.D.   On: 02/07/2021 10:06    Medications: Infusions:   Scheduled Medications: . heparin sodium (porcine)      . calcium acetate  1,334 mg Oral TID WC  . Chlorhexidine Gluconate Cloth  6 each Topical Q0600  . darbepoetin (ARANESP) injection - DIALYSIS  100 mcg  Intravenous Q Tue-HD    have reviewed scheduled and prn medications.  Physical Exam: General:NAD, comfortable Heart:RRR, s1s2 nl, no rubs Lungs: Clear b/l, no crackle Abdomen:soft, Non-tender, non-distended Extremities: Trace peripheral edema present. Dialysis Access: RIJ TDC  Valla Pacey Jaynie Collins 02/08/2021,11:09 AM  LOS: 8 days

## 2021-02-08 NOTE — Progress Notes (Signed)
[facility]  PROGRESS NOTE  Evan Vargas ZOX:[id number] DOB: 1980/10/18 DOA: 01/31/2021 PCP: Pcp, No   Brief HPI:   40 year old male with history of paroxysmal atrial fibrillation, not on chronic anticoagulation, chronic diastolic heart failure who was admitted to hospital on 01/31/2021 with acute kidney injury.  Patient [facility] ED with complaints of profound nausea, vomiting and diarrhea without any signs and symptoms of bleeding.  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  He cut down fluids while taking protein supplements.  He was found to be in acute kidney injury, nephrology was consulted patient was transferred to [facility] for dialysis due to significantly elevated creatinine and potassium.  Patient was started on dialysis, kidney function continued to improve but not back to baseline.  Nephrology following.  Planning to start outpatient hemodialysis.    Subjective   Patient seen and examined, denies chest pain or shortness of breath.  Denies pain, underwent renal biopsy today.   Assessment/Plan:     1. Acute kidney injury-patient developed nausea and vomiting after eating burger, after he had bodybuilding competition.  He was taking Expel, OTC diuretic prior to competition.  Also takes creatine, protein, glutamine, salt substitute.  No significant NSAID exposure.  Patient was found to be in acute kidney injury with creatinine of 13.87.  Nephrology was consulted.  CT scan showed no hydronephrosis or renal mass.  Pending ADA MTS 13 activity.  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  Patient Is Status Post Right IJ DDS Placed by IR on 4/21 and Started on Hemodialysis.  Patient Underwent Renal Biopsy on 02/07/2021.  Biopsy result is currently pending.  Nephrology Is Planning to make arrangement for Hemodialysis As Outpatient. 2. Hyperkalemia-Secondary to above,  Resolved. 3. Elevated LFTs-patient had significantly elevated LFTs, likely from hypovolemia resulting in shock liver.  Hepatitis panel is negative, HIV negative.  No history of Tylenol use.  Patient was taking supplements including creatine, glutamine, protein, Expel.  This could also have been culprits and causing the transaminitis.  Right upper quadrant ultrasound showed diffuse abdominal ascites, right pleural effusion, fatty infiltration of liver.  LFTs are slowly improving.  He might need GI follow-up as outpatient. 4. Right pleural effusion/ascites-likely from volume overload due to acute kidney injury. 5. Thrombocytopenia-slowly improving, unclear etiology. 6. Anion gap metabolic acidosis-resolving 7. Elevated CPK-very minimally elevated, now improving 8. Paroxysmal atrial fibrillation-patient has history of paroxysmal atrial fibrillation.  Currently sinus rhythm.  Not on anticoagulation. CHA2DS2VASc score of 1.   Scheduled medications:   . calcium acetate  1,334 mg Oral TID WC  . Chlorhexidine Gluconate Cloth  6 each Topical Q0600  . darbepoetin (ARANESP) injection - DIALYSIS  100 mcg Intravenous Q Tue-HD         Data Reviewed:   CBG:  No results for input(s): GLUCAP in the last 168 hours.  SpO2: 97 % O2 Flow Rate (L/min): 2 L/min    Vitals:   02/08/21 1030 02/08/21 1100 02/08/21 1200 02/08/21 1715  BP: 132/76 136/75 119/73 124/76  Pulse: 87 86  (!) 110  Resp: 18 18  19   Temp:   99.3 F (37.4 C) 98.8 F (37.1 C)  TempSrc:   Oral Oral  SpO2:  99% 98% 97%  Weight:  92.8 kg    Height:         Intake/Output Summary (Last 24 hours) at 02/08/2021 1759 Last data filed at 02/08/2021 1100 Gross per 24 hour  Intake --  Output 3106 ml  Net -3106 ml    04/26 1901 - 04/28 0700 In: 772.8 [P.O.:360; I.V.:150] Out: 1200 [Urine:1200]  Filed Weights   02/08/21 0456 02/08/21 0656 02/08/21 1100  Weight: 94.5 kg 94.2 kg 92.8 kg    CBC:  Recent Labs  Lab  02/05/21 0309 02/05/21 1549 02/06/21 0303 02/07/21 0428 02/08/21 0248  WBC 4.8  4.7 4.6 4.7 6.5 8.0  HGB 9.8*  9.8* 9.8* 9.2* 10.5* 8.9*  HCT 29.3*  29.1* 29.2* 27.3* 31.9* 27.3*  PLT 101*  94* 117* 119* 145* 179  MCV 93.9  93.9 93.9 93.2 96.1 97.8  MCH 31.4  31.6 31.5 31.4 31.6 31.9  MCHC 33.4  33.7 33.6 33.7 32.9 32.6  RDW 17.5*  17.2* 17.2* 16.4* 16.5* 16.6*  LYMPHSABS 0.8 0.7 1.1  --   --   MONOABS 0.9 0.9 0.8  --   --   EOSABS 0.1 0.1 0.1  --   --   BASOSABS 0.0 0.0 0.0  --   --     Complete metabolic panel:  Recent Labs  Lab 02/04/21 0118 02/05/21 0309 02/06/21 0303 02/07/21 0428 02/08/21 0248  NA 136 134* 134* 135 137  K 5.5* 4.9 4.3 4.1 4.6  CL 97* 96* 97* 98 101  CO2 27 22 28 26 23   GLUCOSE 128* 128* 102* 87 127*  BUN 107* 130* 66* 53* 75*  CREATININE 12.73* 14.65* 9.55* 8.25* 10.03*  CALCIUM 7.5* 7.4* 7.8* 7.9* 7.9*  AST 166* 120* 105* 93* 84*  ALT 788* 460* 339* 283* 214*  ALKPHOS 72 69 75 72 85  BILITOT 1.7* 1.7* 1.3* 1.4* 0.9  ALBUMIN 2.6* 2.6* 2.5* 2.7* 2.4*    No results for input(s): LIPASE, AMYLASE in the last 168 hours.  No results for input(s): CRP, DDIMER, BNP, PROCALCITON, SARSCOV2NAA in the last 168 hours.  Invalid input(s): LACTICACID  ------------------------------------------------------------------------------------------------------------------ No results for input(s): CHOL, HDL, LDLCALC, TRIG, CHOLHDL, LDLDIRECT in the last 72 hours.  Lab Results  Component Value Date   HGBA1C 5.9 (H) 01/02/2018   ------------------------------------------------------------------------------------------------------------------ No results for input(s): TSH, T4TOTAL, T3FREE, THYROIDAB in the last 72 hours.  Invalid input(s): FREET3 ------------------------------------------------------------------------------------------------------------------ No results for input(s): VITAMINB12, FOLATE, FERRITIN, TIBC, IRON, RETICCTPCT in the last 72  hours.  Coagulation profile  No results for input(s): INR, PROTIME in the last 168 hours.  No results for input(s): DDIMER in the last 72 hours.  Cardiac Enzymes  No results for input(s): CKMB, TROPONINI, MYOGLOBIN in the last 168 hours.  Invalid input(s): CK ------------------------------------------------------------------------------------------------------------------    Component Value Date/Time   BNP 1,059.7 (H) 01/02/2018 1527     Antibiotics: Anti-infectives (From admission, onward)   Start     Dose/Rate Route Frequency Ordered Stop   02/01/21 1505  ceFAZolin (ANCEF) 2-4 GM/100ML-% IVPB       Note to Pharmacy: 02/03/21   : cabinet override      02/01/21 1505 02/01/21 1516       Radiology Reports  02/03/21 BIOPSY (KIDNEY)  Result Date: 02/07/2021 INDICATION: Acute kidney injury EXAM: ULTRASOUND RANDOM LEFT RENAL CORE BIOPSY MEDICATIONS: 1% lidocaine local ANESTHESIA/SEDATION: Moderate (conscious) sedation was employed during this procedure. A total of Versed 2.0 mg and Fentanyl 75 mcg was administered intravenously. Moderate Sedation Time: 14 minutes. The patient's level of consciousness and vital signs were monitored continuously by radiology nursing throughout the procedure under my direct supervision. FLUOROSCOPY TIME:  Fluoroscopy Time: None. COMPLICATIONS: None immediate. PROCEDURE: Informed written consent  was obtained from the patient after a thorough discussion of the procedural risks, benefits and alternatives. All questions were addressed. Maximal Sterile Barrier Technique was utilized including caps, mask, sterile gowns, sterile gloves, sterile drape, hand hygiene and skin antiseptic. A timeout was performed prior to the initiation of the procedure. Previous imaging reviewed. Preliminary ultrasound performed. The left kidney lower pole was localized and marked for biopsy. Kidneys are echogenic compatible with medical renal disease. Diffuse soft tissue edema noted  as well as trace perinephric free fluid. Under sterile conditions and local anesthesia, a 15 gauge coaxial guide was advanced to the left kidney lower pole. Needle position confirmed with ultrasound. Images obtained for documentation. 2 16 gauge core biopsies obtained. Samples were intact and non fragmented. These were placed in saline. Needle tract occluded with Gel-Foam. Postprocedure imaging demonstrates no hemorrhage or hematoma. Patient tolerated biopsy well. IMPRESSION: Successful ultrasound left kidney 16 gauge core biopsy Electronically Signed   By: Judie Petit.  Shick M.D.   On: 02/07/2021 10:06      DVT prophylaxis: SCDs  Code Status: Full code  Family Communication: No family at bedside   Consultants:  Nephrology  Procedures:      Objective    Physical Examination:    General-appears in no acute distress  Heart-S1-S2, regular, no murmur auscultated  Lungs-clear to auscultation bilaterally, no wheezing or crackles auscultated  Abdomen-soft, nontender, no organomegaly  Extremities-no edema in the lower extremities  Neuro-alert, oriented x3, no focal deficit noted   Status is: Inpatient  Dispo: The patient is from: Home              Anticipated d/c is to: Home              Anticipated d/c date is: 02/10/2021              Patient currently not stable for discharge  Barrier to discharge-ongoing work-up for acute kidney injury  COVID-19 Labs  No results for input(s): DDIMER, FERRITIN, LDH, CRP in the last 72 hours.  Lab Results  Component Value Date   SARSCOV2NAA NEGATIVE 01/31/2021    Microbiology  Recent Results (from the past 240 hour(s))  Resp Panel by RT-PCR (Flu A&B, Covid) Nasopharyngeal Swab     Status: None   Collection Time: 01/31/21 10:40 PM   Specimen: Nasopharyngeal Swab; Nasopharyngeal(NP) swabs in vial transport medium  Result Value Ref Range Status   SARS Coronavirus 2 by RT PCR NEGATIVE NEGATIVE Final    Comment: (NOTE) SARS-CoV-2 target  nucleic acids are NOT DETECTED.  The SARS-CoV-2 RNA is generally detectable in upper respiratory specimens during the acute phase of infection. The lowest concentration of SARS-CoV-2 viral copies this assay can detect is 138 copies/mL. A negative result does not preclude SARS-Cov-2 infection and should not be used as the sole basis for treatment or other patient management decisions. A negative result may occur with  improper specimen collection/handling, submission of specimen other than nasopharyngeal swab, presence of viral mutation(s) within the areas targeted by this assay, and inadequate number of viral copies(<138 copies/mL). A negative result must be combined with clinical observations, patient history, and epidemiological information. The expected result is Negative.  Fact Sheet for Patients:  BloggerCourse.com  Fact Sheet for Healthcare Providers:  SeriousBroker.it  This test is no t yet approved or cleared by the [place] FDA and  has been authorized for detection and/or diagnosis of SARS-CoV-2 by FDA under an Emergency Use Authorization (EUA). This EUA will remain  in  effect (meaning this test can be used) for the duration of the COVID-19 declaration under Section 564(b)(1) of the Act, 21 U.S.C.section 360bbb-3(b)(1), unless the authorization is terminated  or revoked sooner.       Influenza A by PCR NEGATIVE NEGATIVE Final   Influenza B by PCR NEGATIVE NEGATIVE Final    Comment: (NOTE) The Xpert Xpress SARS-CoV-2/FLU/RSV plus assay is intended as an aid in the diagnosis of influenza from Nasopharyngeal swab specimens and should not be used as a sole basis for treatment. Nasal washings and aspirates are unacceptable for Xpert Xpress SARS-CoV-2/FLU/RSV testing.  Fact Sheet for Patients: BloggerCourse.com  Fact Sheet for Healthcare  Providers: SeriousBroker.it  This test is not yet approved or cleared by the [place] FDA and has been authorized for detection and/or diagnosis of SARS-CoV-2 by FDA under an Emergency Use Authorization (EUA). This EUA will remain in effect (meaning this test can be used) for the duration of the COVID-19 declaration under Section 564(b)(1) of the Act, 21 U.S.C. section 360bbb-3(b)(1), unless the authorization is terminated or revoked.  Performed at [facility], [address], [place], [place] [address]              Meredeth Ide   [facility] If 7PM-7AM, please contact night-coverage at [email or web address], Office  [phone]   02/08/2021, 5:59 PM  LOS: 8 days

## 2021-02-09 LAB — COMPREHENSIVE METABOLIC PANEL
ALT: 181 U/L — ABNORMAL HIGH (ref 0–44)
AST: 59 U/L — ABNORMAL HIGH (ref 15–41)
Albumin: 2.6 g/dL — ABNORMAL LOW (ref 3.5–5.0)
Alkaline Phosphatase: 71 U/L (ref 38–126)
Anion gap: 10 (ref 5–15)
BUN: 61 mg/dL — ABNORMAL HIGH (ref 6–20)
CO2: 27 mmol/L (ref 22–32)
Calcium: 8.5 mg/dL — ABNORMAL LOW (ref 8.9–10.3)
Chloride: 101 mmol/L (ref 98–111)
Creatinine, Ser: 8.27 mg/dL — ABNORMAL HIGH (ref 0.61–1.24)
GFR, Estimated: 8 mL/min — ABNORMAL LOW (ref >60)
Glucose, Bld: 92 mg/dL (ref 70–99)
Potassium: 4.5 mmol/L (ref 3.5–5.1)
Sodium: 138 mmol/L (ref 135–145)
Total Bilirubin: 1.1 mg/dL (ref 0.3–1.2)
Total Protein: 5.3 g/dL — ABNORMAL LOW (ref 6.5–8.1)

## 2021-02-09 LAB — CBC
HCT: 27.6 % — ABNORMAL LOW (ref 39.0–52.0)
Hemoglobin: 9.1 g/dL — ABNORMAL LOW (ref 13.0–17.0)
MCH: 32 pg (ref 26.0–34.0)
MCHC: 33 g/dL (ref 30.0–36.0)
MCV: 97.2 fL (ref 80.0–100.0)
Platelets: 238 10*3/uL (ref 150–400)
RBC: 2.84 MIL/uL — ABNORMAL LOW (ref 4.22–5.81)
RDW: 16.2 % — ABNORMAL HIGH (ref 11.5–15.5)
WBC: 8.7 10*3/uL (ref 4.0–10.5)
nRBC: 0 % (ref 0.0–0.2)

## 2021-02-09 MED ORDER — CHLORHEXIDINE GLUCONATE CLOTH 2 % EX PADS
6.0000 | MEDICATED_PAD | Freq: Every day | CUTANEOUS | Status: DC
  Administered 2021-02-12: 6 via TOPICAL

## 2021-02-09 NOTE — Progress Notes (Signed)
PROGRESS NOTE   Evan Vargas  FEO:[id number]    DOB: May 07, 1981    DOA: 01/31/2021  PCP: Pcp, No   I have briefly reviewed patients previous medical records in [facility].    Brief Narrative:  40 year old male with medical history significant for but not limited to paroxysmal atrial fibrillation, not on chronic anticoagulation, chronic diastolic CHF who was admitted to the hospital on 01/31/2021 with acute kidney injury.  [facility] with complaints of profound nausea, vomiting and diarrhea.  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  Admitted for acute kidney injury, nephrology was consulted, patient was transferred to [facility], initiated on hemodialysis due to acute renal failure with uremia and hyperkalemia.   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  Presented with creatinine of 13.87.  No thrombocytopenia and peripheral smear without schistocytes, ADAMTS 13 pending, less likely TTP/HUS.  ANA, ANCA, complements, hepatitis B, hepatitis C, HIV negative and protein electrophoresis unremarkable.  CT abdomen without hydronephrosis or renal mass.  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  Nephrology consulted and assisting with evaluation and management.  S/p RIJ TDC placed by IR on 4/21  then started HD.  Last HD on 4/28 with plans to possibly dialyze on 4/30 as per my discussion with Dr. Ronalee Belts.  Having urine output, 800 mL documented yesterday but not sure if this is complete.  Monitor for renal recovery.  As per renal navigator, patient lacks insurance, will not be able to get outpatient HD if needed unless he gets insurance, or advances to ESRD.  Volume management across HD.  Hyperkalemia due to acute kidney injury Resolved  Anion gap metabolic acidosis due to acute kidney injury Resolved  Hyperphosphatemia Per nephrology.  PTH 38.  They have started PhosLo.  Rhabdomyolysis Possibly related to bodybuilding and other supplements as noted above.  CK had come down but back up again in the 600 range.  Follow CK in AM.  Elevated LFTs Hepatitis panel and HIV negative.  No history of acetaminophen use.  He was taking other supplements including creatinine, glutamine, protein and expel which could have been culprits.  RUQ ultrasound showed diffuse abdominal ascites, right pleural effusion and fatty infiltration of the liver.  May be multifactorial due to shock liver, rhabdomyolysis (CK5 71 on 4/20 which had come down to 119 and is back up again at 656 on 4/28).  Continue to gradually improve.  Some of it could also be due to passive congestion.  Consider outpatient GI consultation and follow-up.  Acute anemia May have baseline anemia.  Hemoglobin in 2019 was 11.6.  Now stable in the 9 g range.  Unclear etiology.  Thrombocytopenia Possibly due to rhabdomyolysis, AKI.  Resolved.  Paroxysmal atrial fibrillation Remains in sinus rhythm.  CHA2DS2-VASc score 1.  Body mass index is 31.14 kg/m.    DVT prophylaxis: SCDs Start:  01/31/21 2138     Code Status: Full Code Family Communication: None at bedside. Disposition:  Status is: Inpatient  Remains inpatient appropriate because:Inpatient level of care appropriate due to severity of illness   Dispo: The patient is from:  Home              Anticipated d/c is to: Home              Patient currently is not medically stable to d/c.   Difficult to place patient No        Consultants:   Nephrology IR  Procedures:   Renal biopsy RIJ TDC by IR on 4/21 Hemodialysis  Antimicrobials:    Anti-infectives (From admission, onward)   Start     Dose/Rate Route Frequency Ordered Stop   02/01/21 1505  ceFAZolin (ANCEF) 2-4 GM/100ML-% IVPB       Note to Pharmacy: Cristy Friedlander   : cabinet override      02/01/21 1505 02/01/21 1516        Subjective:  Feels better compared to yesterday.  Decreased swelling of his legs.  Some LLQ abdominal pain on coughing only.  Tolerating diet.  Reports passing urine well.  Denies pain.  Objective:   Vitals:   02/09/21 0535 02/09/21 0620 02/09/21 0817 02/09/21 1158  BP: 125/68  133/72 125/70  Pulse: 68  64 60  Resp: 17  16 16   Temp: 98.8 F (37.1 C)  98.9 F (37.2 C) 98.6 F (37 C)  TempSrc: Oral  Axillary Oral  SpO2: 99%     Weight:  92.9 kg    Height:        General exam: Young male, well-built and nourished lying comfortably propped up in bed without distress.  Oral mucosa moist. Respiratory system: Clear to auscultation. Respiratory effort normal. Cardiovascular system: S1 & S2 heard, RRR. No JVD, murmurs, rubs, gallops or clicks.  All extremities pitting edema, lower extremities 2+, upper extremities trace.  Telemetry personally reviewed: Sinus rhythm. Gastrointestinal system: Abdomen is nondistended, soft and nontender. No organomegaly or masses felt. Normal bowel sounds heard. Central nervous system: Alert and oriented. No focal neurological deficits. Extremities: Symmetric 5 x 5 power. Skin: No rashes, lesions or ulcers Psychiatry: Judgement and insight appear normal. Mood & affect appropriate.     Data Reviewed:   I have personally reviewed following labs and imaging studies   CBC: Recent Labs  Lab 02/05/21 0309 02/05/21 1549 02/06/21 0303  02/07/21 0428 02/08/21 0248 02/09/21 0332  WBC 4.8  4.7 4.6 4.7 6.5 8.0 8.7  NEUTROABS 2.9 2.9 2.7  --   --   --   HGB 9.8*  9.8* 9.8* 9.2* 10.5* 8.9* 9.1*  HCT 29.3*  29.1* 29.2* 27.3* 31.9* 27.3* 27.6*  MCV 93.9  93.9 93.9 93.2 96.1 97.8 97.2  PLT 101*  94* 117* 119* 145* 179 238    Basic Metabolic Panel: Recent Labs  Lab 02/07/21 0428 02/08/21 0248 02/09/21 0332  NA 135 137 138  K 4.1 4.6 4.5  CL 98 101 101  CO2 26 23 27   GLUCOSE 87 127* 92  BUN 53* 75* 61*  CREATININE 8.25* 10.03* 8.27*  CALCIUM 7.9* 7.9* 8.5*    Liver Function Tests: Recent Labs  Lab 02/07/21 0428 02/08/21 0248 02/09/21 0332  AST 93* 84* 59*  ALT 283* 214* 181*  ALKPHOS 72 85 71  BILITOT 1.4* 0.9 1.1  PROT 5.3* 4.9* 5.3*  ALBUMIN 2.7* 2.4* 2.6*    CBG: No  results for input(s): GLUCAP in the last 168 hours.  Microbiology Studies:   Recent Results (from the past 240 hour(s))  Resp Panel by RT-PCR (Flu A&B, Covid) Nasopharyngeal Swab     Status: None   Collection Time: 01/31/21 10:40 PM   Specimen: Nasopharyngeal Swab; Nasopharyngeal(NP) swabs in vial transport medium  Result Value Ref Range Status   SARS Coronavirus 2 by RT PCR NEGATIVE NEGATIVE Final    Comment: (NOTE) SARS-CoV-2 target nucleic acids are NOT DETECTED.  The SARS-CoV-2 RNA is generally detectable in upper respiratory specimens during the acute phase of infection. The lowest concentration of SARS-CoV-2 viral copies this assay can detect is 138 copies/mL. A negative result does not preclude SARS-Cov-2 infection and should not be used as the sole basis for treatment or other patient management decisions. A negative result may occur with  improper specimen collection/handling, submission of specimen other than nasopharyngeal swab, presence of viral mutation(s) within the areas targeted by this assay, and inadequate number of viral copies(<138 copies/mL). A negative result must be combined with clinical  observations, patient history, and epidemiological information. The expected result is Negative.  Fact Sheet for Patients:  BloggerCourse.com  Fact Sheet for Healthcare Providers:  SeriousBroker.it  This test is no t yet approved or cleared by the [place] FDA and  has been authorized for detection and/or diagnosis of SARS-CoV-2 by FDA under an Emergency Use Authorization (EUA). This EUA will remain  in effect (meaning this test can be used) for the duration of the COVID-19 declaration under Section 564(b)(1) of the Act, 21 U.S.C.section 360bbb-3(b)(1), unless the authorization is terminated  or revoked sooner.       Influenza A by PCR NEGATIVE NEGATIVE Final   Influenza B by PCR NEGATIVE NEGATIVE Final    Comment: (NOTE) The Xpert Xpress SARS-CoV-2/FLU/RSV plus assay is intended as an aid in the diagnosis of influenza from Nasopharyngeal swab specimens and should not be used as a sole basis for treatment. Nasal washings and aspirates are unacceptable for Xpert Xpress SARS-CoV-2/FLU/RSV testing.  Fact Sheet for Patients: BloggerCourse.com  Fact Sheet for Healthcare Providers: SeriousBroker.it  This test is not yet approved or cleared by the [place] FDA and has been authorized for detection and/or diagnosis of SARS-CoV-2 by FDA under an Emergency Use Authorization (EUA). This EUA will remain in effect (meaning this test can be used) for the duration of the COVID-19 declaration under Section 564(b)(1) of the Act, 21 U.S.C. section 360bbb-3(b)(1), unless the authorization is terminated or revoked.  Performed at [facility], [address], [place], [place] [address]      Radiology Studies:  No results found.   Scheduled Meds:   . calcium acetate  1,334 mg Oral TID WC  . Chlorhexidine Gluconate Cloth  6 each Topical Q0600  . [START ON  02/10/2021] Chlorhexidine Gluconate Cloth  6 each Topical Q0600  . darbepoetin (ARANESP) injection - DIALYSIS  100 mcg Intravenous Q Tue-HD    Continuous Infusions:     LOS: 9 days     Marcellus Scott, MD, [place], [facility]. [facility]    To contact the attending provider between 7A-7P or the covering provider during after hours 7P-7A, please log into the web site [email or web address] and access using universal [place] password for that web site. If you do not have the password, please call the hospital operator.  02/09/2021, 3:48 PM

## 2021-02-09 NOTE — Progress Notes (Signed)
[facility] NEPHROLOGY PROGRESS NOTE  Assessment/ Plan: Pt is a 40 y.o. yo male who was presented on 4/20 with acute uremic symptoms for about 4 days.  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  Presents with creatinine level 14-15.  He reported that he was trying to self dehydrated for the computations and he was using oral creatinine supplements.  Denies use of NSAIDs.  #Acute kidney injury, oliguric and now dialysis dependent: Preliminary biopsy report with prevent nephropathy and severe ATN.  The patient was self dehydrating (for body building competition) in addition to the use of OTC diuretic pill, oral supplements including creatinine etc. No thrombocytopenia and peripheral smear without schistocytes ruled out TMA,  pending ADAMTS 13 activity.  Follow-up final biopsy result. The serology including ANA, ANCA, complements, hep B, hep C, HIV negative, protein electrophoresis unremarkable.  CT scan without hydronephrosis or renal mass.    Status post RIJ TDC placed by IR on 4/21 and started dialysis. Tolerating dialysis well.  Last dialysis work yesterday with around 2.5 L UF. He is now having some urine output, watch for renal recovery.  Meantime we need to arrange for outpatient HD for acute kidney injury, renal navigator is aware. Plan for next dialysis tomorrow, will review a.m. labs. Preliminary biopsy result, plan for dialysis and outpatient plan discussed with the patient and his mother today.    #Peripheral edema: Managed with dialysis.  #Metabolic acidosis improved with dialysis.  #Hyperkalemia improved.  #Hyperphosphatemia: started PhosLo, PTH 38.    #Elevated liver enzymes, LFTs improving.  # Anemia:Iron saturation 21%.  Received IV iron and continue Aranesp. Monitor hemoglobin.  Discussed with the primary team  Subjective: Seen and examined.  He had many questions about his kidney function and status of improvement.  I  discussed with him and with his mother over the phone, as per patient's request.  Denies nausea, vomiting, chest pain, shortness of breath.  Frustrated about no improvement in kidney function  Objective Vital signs in last 24 hours: Vitals:   02/09/21 0017 02/09/21 0535 02/09/21 0620 02/09/21 0817  BP: 124/69 125/68  133/72  Pulse: 72 68  64  Resp: 15 17  16   Temp: 98.7 F (37.1 C) 98.8 F (37.1 C)  98.9 F (37.2 C)  TempSrc: Oral Oral  Axillary  SpO2:  99%    Weight:   92.9 kg   Height:       Weight change: -1.73 kg  Intake/Output Summary (Last 24 hours) at 02/09/2021 1110 Last data filed at 02/09/2021 1050 Gross per 24 hour  Intake --  Output 1295 ml  Net -1295 ml       Labs: Basic Metabolic Panel: Recent Labs  Lab 02/07/21 0428 02/08/21 0248 02/09/21 0332  NA 135 137 138  K 4.1 4.6 4.5  CL 98 101 101  CO2 26 23 27   GLUCOSE 87 127* 92  BUN 53* 75* 61*  CREATININE 8.25* 10.03* 8.27*  CALCIUM 7.9* 7.9* 8.5*   Liver Function Tests: Recent Labs  Lab 02/07/21 0428 02/08/21 0248 02/09/21 0332  AST 93* 84* 59*  ALT 283* 214* 181*  ALKPHOS 72 85 71  BILITOT 1.4* 0.9 1.1  PROT 5.3* 4.9* 5.3*  ALBUMIN 2.7* 2.4* 2.6*   No results for input(s): LIPASE, AMYLASE in the last 168 hours. No results for input(s): AMMONIA in the last 168 hours. CBC: Recent Labs  Lab 02/05/21 0309 02/05/21 1549 02/06/21 0303 02/07/21 02/08/21 02/08/21 0248 02/09/21 02/10/21  WBC 4.8  4.7 4.6 4.7 6.5 8.0 8.7  NEUTROABS 2.9 2.9 2.7  --   --   --   HGB 9.8*  9.8* 9.8* 9.2* 10.5* 8.9* 9.1*  HCT 29.3*  29.1* 29.2* 27.3* 31.9* 27.3* 27.6*  MCV 93.9  93.9 93.9 93.2 96.1 97.8 97.2  PLT 101*  94* 117* 119* 145* 179 238   Cardiac Enzymes: Recent Labs  Lab 02/04/21 0118 02/05/21 0309 02/06/21 0303 02/07/21 0428 02/08/21 0248  CKTOTAL 163 147 128 119 656*   CBG: No results for input(s): GLUCAP in the last 168 hours.  Iron Studies:  No results for input(s): IRON, TIBC,  TRANSFERRIN, FERRITIN in the last 72 hours. Studies/Results: No results found.  Medications: Infusions:   Scheduled Medications: . calcium acetate  1,334 mg Oral TID WC  . Chlorhexidine Gluconate Cloth  6 each Topical Q0600  . darbepoetin (ARANESP) injection - DIALYSIS  100 mcg Intravenous Q Tue-HD    have reviewed scheduled and prn medications.  Physical Exam: General:NAD, comfortable Heart:RRR, s1s2 nl, no rubs Lungs: Clear b/l, no crackle Abdomen:soft, Non-tender, non-distended Extremities: Lower extremities pitting edema present. Dialysis Access: RIJ TDC  Daneshia Tavano Prasad Preesha Benjamin 02/09/2021,11:10 AM  LOS: 9 days

## 2021-02-09 NOTE — TOC Progression Note (Signed)
Transition of Care [facility]) - Progression Note    Patient Details  Name: Evan Vargas MRN: [id number] Date of Birth: 1981-08-30  [facility]) CM/SW Contact  Terrial Rhodes, LCSWA Phone Number: 02/09/2021, 3:36 PM  Clinical Narrative:     CSW received request from CM to reach out to financial counseling for patient to determine if patient is eligible for medicaid. CSW emailed [facility] and requested for her to screen patient for eligibility. CSW will continue to follow.        Expected Discharge Plan and Services                                                 Social Determinants of Health (SDOH) Interventions    Readmission Risk Interventions No flowsheet data found.

## 2021-02-09 NOTE — TOC Initial Note (Signed)
Transition of Care [facility]) - Initial/Assessment Note    Patient Details  Name: Evan Vargas MRN: [id number] Date of Birth: 10/04/1981  Transition of Care [facility]) CM/SW Contact:    Gala Lewandowsky, RN Phone Number: 02/09/2021, 4:46 PM  Clinical Narrative: Patient presented for nausea and vomiting. 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 Patient has been initiated on hemodialysis. The renal navigator has been following the patient regardigng outpatient HD needs. The patient has the diagnosis of AKI- unable to get to an outpatient HD center without active insurance with the diagnosis of AKI.  The patient does not have an ESRD diagnosis at this time. CSW has reached out to the financial counselor and the counselor will see if the patient qualifies for Medicaid. Case Manager spoke with the patient to reiterate the information and that this process may take some time to see if he qualifies. The patient is hopeful and he believes that his kidney function will recover. Case Manager will continue to follow for additional transition of care needs.     Expected Discharge Plan: Home/Self Care Barriers to Discharge: Continued Medical Work up   Patient Goals and CMS Choice Patient states their goals for this hospitalization and ongoing recovery are:: to return home and kidney function recovery   Choice offered to / list presented to : NA  Expected Discharge Plan and Services Expected Discharge Plan: Home/Self Care     Post Acute Care Choice: NA Living arrangements for the past 2 months: Apartment                   DME Agency: NA       HH Arranged: NA          Prior Living Arrangements/Services Living arrangements for the past 2 months: Apartment Lives with:: Self Patient language and need for interpreter reviewed:: Yes Do you feel safe going back to the place where you live?: Yes      Need for Family Participation in Patient Care: Yes  (Comment) Care giver support system in place?: Yes (comment)   Criminal Activity/Legal Involvement Pertinent to Current Situation/Hospitalization: No - Comment as needed  Activities of Daily Living Home Assistive Devices/Equipment: None ADL Screening (condition at time of admission) Patient's cognitive ability adequate to safely complete daily activities?: Yes Is the patient deaf or have difficulty hearing?: No Does the patient have difficulty seeing, even when wearing glasses/contacts?: No Does the patient have difficulty concentrating, remembering, or making decisions?: No Patient able to express need for assistance with ADLs?: Yes Does the patient have difficulty dressing or bathing?: No Independently performs ADLs?: Yes (appropriate for developmental age) Does the patient have difficulty walking or climbing stairs?: Yes Weakness of Legs: None Weakness of Arms/Hands: None   Emotional Assessment Appearance:: Appears stated age     Orientation: : Oriented to Self,Oriented to Place,Oriented to  Time,Oriented to Situation Alcohol / Substance Use: Not Applicable Psych Involvement: No (comment)  Admission diagnosis:  Dehydration [E86.0] Hyperkalemia [E87.5] Bradycardia [R00.1] AKI (acute kidney injury) (HCC) [N17.9] Acute renal failure, unspecified acute renal failure type (HCC) [N17.9] Acute liver failure without hepatic coma [K72.00] Patient Active Problem List   Diagnosis Date Noted  . Acute renal failure (ARF) (HCC) 01/31/2021  . Hyperkalemia 01/31/2021  . High anion gap metabolic acidosis 01/31/2021  . Nausea & vomiting 01/31/2021  . Diarrhea 01/31/2021  . Transaminitis 01/31/2021  . Hyperbilirubinemia 01/31/2021  . Paroxysmal atrial fibrillation (HCC)   .  Acute diastolic CHF (congestive heart failure) (HCC) 01/03/2018  . Acute respiratory failure with hypoxia (HCC) 01/03/2018  . Tachycardia 01/02/2018  . Community acquired pneumonia 01/02/2018   PCP:  Pcp,  No Pharmacy:   [id number] G[place] Otto, [place] - [address] AT [facility] [address] [place] [address] Phone: [phone] Fax: [phone]   Readmission Risk Interventions No flowsheet data found.

## 2021-02-09 NOTE — Progress Notes (Signed)
Renal Navigator met with patient at bedside to discuss referral for outpatient HD. Navigator explained patient that with a diagnosis of AKI, he would need active insurance in order to be admitted to an outpatient HD clinic. 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 Navigator has requested that Case Manager have a Development worker, community meet with patient in the hospital as soon as possible to see if he will qualify for Medicaid. Patient states no one has met with him yet.  Navigator informed patient that if his diagnosis becomes ESRD, he can be accepted with pending insurance, but insurance must be active for an AKI dx. Per Dr. Carolin Sicks, dx will depend on time and renal biopsy results.  Renal Navigator asked patient about his support system. He reports that he has good support through his girlfriend and family. Navigator attempted to brainstorm coping strategies, but patient is frustrated at this time and states that the only way he is able to cope with stress is by going to the gym to work out. He wants to "get out of this bed and go home." He states he feels like a prisoner. Navigator provided active listening and assured him that we are not keeping him prisoner, but trying to care for him and keep him safe.  Navigator awaits change in medical status, either recovery or progression to ESRD, or active insurance for AKI in order to move forward.   Evan Vargas, [place] Renal Navigator [phone]

## 2021-02-10 LAB — RENAL FUNCTION PANEL
Albumin: 2.7 g/dL — ABNORMAL LOW (ref 3.5–5.0)
Anion gap: 12 (ref 5–15)
BUN: 75 mg/dL — ABNORMAL HIGH (ref 6–20)
CO2: 28 mmol/L (ref 22–32)
Calcium: 8.7 mg/dL — ABNORMAL LOW (ref 8.9–10.3)
Chloride: 102 mmol/L (ref 98–111)
Creatinine, Ser: 9.16 mg/dL — ABNORMAL HIGH (ref 0.61–1.24)
GFR, Estimated: 7 mL/min — ABNORMAL LOW (ref >60)
Glucose, Bld: 99 mg/dL (ref 70–99)
Phosphorus: 2.3 mg/dL — ABNORMAL LOW (ref 2.5–4.6)
Potassium: 4.5 mmol/L (ref 3.5–5.1)
Sodium: 142 mmol/L (ref 135–145)

## 2021-02-10 LAB — HEPATIC FUNCTION PANEL
ALT: 151 U/L — ABNORMAL HIGH (ref 0–44)
AST: 51 U/L — ABNORMAL HIGH (ref 15–41)
Albumin: 2.5 g/dL — ABNORMAL LOW (ref 3.5–5.0)
Alkaline Phosphatase: 84 U/L (ref 38–126)
Bilirubin, Direct: 0.3 mg/dL — ABNORMAL HIGH (ref 0.0–0.2)
Indirect Bilirubin: 0.6 mg/dL (ref 0.3–0.9)
Total Bilirubin: 0.9 mg/dL (ref 0.3–1.2)
Total Protein: 5.4 g/dL — ABNORMAL LOW (ref 6.5–8.1)

## 2021-02-10 LAB — CBC
HCT: 31.7 % — ABNORMAL LOW (ref 39.0–52.0)
Hemoglobin: 10.1 g/dL — ABNORMAL LOW (ref 13.0–17.0)
MCH: 31.7 pg (ref 26.0–34.0)
MCHC: 31.9 g/dL (ref 30.0–36.0)
MCV: 99.4 fL (ref 80.0–100.0)
Platelets: 332 10*3/uL (ref 150–400)
RBC: 3.19 MIL/uL — ABNORMAL LOW (ref 4.22–5.81)
RDW: 16.3 % — ABNORMAL HIGH (ref 11.5–15.5)
WBC: 9.9 10*3/uL (ref 4.0–10.5)
nRBC: 0 % (ref 0.0–0.2)

## 2021-02-10 LAB — CK: Total CK: 79 U/L (ref 49–397)

## 2021-02-10 MED ORDER — HEPARIN SODIUM (PORCINE) 1000 UNIT/ML DIALYSIS: 20.0000 [IU]/kg | INTRAMUSCULAR | Status: DC | PRN

## 2021-02-10 MED ORDER — LIDOCAINE HCL (PF) 1 % IJ SOLN: 5.0000 mL | INTRAMUSCULAR | Status: DC | PRN

## 2021-02-10 MED ORDER — PENTAFLUOROPROP-TETRAFLUOROETH EX AERO: 1.0000 "application " | INHALATION_SPRAY | CUTANEOUS | Status: DC | PRN

## 2021-02-10 MED ORDER — HEPARIN SODIUM (PORCINE) 1000 UNIT/ML DIALYSIS: 1000.0000 [IU] | INTRAMUSCULAR | Status: DC | PRN

## 2021-02-10 MED ORDER — LIDOCAINE-PRILOCAINE 2.5-2.5 % EX CREA
1.0000 "application " | TOPICAL_CREAM | CUTANEOUS | Status: DC | PRN
  Filled 2021-02-10: qty 5

## 2021-02-10 MED ORDER — SODIUM CHLORIDE 0.9 % IV SOLN: 100.0000 mL | INTRAVENOUS | Status: DC | PRN

## 2021-02-10 MED ORDER — ALTEPLASE 2 MG IJ SOLR: 2.0000 mg | Freq: Once | INTRAMUSCULAR | Status: DC | PRN

## 2021-02-10 MED ORDER — HEPARIN SODIUM (PORCINE) 1000 UNIT/ML IJ SOLN
INTRAMUSCULAR | Status: AC
  Administered 2021-02-10: 3200 [IU] via INTRAVENOUS_CENTRAL
  Filled 2021-02-10: qty 4

## 2021-02-10 NOTE — Progress Notes (Signed)
Pt has refused blood draw for lab this morning.

## 2021-02-10 NOTE — Progress Notes (Addendum)
[facility] NEPHROLOGY PROGRESS NOTE  Assessment/ Plan: Pt is a 40 y.o. yo male who was presented on 4/20 with acute uremic symptoms for about 4 days.  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  Presents with creatinine level 14-15.  He reported that he was trying to self dehydrated for the computations and he was using oral creatinine supplements.  Denies use of NSAIDs.  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  The patient was self dehydrating (for body building competition) in addition to the use of OTC diuretic pill, oral supplements including creatinine etc. No thrombocytopenia and peripheral smear without schistocytes ruled out TMA,  pending ADAMTS 13 activity.  Follow-up final biopsy result. The serology including ANA, ANCA, complements, hep B, hep C, HIV negative, protein electrophoresis unremarkable.  CT scan without hydronephrosis or renal mass.    Status post RIJ TDC placed by IR on 4/21 and started dialysis. Tolerating dialysis well.  Last dialysis on 4/28 with around 2.5 L UF. The urine output is increasing.  He declined lab test this morning.  On examination he still has volume up and elevated BUN/creatinine level.  We will plan to do another session of dialysis today and continue to watch for renal recovery. I had a long discussion with the patient and his mother at the bedside about his renal disease, dialysis, outpatient arrangement for HD center taking longer because of insurance issues etc.  Renal navigator is already following.  #Peripheral edema: Managed with dialysis.  Urine output increasing.  #Metabolic acidosis improved with dialysis.  #Hyperkalemia improved.  #Hyperphosphatemia: started PhosLo, PTH 38.  Check phosphorus level.  #Elevated liver enzymes, LFTs improving.  # Anemia:Iron saturation 21%.  Received IV  iron and continue Aranesp. Monitor hemoglobin.  Discussed with primary team as well.  Subjective: Seen and examined.  Patient was frustrated about his kidney function and being in the hospital so long.  He declined lab test this morning.  He wanted me to talk to his mother and she came to the room while I was there.  I had a long discussion about his current situation and future plan.  I tried my best to answer all the questions. Urine output 2.1 L in 24 hours.  We will get the lab at the dialysis.  Objective Vital signs in last 24 hours: Vitals:   02/09/21 1158 02/09/21 1704 02/09/21 2115 02/10/21 0454  BP: 125/70 125/70 126/72   Pulse: 60 72 62 63  Resp: 16 16 16 17   Temp: 98.6 F (37 C) 97.9 F (36.6 C) 98 F (36.7 C) 98.7 F (37.1 C)  TempSrc: Oral Oral Oral Oral  SpO2:    100%  Weight:    93 kg  Height:       Weight change: 0.233 kg  Intake/Output Summary (Last 24 hours) at 02/10/2021 1032 Last data filed at 02/10/2021 0917 Gross per 24 hour  Intake 720 ml  Output 2795 ml  Net -2075 ml       Labs: Basic Metabolic Panel: Recent Labs  Lab 02/07/21 0428 02/08/21 0248 02/09/21 0332  NA 135 137 138  K 4.1 4.6 4.5  CL 98 101 101  CO2 26 23 27   GLUCOSE 87 127* 92  BUN 53* 75* 61*  CREATININE 8.25* 10.03* 8.27*  CALCIUM 7.9* 7.9* 8.5*   Liver Function Tests: Recent Labs  Lab 02/07/21 0428 02/08/21 0248 02/09/21 02/10/21  AST 93* 84* 59*  ALT 283* 214* 181*  ALKPHOS 72 85 71  BILITOT 1.4* 0.9 1.1  PROT 5.3* 4.9* 5.3*  ALBUMIN 2.7* 2.4* 2.6*   No results for input(s): LIPASE, AMYLASE in the last 168 hours. No results for input(s): AMMONIA in the last 168 hours. CBC: Recent Labs  Lab 02/05/21 0309 02/05/21 1549 02/06/21 0303 02/07/21 0428 02/08/21 0248 02/09/21 0332  WBC 4.8  4.7 4.6 4.7 6.5 8.0 8.7  NEUTROABS 2.9 2.9 2.7  --   --   --   HGB 9.8*  9.8* 9.8* 9.2* 10.5* 8.9* 9.1*  HCT 29.3*  29.1* 29.2* 27.3* 31.9* 27.3* 27.6*  MCV 93.9  93.9  93.9 93.2 96.1 97.8 97.2  PLT 101*  94* 117* 119* 145* 179 238   Cardiac Enzymes: Recent Labs  Lab 02/04/21 0118 02/05/21 0309 02/06/21 0303 02/07/21 0428 02/08/21 0248  CKTOTAL 163 147 128 119 656*   CBG: No results for input(s): GLUCAP in the last 168 hours.  Iron Studies:  No results for input(s): IRON, TIBC, TRANSFERRIN, FERRITIN in the last 72 hours. Studies/Results: No results found.  Medications: Infusions:   Scheduled Medications: . calcium acetate  1,334 mg Oral TID WC  . Chlorhexidine Gluconate Cloth  6 each Topical Q0600  . Chlorhexidine Gluconate Cloth  6 each Topical Q0600  . darbepoetin (ARANESP) injection - DIALYSIS  100 mcg Intravenous Q Tue-HD    have reviewed scheduled and prn medications.  Physical Exam: General: Sitting on chair, comfortable Heart:RRR, s1s2 nl, no rubs Lungs: Clear b/l, no crackle Abdomen:soft, Non-tender, non-distended Extremities: Lower extremities edema present. Dialysis Access: RIJ TDC  Maitlyn Penza Prasad Kenzi Bardwell 02/10/2021,10:32 AM  LOS: 10 days

## 2021-02-10 NOTE — Progress Notes (Signed)
PROGRESS NOTE   Keylan Costabile  VQM:[id number]    DOB: 11-19-1980    DOA: 01/31/2021  PCP: Pcp, No   I have briefly reviewed patients previous medical records in [facility].    Brief Narrative:  40 year old male with medical history significant for but not limited to paroxysmal atrial fibrillation, not on chronic anticoagulation, chronic diastolic CHF who was admitted to the hospital on 01/31/2021 with acute kidney injury.  He presented to the [facility] ED with complaints of profound nausea, vomiting and diarrhea.  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  Admitted for acute kidney injury, nephrology was consulted, patient was transferred to [facility], initiated on hemodialysis due to acute renal failure with uremia and hyperkalemia.   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  Presented with creatinine of 13.87.  No thrombocytopenia and peripheral smear without schistocytes, ADAMTS 13 pending, less likely TTP/HUS.  ANA, ANCA, complements, hepatitis B, hepatitis C, HIV negative and protein electrophoresis unremarkable.  CT abdomen without hydronephrosis or renal mass.  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  Nephrology consulted and assisting with evaluation and management.  S/p RIJ TDC placed by IR on 4/21  then started HD.  Last HD on 4/28 with plans to dialyze on 4/30.  Monitor for renal recovery.  As per renal navigator, patient lacks insurance, will not be able to get outpatient HD if needed unless he gets insurance, or advances to ESRD.  Volume management across HD.  Urine output picking up.  As discussed with Dr. Carolin Sicks, plan for HD today followed by monitoring off of HD for renal recovery.  Hyperkalemia due to acute kidney injury Resolved  Anion gap metabolic acidosis due to acute kidney injury Resolved  Hyperphosphatemia Per nephrology.  PTH 38.  They have started PhosLo.  Phosphorus low today.  Rhabdomyolysis Possibly related to bodybuilding and other supplements as noted above.  CK had come down but back up again in the 600 range.  Will add CK to this morning's labs.  Elevated LFTs Hepatitis panel and HIV negative.  No history of acetaminophen use.  He was taking other supplements including creatinine, glutamine, protein and expel which could have been culprits.  RUQ ultrasound showed diffuse abdominal ascites, right pleural effusion and fatty infiltration of the liver.  May be multifactorial due to shock liver, rhabdomyolysis (CK5 71 on 4/20 which had come down to 119 and is back up again at 656 on 4/28).  Continue to gradually improve.  Some of it could also be due to passive congestion.  Consider outpatient GI consultation and follow-up.  Will add LFTs to this morning's labs.  Acute anemia May have baseline anemia.  Hemoglobin in 2019 was 11.6.  Now stable.  Unclear etiology.  Thrombocytopenia Possibly due to rhabdomyolysis, AKI.  Resolved.  Paroxysmal atrial fibrillation Remains in sinus rhythm.  CHA2DS2-VASc score 1.  Body mass  index is 30.3 kg/m.    DVT prophylaxis: SCDs Start: 01/31/21 2138     Code Status: Full Code Family Communication: None at bedside. Disposition:  Status is: Inpatient  Remains inpatient appropriate because:Inpatient level of care appropriate  due to severity of illness   Dispo: The patient is from: Home              Anticipated d/c is to: Home              Patient currently is not medically stable to d/c.   Difficult to place patient No        Consultants:   Nephrology IR  Procedures:   Renal biopsy RIJ TDC by IR on 4/21 Hemodialysis  Antimicrobials:    Anti-infectives (From admission, onward)   Start     Dose/Rate Route Frequency Ordered Stop   02/01/21 1505  ceFAZolin (ANCEF) 2-4 GM/100ML-% IVPB       Note to Pharmacy: Domenick Bookbinder   : cabinet override      02/01/21 1505 02/01/21 1516        Subjective:  Patient denied complaints this morning.  He had refused a.m. labs which were subsequently drawn across HD.  Dr. Carolin Sicks met with him and his mother and I had a long discussion with him prior to my visit.  I saw him prior to dialysis this morning.  Objective:   Vitals:   02/10/21 1400 02/10/21 1430 02/10/21 1500 02/10/21 1525  BP: 135/77 134/79 (!) 152/80 137/75  Pulse: 92 91 98 97  Resp: 19   18  Temp:    98.7 F (37.1 C)  TempSrc:    Oral  SpO2:    98%  Weight:    90.4 kg  Height:        General exam: Young male, well-built and nourished sitting up comfortably in chair. Respiratory system: Clear to auscultation.  No increased work of breathing. Cardiovascular system: S1 & S2 heard, RRR. No JVD, murmurs, rubs, gallops or clicks.  All extremities pitting edema, lower extremities 2+, upper extremities trace.  Telemetry personally reviewed: Sinus rhythm. Gastrointestinal system: Abdomen is nondistended, soft and nontender. No organomegaly or masses felt. Normal bowel sounds heard. Central nervous system: Alert and oriented. No focal neurological deficits. Extremities: Symmetric 5 x 5 power. Skin: No rashes, lesions or ulcers Psychiatry: Judgement and insight appear normal. Mood & affect appropriate.     Data Reviewed:   I have personally reviewed following labs and imaging  studies   CBC: Recent Labs  Lab 02/05/21 0309 02/05/21 1549 02/06/21 0303 02/07/21 0428 02/08/21 0248 02/09/21 0332 02/10/21 1050  WBC 4.8  4.7 4.6 4.7   < > 8.0 8.7 9.9  NEUTROABS 2.9 2.9 2.7  --   --   --   --   HGB 9.8*  9.8* 9.8* 9.2*   < > 8.9* 9.1* 10.1*  HCT 29.3*  29.1* 29.2* 27.3*   < > 27.3* 27.6* 31.7*  MCV 93.9  93.9 93.9 93.2   < > 97.8 97.2 99.4  PLT 101*  94* 117* 119*   < > 179 238 332   < > = values in this interval not displayed.    Basic Metabolic Panel: Recent Labs  Lab 02/08/21 0248 02/09/21 0332 02/10/21 1050  NA 137 138 142  K 4.6 4.5 4.5  CL 101 101 102  CO2 23 27 28   GLUCOSE 127* 92 99  BUN 75* 61* 75*  CREATININE 10.03* 8.27* 9.16*  CALCIUM 7.9* 8.5* 8.7*  PHOS  --   --  2.3*    Liver Function Tests: Recent Labs  Lab 02/07/21 0428 02/08/21 0248 02/09/21 0332 02/10/21 1050  AST 93* 84* 59*  --   ALT 283* 214* 181*  --   ALKPHOS 72 85 71  --   BILITOT 1.4* 0.9 1.1  --   PROT 5.3* 4.9* 5.3*  --   ALBUMIN 2.7* 2.4* 2.6* 2.7*    CBG: No results for input(s): GLUCAP in the last 168 hours.  Microbiology Studies:   Recent Results (from the past 240 hour(s))  Resp Panel by RT-PCR (Flu A&B, Covid) Nasopharyngeal Swab     Status: None   Collection Time: 01/31/21 10:40 PM   Specimen: Nasopharyngeal Swab; Nasopharyngeal(NP) swabs in vial transport medium  Result Value Ref Range Status   SARS Coronavirus 2 by RT PCR NEGATIVE NEGATIVE Final    Comment: (NOTE) SARS-CoV-2 target nucleic acids are NOT DETECTED.  The SARS-CoV-2 RNA is generally detectable in upper respiratory specimens during the acute phase of infection. The lowest concentration of SARS-CoV-2 viral copies this assay can detect is 138 copies/mL. A negative result does not preclude SARS-Cov-2 infection and should not be used as the sole basis for treatment or other patient management decisions. A negative result may occur with  improper specimen collection/handling,  submission of specimen other than nasopharyngeal swab, presence of viral mutation(s) within the areas targeted by this assay, and inadequate number of viral copies(<138 copies/mL). A negative result must be combined with clinical observations, patient history, and epidemiological information. The expected result is Negative.  Fact Sheet for Patients:  EntrepreneurPulse.com.au  Fact Sheet for Healthcare Providers:  IncredibleEmployment.be  This test is no t yet approved or cleared by the [place] FDA and  has been authorized for detection and/or diagnosis of SARS-CoV-2 by FDA under an Emergency Use Authorization (EUA). This EUA will remain  in effect (meaning this test can be used) for the duration of the COVID-19 declaration under Section 564(b)(1) of the Act, 21 U.S.C.section 360bbb-3(b)(1), unless the authorization is terminated  or revoked sooner.       Influenza A by PCR NEGATIVE NEGATIVE Final   Influenza B by PCR NEGATIVE NEGATIVE Final    Comment: (NOTE) The Xpert Xpress SARS-CoV-2/FLU/RSV plus assay is intended as an aid in the diagnosis of influenza from Nasopharyngeal swab specimens and should not be used as a sole basis for treatment. Nasal washings and aspirates are unacceptable for Xpert Xpress SARS-CoV-2/FLU/RSV testing.  Fact Sheet for Patients: EntrepreneurPulse.com.au  Fact Sheet for Healthcare Providers: IncredibleEmployment.be  This test is not yet approved or cleared by the [place] FDA and has been authorized for detection and/or diagnosis of SARS-CoV-2 by FDA under an Emergency Use Authorization (EUA). This EUA will remain in effect (meaning this test can be used) for the duration of the COVID-19 declaration under Section 564(b)(1) of the Act, 21 U.S.C. section 360bbb-3(b)(1), unless the authorization is terminated or revoked.  Performed at [facility], [place] [address], [place], [place] [address]      Radiology Studies:  No results found.   Scheduled Meds:   . calcium acetate  1,334 mg Oral TID WC  . Chlorhexidine Gluconate Cloth  6 each Topical Q0600  . Chlorhexidine Gluconate Cloth  6 each Topical Q0600  . darbepoetin (ARANESP) injection - DIALYSIS  100 mcg Intravenous Q Tue-HD    Continuous Infusions:     LOS: 10 days  Vernell Leep, MD, [place], [facility]. [facility]    To contact the attending provider between 7A-7P or the covering provider during after hours 7P-7A, please log into the web site [email or web address] and access using universal [place] password for that web site. If you do not have the password, please call the hospital operator.  02/10/2021, 5:06 PM

## 2021-02-11 LAB — COMPREHENSIVE METABOLIC PANEL
ALT: 131 U/L — ABNORMAL HIGH (ref 0–44)
AST: 42 U/L — ABNORMAL HIGH (ref 15–41)
Albumin: 2.5 g/dL — ABNORMAL LOW (ref 3.5–5.0)
Alkaline Phosphatase: 62 U/L (ref 38–126)
Anion gap: 11 (ref 5–15)
BUN: 56 mg/dL — ABNORMAL HIGH (ref 6–20)
CO2: 25 mmol/L (ref 22–32)
Calcium: 8.1 mg/dL — ABNORMAL LOW (ref 8.9–10.3)
Chloride: 101 mmol/L (ref 98–111)
Creatinine, Ser: 7.22 mg/dL — ABNORMAL HIGH (ref 0.61–1.24)
GFR, Estimated: 9 mL/min — ABNORMAL LOW (ref >60)
Glucose, Bld: 95 mg/dL (ref 70–99)
Potassium: 3.8 mmol/L (ref 3.5–5.1)
Sodium: 137 mmol/L (ref 135–145)
Total Bilirubin: 0.8 mg/dL (ref 0.3–1.2)
Total Protein: 5 g/dL — ABNORMAL LOW (ref 6.5–8.1)

## 2021-02-11 NOTE — Progress Notes (Signed)
PROGRESS NOTE   Evan Vargas  GYJ:[id number]    DOB: [birth year]    DOA: 01/31/2021  PCP: Pcp, No   I have briefly reviewed patients previous medical records in [facility].    Brief Narrative:  40 year old male with medical history significant for but not limited to paroxysmal atrial fibrillation, not on chronic anticoagulation, chronic diastolic CHF who was admitted to the hospital on 01/31/2021 with acute kidney injury.  He presented to the [facility] ED with complaints of profound nausea, vomiting and diarrhea.  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  Admitted for acute kidney injury, nephrology was consulted, patient was transferred to [facility], initiated on hemodialysis due to acute renal failure with uremia and hyperkalemia.   Assessment & Plan:   Acute kidney injury thought to be secondary to rhabdomyolysis Presented with creatinine of 13.87.  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   CT abdomen without hydronephrosis or renal mass.   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   Nephrology was consulted and is managing this issue.   S/p RIJ TDC placed by IR on 4/21 then started HD.  Last dialyzed on 4/30.  Waiting for renal recovery.   Patient mentions that he has been urinating well.  Hyperkalemia due to acute kidney injury Resolved  Anion gap metabolic acidosis due to acute kidney injury Resolved  Hyperphosphatemia PTH 38.  Started on PhosLo by nephrology.  And managing.    Rhabdomyolysis Possibly related to bodybuilding and other supplements as noted above.  CK level noted to be 79 yesterday  morning.  Elevated LFTs Hepatitis panel and HIV negative.  No history of acetaminophen use.  He was taking other supplements including creatinine, glutamine, protein and expel which could have been culprits.  RUQ ultrasound showed diffuse abdominal ascites, right pleural effusion and fatty infiltration of the liver.   May be multifactorial due to shock liver, rhabdomyolysis.  LFTs gradually improving.  Outpatient monitoring.   Normocytic anemia May have baseline anemia.  Hemoglobin in 2019 was 11.6.  Now stable.  Unclear etiology.  Thrombocytopenia Possibly due to rhabdomyolysis, AKI.  Resolved.  Paroxysmal atrial fibrillation Remains in sinus rhythm.  CHA2DS2-VASc score 1.  Obesity Estimated body mass index is 30.03 kg/m as calculated from the following:   Height as of this encounter: 5\' 8"  (1.727 m).   Weight as of this encounter: 89.6 kg.    DVT prophylaxis: SCDs  Code Status: Full Code Family Communication: None at bedside. Disposition: Hopefully return home when improved.  Continue to mobilize.  Status is: Inpatient  Remains inpatient appropriate because:Inpatient level of care appropriate due to severity of illness   Dispo: The patient is from: Home              Anticipated d/c is to: Home              Patient currently is not medically stable to d/c.   Difficult to place patient No        Consultants:   Nephrology IR  Procedures:   Renal biopsy RIJ TDC by IR on 4/21 Hemodialysis  Antimicrobials:    Anti-infectives (From admission, onward)   Start  Dose/Rate Route Frequency Ordered Stop   02/01/21 1505  ceFAZolin (ANCEF) 2-4 GM/100ML-% IVPB       Note to Pharmacy: Cristy Friedlander   : cabinet override      02/01/21 1505 02/01/21 1516        Subjective:  Patient denies any complaints this morning.  Specifically no shortness of breath nausea or vomiting.  Has been urinating.  Has been ambulating.     Objective:   Vitals:   02/10/21 1525  02/10/21 2135 02/10/21 2341 02/11/21 0655  BP: 137/75 125/76 121/65 121/73  Pulse: 97 75 75 65  Resp: 18 17 16 17   Temp: 98.7 F (37.1 C) 98.9 F (37.2 C) 98.7 F (37.1 C) 98.5 F (36.9 C)  TempSrc: Oral Oral Oral Oral  SpO2: 98% 99% 100% 98%  Weight: 90.4 kg   89.6 kg  Height:        General appearance: Awake alert.  In no distress Resp: Clear to auscultation bilaterally.  Normal effort Cardio: S1-S2 is normal regular.  No S3-S4.  No rubs murmurs or bruit GI: Abdomen is soft.  Nontender nondistended.  Bowel sounds are present normal.  No masses organomegaly Extremities: 2+ pitting edema noted bilateral lower extremities Neurologic: Alert and oriented x3.  No focal neurological deficits.      Data Reviewed:   I have personally reviewed following labs and imaging studies   CBC: Recent Labs  Lab 02/05/21 0309 02/05/21 1549 02/06/21 0303 02/07/21 0428 02/08/21 0248 02/09/21 0332 02/10/21 1050  WBC 4.8  4.7 4.6 4.7   < > 8.0 8.7 9.9  NEUTROABS 2.9 2.9 2.7  --   --   --   --   HGB 9.8*  9.8* 9.8* 9.2*   < > 8.9* 9.1* 10.1*  HCT 29.3*  29.1* 29.2* 27.3*   < > 27.3* 27.6* 31.7*  MCV 93.9  93.9 93.9 93.2   < > 97.8 97.2 99.4  PLT 101*  94* 117* 119*   < > 179 238 332   < > = values in this interval not displayed.    Basic Metabolic Panel: Recent Labs  Lab 02/09/21 0332 02/10/21 1050 02/11/21 0155  NA 138 142 137  K 4.5 4.5 3.8  CL 101 102 101  CO2 27 28 25   GLUCOSE 92 99 95  BUN 61* 75* 56*  CREATININE 8.27* 9.16* 7.22*  CALCIUM 8.5* 8.7* 8.1*  PHOS  --  2.3*  --     Liver Function Tests: Recent Labs  Lab 02/09/21 0332 02/10/21 1050 02/10/21 1742 02/11/21 0155  AST 59*  --  51* 42*  ALT 181*  --  151* 131*  ALKPHOS 71  --  84 62  BILITOT 1.1  --  0.9 0.8  PROT 5.3*  --  5.4* 5.0*  ALBUMIN 2.6* 2.7* 2.5* 2.5*      Microbiology Studies:   No results found for this or any previous visit (from the past 240 hour(s)).   Radiology Studies:   No results found.   Scheduled Meds:   . calcium acetate  1,334 mg Oral TID WC  . Chlorhexidine Gluconate Cloth  6 each Topical Q0600  . Chlorhexidine Gluconate Cloth  6 each Topical Q0600  . darbepoetin (ARANESP) injection - DIALYSIS  100 mcg Intravenous Q Tue-HD    Continuous Infusions:     LOS: 11 days     Evan Vargas [facility]    To contact the attending provider between 7A-7P or the covering  provider during after hours 7P-7A, please log into the web site [email or web address] and access using universal [place] password for that web site. If you do not have the password, please call the hospital operator.  02/11/2021, 10:59 AM

## 2021-02-11 NOTE — Progress Notes (Signed)
[facility] NEPHROLOGY PROGRESS NOTE  Assessment/ Plan: Pt is a 40 y.o. yo male who was presented on 4/20 with acute uremic symptoms for about 4 days.  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  Presents with creatinine level 14-15.  He reported that he was trying to self dehydrated for the computations and he was using oral creatinine supplements.  Denies use of NSAIDs.  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  The patient was self dehydrating (for body building competition) in addition to the use of OTC diuretic pill, oral supplements including creatinine etc. No thrombocytopenia and peripheral smear without schistocytes ruled out TMA.  Follow-up final biopsy result. The serology including ANA, ANCA, complements, hep B, hep C, HIV negative, protein electrophoresis unremarkable.  CT scan without hydronephrosis or renal mass.    Status post RIJ TDC placed by IR on 4/21 and started dialysis.  He has been tolerating dialysis well, last HD on 4/30 with 3 L UF, tolerated well. His urine output is increasing, around 2.2 L in 24 hours.  We will monitor daily labs and urine output for renal recovery.  I had a long discussion with the patient and his mother at the bedside about his renal disease, dialysis, outpatient arrangement for HD  taking longer because of insurance issues etc on 4/30.  Renal navigator is already following.  At the same time we are watching for renal recovery.  #Peripheral edema: Managing with dialysis.  Now with increased urine output the edema expect to improve.  #Metabolic acidosis improved with dialysis.  #Hyperkalemia improved.  #Hyperphosphatemia: Discontinue PhosLo because of low phosphorus level, PTH 38.    #Elevated liver enzymes, LFTs improving.  # Anemia:Iron saturation 21%.  Received IV iron and continue  Aranesp. Monitor hemoglobin.  Discussed with primary team as well.  Subjective: Seen and examined.  No new event.  He had dialysis yesterday and with increasing urine output to 2.2 L in 24 hours.  Denies nausea vomiting chest pain shortness of breath.  He still has lower extremity edema.Oxygenation acceptable.  Objective Vital signs in last 24 hours: Vitals:   02/10/21 1525 02/10/21 2135 02/10/21 2341 02/11/21 0655  BP: 137/75 125/76 121/65 121/73  Pulse: 97 75 75 65  Resp: 18 17 16 17   Temp: 98.7 F (37.1 C) 98.9 F (37.2 C) 98.7 F (37.1 C) 98.5 F (36.9 C)  TempSrc: Oral Oral Oral Oral  SpO2: 98% 99% 100% 98%  Weight: 90.4 kg   89.6 kg  Height:       Weight change: -0.033 kg  Intake/Output Summary (Last 24 hours) at 02/11/2021 1106 Last data filed at 02/11/2021 0700 Gross per 24 hour  Intake 360 ml  Output 4400 ml  Net -4040 ml       Labs: Basic Metabolic Panel: Recent Labs  Lab 02/09/21 0332 02/10/21 1050 02/11/21 0155  NA 138 142 137  K 4.5 4.5 3.8  CL 101 102 101  CO2 27 28 25   GLUCOSE 92 99 95  BUN 61* 75* 56*  CREATININE 8.27* 9.16* 7.22*  CALCIUM 8.5* 8.7* 8.1*  PHOS  --  2.3*  --    Liver Function Tests: Recent Labs  Lab 02/09/21 0332 02/10/21 1050 02/10/21 1742 02/11/21 0155  AST 59*  --  51* 42*  ALT 181*  --  151* 131*  ALKPHOS 71  --  84 62  BILITOT 1.1  --  0.9 0.8  PROT 5.3*  --  5.4* 5.0*  ALBUMIN 2.6* 2.7* 2.5* 2.5*   No results for input(s): LIPASE, AMYLASE in the last 168 hours. No results for input(s): AMMONIA in the last 168 hours. CBC: Recent Labs  Lab 02/05/21 0309 02/05/21 1549 02/06/21 0303 02/07/21 0428 02/08/21 0248 02/09/21 0332 02/10/21 1050  WBC 4.8  4.7 4.6 4.7 6.5 8.0 8.7 9.9  NEUTROABS 2.9 2.9 2.7  --   --   --   --   HGB 9.8*  9.8* 9.8* 9.2* 10.5* 8.9* 9.1* 10.1*  HCT 29.3*  29.1* 29.2* 27.3* 31.9* 27.3* 27.6* 31.7*  MCV 93.9  93.9 93.9 93.2 96.1 97.8 97.2 99.4  PLT 101*  94* 117* 119* 145* 179 238  332   Cardiac Enzymes: Recent Labs  Lab 02/05/21 0309 02/06/21 0303 02/07/21 0428 02/08/21 0248 02/10/21 1742  CKTOTAL 147 128 119 656* 79   CBG: No results for input(s): GLUCAP in the last 168 hours.  Iron Studies:  No results for input(s): IRON, TIBC, TRANSFERRIN, FERRITIN in the last 72 hours. Studies/Results: No results found.  Medications: Infusions:   Scheduled Medications: . calcium acetate  1,334 mg Oral TID WC  . Chlorhexidine Gluconate Cloth  6 each Topical Q0600  . Chlorhexidine Gluconate Cloth  6 each Topical Q0600  . darbepoetin (ARANESP) injection - DIALYSIS  100 mcg Intravenous Q Tue-HD    have reviewed scheduled and prn medications.  Physical Exam: General: Lying on bed, not in distress Heart:RRR, s1s2 nl, no rubs Lungs: Clear b/l, no crackle Abdomen:soft, Non-tender, non-distended Extremities: Lower extremities edema +. Dialysis Access: RIJ TDC  Evan [facility]argas 02/11/2021,11:06 AM  LOS: 11 days

## 2021-02-12 LAB — BASIC METABOLIC PANEL
Anion gap: 12 (ref 5–15)
BUN: 72 mg/dL — ABNORMAL HIGH (ref 6–20)
CO2: 26 mmol/L (ref 22–32)
Calcium: 8.8 mg/dL — ABNORMAL LOW (ref 8.9–10.3)
Chloride: 105 mmol/L (ref 98–111)
Creatinine, Ser: 7.38 mg/dL — ABNORMAL HIGH (ref 0.61–1.24)
GFR, Estimated: 9 mL/min — ABNORMAL LOW (ref >60)
Glucose, Bld: 122 mg/dL — ABNORMAL HIGH (ref 70–99)
Potassium: 3.6 mmol/L (ref 3.5–5.1)
Sodium: 143 mmol/L (ref 135–145)

## 2021-02-12 MED ORDER — DARBEPOETIN ALFA 100 MCG/0.5ML IJ SOSY
100.0000 ug | PREFILLED_SYRINGE | INTRAMUSCULAR | Status: DC
  Filled 2021-02-12: qty 0.5

## 2021-02-12 MED ORDER — DARBEPOETIN ALFA 100 MCG/0.5ML IJ SOSY: 100.0000 ug | PREFILLED_SYRINGE | INTRAMUSCULAR | Status: DC

## 2021-02-12 NOTE — Progress Notes (Signed)
[facility]  PROGRESS NOTE  Evan [facility]as IRJ:[id number] DOB: 1981/06/05 DOA: 01/31/2021 PCP: Pcp, No   Brief HPI:   40 year old male with history of paroxysmal atrial fibrillation, not on chronic anticoagulation, chronic diastolic heart failure who was admitted to hospital on 01/31/2021 with acute kidney injury.  Patient presented to [facility] long ED with complaints of profound nausea, vomiting and diarrhea without any signs and symptoms of bleeding.  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  He cut down fluids while taking protein supplements.  He was found to be in acute kidney injury, nephrology was consulted patient was transferred to [facility] for dialysis due to significantly elevated creatinine and potassium.  Patient was started on dialysis, kidney function continued to improve but not back to baseline.  Nephrology following.  Planning to start outpatient hemodialysis.    Subjective   Patient seen and examined, no dialysis planned for today.   Assessment/Plan:     1. Acute kidney injury-patient developed nausea and vomiting after eating burger, after he had bodybuilding competition.  He was trying to self dehydrate himself by taking Expel, OTC diuretic prior to competition.  Also takes creatine, protein, glutamine, salt substitute.  No significant NSAID exposure.  Patient was found to be in acute kidney injury with creatinine of 13.87.  Nephrology was consulted.  CT scan showed no hydronephrosis or renal mass.  Pending ADA MTS 13 activity.  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  Patient Is Status Post Right IJ DDS Placed by IR on 4/21 and Started on Hemodialysis.  Patient Underwent Renal Biopsy on 02/07/2021.  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  Final result is currently pending.  Nephrology is working to set up outpatient hemodialysis for acute  kidney injury.  No hemodialysis planned for today. 2. Hyperkalemia-Secondary to above, Resolved. 3. Elevated LFTs-patient had significantly elevated LFTs, likely from hypovolemia resulting in shock liver.  Hepatitis panel is negative, HIV negative.  No history of Tylenol use.  Patient was taking supplements including creatine, glutamine, protein, Expel.  This could also have been culprits and causing the transaminitis.  Right upper quadrant ultrasound showed diffuse abdominal ascites, right pleural effusion, fatty infiltration of liver.  LFTs are slowly improving.  He might need GI follow-up as outpatient. 4. Right pleural effusion/ascites-likely from volume overload due to acute kidney injury. 5. Thrombocytopenia-likely from rhabdomyolysis.  Resolved 6. Anion gap metabolic acidosis-secondary to acute kidney injury, rhabdomyolysis.  Resolved 7. Elevated CPK-very minimally elevated, improved. 8. Paroxysmal atrial fibrillation-patient has history of paroxysmal atrial fibrillation.  Currently sinus rhythm.  Not on anticoagulation. CHA2DS2VASc score of 1.   Scheduled medications:   . Chlorhexidine Gluconate Cloth  6 each Topical Q0600  . Chlorhexidine Gluconate Cloth  6 each Topical Q0600  . [START ON 02/13/2021] darbepoetin (ARANESP) injection - DIALYSIS  100 mcg Intravenous Q Tue-HD         Data Reviewed:   CBG:  No results for input(s): GLUCAP in the last 168 hours.  SpO2: 100 % O2 Flow Rate (L/min): 2 L/min    Vitals:   02/12/21 0240 02/12/21 0635 02/12/21 0834 02/12/21 1240  BP:  130/76 134/69 135/66  Pulse:  61 65 71  Resp:  19 16 16   Temp: 99.3 F (37.4 C) 99 F (37.2 C)  98.4 F (36.9 C)  TempSrc:  Oral  Axillary  SpO2:  100%    Weight:  88.9 kg  Height:         Intake/Output Summary (Last 24 hours) at 02/12/2021 1801 Last data filed at 02/12/2021 1748 Gross per 24 hour  Intake 960 ml  Output 3125 ml  Net -2165 ml    04/30 1901 - 05/02 0700 In: 720  [P.O.:720] Out: 4250 [Urine:4250]  Filed Weights   02/10/21 1525 02/11/21 0655 02/12/21 0635  Weight: 90.4 kg 89.6 kg 88.9 kg    CBC:  Recent Labs  Lab 02/06/21 0303 02/07/21 0428 02/08/21 0248 02/09/21 0332 02/10/21 1050  WBC 4.7 6.5 8.0 8.7 9.9  HGB 9.2* 10.5* 8.9* 9.1* 10.1*  HCT 27.3* 31.9* 27.3* 27.6* 31.7*  PLT 119* 145* 179 238 332  MCV 93.2 96.1 97.8 97.2 99.4  MCH 31.4 31.6 31.9 32.0 31.7  MCHC 33.7 32.9 32.6 33.0 31.9  RDW 16.4* 16.5* 16.6* 16.2* 16.3*  LYMPHSABS 1.1  --   --   --   --   MONOABS 0.8  --   --   --   --   EOSABS 0.1  --   --   --   --   BASOSABS 0.0  --   --   --   --     Complete metabolic panel:  Recent Labs  Lab 02/07/21 0428 02/08/21 0248 02/09/21 0332 02/10/21 1050 02/10/21 1742 02/11/21 0155 02/12/21 0311  NA 135 137 138 142  --  137 143  K 4.1 4.6 4.5 4.5  --  3.8 3.6  CL 98 101 101 102  --  101 105  CO2 26 23 27 28   --  25 26  GLUCOSE 87 127* 92 99  --  95 122*  BUN 53* 75* 61* 75*  --  56* 72*  CREATININE 8.25* 10.03* 8.27* 9.16*  --  7.22* 7.38*  CALCIUM 7.9* 7.9* 8.5* 8.7*  --  8.1* 8.8*  AST 93* 84* 59*  --  51* 42*  --   ALT 283* 214* 181*  --  151* 131*  --   ALKPHOS 72 85 71  --  84 62  --   BILITOT 1.4* 0.9 1.1  --  0.9 0.8  --   ALBUMIN 2.7* 2.4* 2.6* 2.7* 2.5* 2.5*  --     No results for input(s): LIPASE, AMYLASE in the last 168 hours.  No results for input(s): CRP, DDIMER, BNP, PROCALCITON, SARSCOV2NAA in the last 168 hours.  Invalid input(s): LACTICACID  ------------------------------------------------------------------------------------------------------------------ No results for input(s): CHOL, HDL, LDLCALC, TRIG, CHOLHDL, LDLDIRECT in the last 72 hours.  Lab Results  Component Value Date   HGBA1C 5.9 (H) 01/02/2018   ------------------------------------------------------------------------------------------------------------------ No results for input(s): TSH, T4TOTAL, T3FREE, THYROIDAB in the last  72 hours.  Invalid input(s): FREET3 ------------------------------------------------------------------------------------------------------------------ No results for input(s): VITAMINB12, FOLATE, FERRITIN, TIBC, IRON, RETICCTPCT in the last 72 hours.  Coagulation profile  No results for input(s): INR, PROTIME in the last 168 hours.  No results for input(s): DDIMER in the last 72 hours.  Cardiac Enzymes  No results for input(s): CKMB, TROPONINI, MYOGLOBIN in the last 168 hours.  Invalid input(s): CK ------------------------------------------------------------------------------------------------------------------    Component Value Date/Time   BNP 1,059.7 (H) 01/02/2018 1527     Antibiotics: Anti-infectives (From admission, onward)   Start     Dose/Rate Route Frequency Ordered Stop   02/01/21 1505  ceFAZolin (ANCEF) 2-4 GM/100ML-% IVPB       Note to Pharmacy: 02/03/21   : cabinet override      02/01/21 1505 02/01/21 1516  Radiology Reports  No results found.    DVT prophylaxis: SCDs  Code Status: Full code  Family Communication: No family at bedside   Consultants:  Nephrology  Procedures:      Objective    Physical Examination:    General-appears in no acute distress  Heart-S1-S2, regular, no murmur auscultated  Lungs-clear to auscultation bilaterally, no wheezing or crackles auscultated  Abdomen-soft, nontender, no organomegaly  Extremities-no edema in the lower extremities  Neuro-alert, oriented x3, no focal deficit noted   Status is: Inpatient  Dispo: The patient is from: Home              Anticipated d/c is to: Home              Anticipated d/c date is: 02/14/2021              Patient currently not stable for discharge  Barrier to discharge-ongoing work-up for acute kidney injury, hemodialysis  COVID-19 Labs  No results for input(s): DDIMER, FERRITIN, LDH, CRP in the last 72 hours.  Lab Results  Component Value  Date   SARSCOV2NAA NEGATIVE 01/31/2021    Microbiology  No results found for this or any previous visit (from the past 240 hour(s)).           Meredeth Ide   [facility] If 7PM-7AM, please contact night-coverage at [email or web address], Office  [phone]   02/12/2021, 6:01 PM  LOS: 12 days

## 2021-02-12 NOTE — Progress Notes (Signed)
[facility] NEPHROLOG[facility] PROGRESS NOTE  Assessment/ Plan: Pt is a 40 y.o. yo male who was presented on 4/20 with acute uremic symptoms for about 4 days.  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  Presented with creatinine level 14-15.  He reported that he was trying to self dehydrated for the computations and he was using oral creatinine supplements.  Denies use of NSAIDs.  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  The patient was self dehydrating (for body building competition) in addition to the use of OTC diuretic pill, oral supplements including creatinine etc. No thrombocytopenia and peripheral smear without schistocytes ruled out TMA.  Final biopsy result pending  serology including ANA, ANCA, complements, hep B, hep C, HIV negative, protein electrophoresis unremarkable.  CT scan without hydronephrosis or renal mass.     Status post RIJ TDC placed by IR on 4/21 and started dialysis.  He has been tolerating dialysis well, last HD on 4/30 with 3 L UF, tolerated well.  Great UOP in pa st 24h, increasing.  K and HCO3 stable.  No HD indication today.  Cont daily assessments.    #Peripheral edema: Managing with dialysis.  Now with increased urine output the edema expect to improve.  #Metabolic acidosis improved with dialysis.  #Hyperkalemia resolved.  #Hyperphosphatemia: Off binders with P at target \ #Elevated liver enzymes, LFTs improving.  # Anemia:Iron saturation 21%.  Received IV iron and continue Aranesp. Monitor hemoglobin.  Subjective:   No interval events  GOod UOP  Updated pt and father joined by Video Chat  Good PO, no SOB  Remains edematous   Objective Vital signs in last 24 hours: Vitals:   02/11/21 2310 02/12/21 0240 02/12/21 0635 02/12/21 0834  BP: 130/62  130/76 134/69  Pulse: 75  61 65  Resp: 16  19 16   Temp:  99.1 F (37.3 C) 99.3 F (37.4 C) 99 F (37.2 C)   TempSrc: Oral  Oral   SpO2: 99%  100%   Weight:   88.9 kg   Height:       Weight change: -4.095 kg  Intake/Output Summary (Last 24 hours) at 02/12/2021 0937 Last data filed at 02/12/2021 0252 Gross per 24 hour  Intake 360 ml  Output 2850 ml  Net -2490 ml       Labs: Basic Metabolic Panel: Recent Labs  Lab 02/10/21 1050 02/11/21 0155 02/12/21 0311  NA 142 137 143  K 4.5 3.8 3.6  CL 102 101 105  CO2 28 25 26   GLUCOSE 99 95 122*  BUN 75* 56* 72*  CREATININE 9.16* 7.22* 7.38*  CALCIUM 8.7* 8.1* 8.8*  PHOS 2.3*  --   --    Liver Function Tests: Recent Labs  Lab 02/09/21 0332 02/10/21 1050 02/10/21 1742 02/11/21 0155  AST 59*  --  51* 42*  ALT 181*  --  151* 131*  ALKPHOS 71  --  84 62  BILITOT 1.1  --  0.9 0.8  PROT 5.3*  --  5.4* 5.0*  ALBUMIN 2.6* 2.7* 2.5* 2.5*   No results for input(s): LIPASE, AMYLASE in the last 168 hours. No results for input(s): AMMONIA in the last 168 hours. CBC: Recent Labs  Lab 02/05/21 1549 02/06/21 0303 02/07/21 0428 02/08/21 0248 02/09/21 0332 02/10/21 1050  WBC 4.6 4.7 6.5 8.0 8.7 9.9  NEUTROABS 2.9 2.7  --   --   --   --  HGB 9.8* 9.2* 10.5* 8.9* 9.1* 10.1*  HCT 29.2* 27.3* 31.9* 27.3* 27.6* 31.7*  MCV 93.9 93.2 96.1 97.8 97.2 99.4  PLT 117* 119* 145* 179 238 332   Cardiac Enzymes: Recent Labs  Lab 02/06/21 0303 02/07/21 0428 02/08/21 0248 02/10/21 1742  CKTOTAL 128 119 656* 79   CBG: No results for input(s): GLUCAP in the last 168 hours.  Iron Studies:  No results for input(s): IRON, TIBC, TRANSFERRIN, FERRITIN in the last 72 hours. Studies/Results: No results found.  Medications: Infusions:   Scheduled Medications: . Chlorhexidine Gluconate Cloth  6 each Topical Q0600  . Chlorhexidine Gluconate Cloth  6 each Topical Q0600  . darbepoetin (ARANESP) injection - DIALYSIS  100 mcg Intravenous Q Tue-HD    have reviewed scheduled and prn  medications.  Physical Exam: General: Lying on bed, not in distress Heart:RRR, s1s2 nl, no rubs Lungs: Clear b/l, no crackle Abdomen:soft, Non-tender, non-distended Extremities: Lower extremities edema 2+. Dialysis Access: RIJ TDC  Marytza Grandpre B Judy Pollman 02/12/2021,9:37 AM  LOS: 12 days

## 2021-02-13 LAB — BASIC METABOLIC PANEL
Anion gap: 9 (ref 5–15)
BUN: 74 mg/dL — ABNORMAL HIGH (ref 6–20)
CO2: 28 mmol/L (ref 22–32)
Calcium: 8.9 mg/dL (ref 8.9–10.3)
Chloride: 109 mmol/L (ref 98–111)
Creatinine, Ser: 6.13 mg/dL — ABNORMAL HIGH (ref 0.61–1.24)
GFR, Estimated: 11 mL/min — ABNORMAL LOW (ref >60)
Glucose, Bld: 91 mg/dL (ref 70–99)
Potassium: 3.6 mmol/L (ref 3.5–5.1)
Sodium: 146 mmol/L — ABNORMAL HIGH (ref 135–145)

## 2021-02-13 LAB — CBC
HCT: 29.1 % — ABNORMAL LOW (ref 39.0–52.0)
Hemoglobin: 9.1 g/dL — ABNORMAL LOW (ref 13.0–17.0)
MCH: 31.5 pg (ref 26.0–34.0)
MCHC: 31.3 g/dL (ref 30.0–36.0)
MCV: 100.7 fL — ABNORMAL HIGH (ref 80.0–100.0)
Platelets: 377 10*3/uL (ref 150–400)
RBC: 2.89 MIL/uL — ABNORMAL LOW (ref 4.22–5.81)
RDW: 16.6 % — ABNORMAL HIGH (ref 11.5–15.5)
WBC: 11.1 10*3/uL — ABNORMAL HIGH (ref 4.0–10.5)
nRBC: 0 % (ref 0.0–0.2)

## 2021-02-13 NOTE — Progress Notes (Signed)
[facility] PROGRESS NOTE  Assessment/ Plan: Pt is a 40 y.o. yo male who was presented on 4/20 with acute uremic symptoms for about 4 days.  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  Presented with creatinine level 14-15.  He reported that he was trying to self dehydrated for the computations and he was using oral creatinine supplements.  Denies use of NSAIDs.  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  The patient was self dehydrating (for body building competition) in addition to the use of OTC diuretic pill, oral supplements including creatinine etc. No thrombocytopenia and peripheral smear without schistocytes ruled out TMA.  Final biopsy result pending  serology including ANA, ANCA, complements, hep B, hep C, HIV negative, protein electrophoresis unremarkable.  CT scan without hydronephrosis or renal mass.     Status post RIJ TDC placed by IR on 4/21 and started dialysis.  He has been tolerating dialysis well, last HD on 4/30 with 3 L UF, tolerated well.  Great UOP in pa st 24h, increasing.  K and HCO3 stable.  SCr down appears to be recovering.  No HD indication today.  If SCr and BUN cont to improved tomorrow can DC [facility] and arrange outpt f/u.    #Peripheral edema:   Now with increased urine output the edema expect to improve.  #Metabolic acidosis: resolved.  #Hyperkalemia resolved.  #Hyperphosphatemia: Off binders with P at target \ #Elevated liver enzymes, LFTs improving.  # Anemia:Iron saturation 21%.  Received IV iron ESA.  ESA now on hold with GFR recovery. Monitor hemoglobin.  Subjective:   No interval events  SCr went down, bun stable  Great and inc'ing UOP  Mother in room, updated  Good PO, no SOB  Remains edematous   Objective Vital signs in last 24 hours: Vitals:   02/12/21 2314 02/13/21 0328 02/13/21 0618 02/13/21 1223  BP:  (!) 144/73 138/82  131/76  Pulse: 69 62  65  Resp: 15 16  16   Temp: 97.7 F (36.5 C) 99.1 F (37.3 C)  98.1 F (36.7 C)  TempSrc: Oral Oral  Oral  SpO2: 100% 100%  100%  Weight:   87.3 kg   Height:       Weight change: -1.588 kg  Intake/Output Summary (Last 24 hours) at 02/13/2021 1328 Last data filed at 02/13/2021 0706 Gross per 24 hour  Intake 840 ml  Output 4650 ml  Net -3810 ml       Labs: Basic Metabolic Panel: Recent Labs  Lab 02/10/21 1050 02/11/21 0155 02/12/21 0311 02/13/21 0306  NA 142 137 143 146*  K 4.5 3.8 3.6 3.6  CL 102 101 105 109  CO2 28 25 26 28   GLUCOSE 99 95 122* 91  BUN 75* 56* 72* 74*  CREATININE 9.16* 7.22* 7.38* 6.13*  CALCIUM 8.7* 8.1* 8.8* 8.9  PHOS 2.3*  --   --   --    Liver Function Tests: Recent Labs  Lab 02/09/21 0332 02/10/21 1050 02/10/21 1742 02/11/21 0155  AST 59*  --  51* 42*  ALT 181*  --  151* 131*  ALKPHOS 71  --  84 62  BILITOT 1.1  --  0.9 0.8  PROT 5.3*  --  5.4* 5.0*  ALBUMIN 2.6* 2.7* 2.5* 2.5*   No results for input(s): LIPASE, AMYLASE in the last 168 hours. No results for input(s): AMMONIA in the last 168 hours. CBC:  Recent Labs  Lab 02/07/21 0428 02/08/21 0248 02/09/21 0332 02/10/21 1050 02/13/21 0306  WBC 6.5 8.0 8.7 9.9 11.1*  HGB 10.5* 8.9* 9.1* 10.1* 9.1*  HCT 31.9* 27.3* 27.6* 31.7* 29.1*  MCV 96.1 97.8 97.2 99.4 100.7*  PLT 145* 179 238 332 377   Cardiac Enzymes: Recent Labs  Lab 02/07/21 0428 02/08/21 0248 02/10/21 1742  CKTOTAL 119 656* 79   CBG: No results for input(s): GLUCAP in the last 168 hours.  Iron Studies:  No results for input(s): IRON, TIBC, TRANSFERRIN, FERRITIN in the last 72 hours. Studies/Results: No results found.  Medications: Infusions:   Scheduled Medications: . Chlorhexidine Gluconate Cloth  6 each Topical Q0600  . Chlorhexidine Gluconate Cloth  6 each Topical Q0600    have reviewed scheduled and prn medications.  Physical Exam: General: Lying on  bed, not in distress Heart:RRR, s1s2 nl, no rubs Lungs: Clear b/l, no crackle Abdomen:soft, Non-tender, non-distended Extremities: Lower extremities edema 1+. Dialysis Access: RIJ TDC  Dalyce Renne B Rejina Odle 02/13/2021,1:28 PM  LOS: 13 days

## 2021-02-13 NOTE — Progress Notes (Signed)
[facility]  PROGRESS NOTE  Evan Vargas ATF:[id number] DOB: [birth year] DOA: 01/31/2021 PCP: Pcp, No   Brief HPI:   40 year old male with history of paroxysmal atrial fibrillation, not on chronic anticoagulation, chronic diastolic heart failure who was admitted to hospital on 01/31/2021 with acute kidney injury.  Patient presented to [facility] long ED with complaints of profound nausea, vomiting and diarrhea without any signs and symptoms of bleeding.  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  He cut down fluids while taking protein supplements.  He was found to be in acute kidney injury, nephrology was consulted patient was transferred to [facility] for dialysis due to significantly elevated creatinine and potassium.  Patient was started on dialysis, kidney function continued to improve but not back to baseline.  Nephrology following.  Planning to start outpatient hemodialysis.    Subjective   Patient seen and examined, urine output is increasing.  Creatinine is down to 6.13   Assessment/Plan:     1. Acute kidney injury-patient developed nausea and vomiting after eating burger, after he had bodybuilding competition.  He was trying to self dehydrate himself by taking Expel, OTC diuretic prior to competition.  Also takes creatine, protein, glutamine, salt substitute.  No significant NSAID exposure.  Patient was found to be in acute kidney injury with creatinine of 13.87.  Nephrology was consulted.  CT scan showed no hydronephrosis or renal mass.  .  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  Serology including ANCA, ANA, complements, hep B, hep C, HIV were negative.  Serum protein electrophoresis was unremarkable.  Patient Is Status Post Right IJ DDS Placed by IR on 4/21 and Started on Hemodialysis.  Patient Underwent Renal Biopsy on 02/07/2021.  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  Final result is currently pending.  As patient's creatinine is improving having urine output has significantly increased.  Urology is considering removing hemodialysis catheter tomorrow and discharge home with outpatient follow-up.   2. Hyperkalemia-Secondary to above, Resolved.  3. Elevated LFTs-patient had significantly elevated LFTs, likely from hypovolemia resulting in shock liver.  Hepatitis panel is negative, HIV negative.  No history of Tylenol use.  Patient was taking supplements including creatine, glutamine, protein, Expel.  This could also have been culprits and causing the transaminitis.  Right upper quadrant ultrasound showed diffuse abdominal ascites, right pleural effusion, fatty infiltration of liver.  LFTs are slowly improving.  He might need GI follow-up as outpatient.  4. Right pleural effusion/ascites-likely from volume overload due to acute kidney injury.  5. Thrombocytopenia-likely from rhabdomyolysis.  Resolved  6. Anion gap metabolic acidosis-secondary to acute kidney injury, rhabdomyolysis.  Resolved  7. Elevated CPK-very minimally elevated, improved.  8. Paroxysmal atrial fibrillation-patient has history of paroxysmal atrial fibrillation.  Currently sinus rhythm.  Not on anticoagulation. CHA2DS2VASc score of 1.   Scheduled medications:   . Chlorhexidine Gluconate Cloth  6 each Topical Q0600  . Chlorhexidine Gluconate Cloth  6 each Topical Q0600         Data Reviewed:   CBG:  No results for input(s): GLUCAP in the last 168 hours.  SpO2: 100 % O2 Flow Rate (L/min): 2 L/min    Vitals:   02/12/21 2314 02/13/21 0328 02/13/21 0618 02/13/21 1223  BP: (!) 144/73 138/82  131/76  Pulse: 69 62  65  Resp: 15 16  16   Temp: 97.7 F (36.5 C) 99.1 F (37.3 C)  98.1  F (36.7 C)  TempSrc: Oral Oral  Oral  SpO2: 100% 100%  100%  Weight:   87.3 kg   Height:         Intake/Output Summary (Last 24 hours) at 02/13/2021 1455 Last data filed  at 02/13/2021 0706 Gross per 24 hour  Intake 840 ml  Output 4650 ml  Net -3810 ml    05/01 1901 - 05/03 0700 In: 1800 [P.O.:1800] Out: 6675 [Urine:6675]  Filed Weights   02/11/21 0655 02/12/21 0635 02/13/21 0618  Weight: 89.6 kg 88.9 kg 87.3 kg    CBC:  Recent Labs  Lab 02/07/21 0428 02/08/21 0248 02/09/21 0332 02/10/21 1050 02/13/21 0306  WBC 6.5 8.0 8.7 9.9 11.1*  HGB 10.5* 8.9* 9.1* 10.1* 9.1*  HCT 31.9* 27.3* 27.6* 31.7* 29.1*  PLT 145* 179 238 332 377  MCV 96.1 97.8 97.2 99.4 100.7*  MCH 31.6 31.9 32.0 31.7 31.5  MCHC 32.9 32.6 33.0 31.9 31.3  RDW 16.5* 16.6* 16.2* 16.3* 16.6*    Complete metabolic panel:  Recent Labs  Lab 02/07/21 0428 02/08/21 0248 02/09/21 0332 02/10/21 1050 02/10/21 1742 02/11/21 0155 02/12/21 0311 02/13/21 0306  NA 135 137 138 142  --  137 143 146*  K 4.1 4.6 4.5 4.5  --  3.8 3.6 3.6  CL 98 101 101 102  --  101 105 109  CO2 26 23 27 28   --  25 26 28   GLUCOSE 87 127* 92 99  --  95 122* 91  BUN 53* 75* 61* 75*  --  56* 72* 74*  CREATININE 8.25* 10.03* 8.27* 9.16*  --  7.22* 7.38* 6.13*  CALCIUM 7.9* 7.9* 8.5* 8.7*  --  8.1* 8.8* 8.9  AST 93* 84* 59*  --  51* 42*  --   --   ALT 283* 214* 181*  --  151* 131*  --   --   ALKPHOS 72 85 71  --  84 62  --   --   BILITOT 1.4* 0.9 1.1  --  0.9 0.8  --   --   ALBUMIN 2.7* 2.4* 2.6* 2.7* 2.5* 2.5*  --   --     No results for input(s): LIPASE, AMYLASE in the last 168 hours.  No results for input(s): CRP, DDIMER, BNP, PROCALCITON, SARSCOV2NAA in the last 168 hours.  Invalid input(s): LACTICACID  ------------------------------------------------------------------------------------------------------------------ No results for input(s): CHOL, HDL, LDLCALC, TRIG, CHOLHDL, LDLDIRECT in the last 72 hours.  Lab Results  Component Value Date   HGBA1C 5.9 (H) 01/02/2018   ------------------------------------------------------------------------------------------------------------------ No  results for input(s): TSH, T4TOTAL, T3FREE, THYROIDAB in the last 72 hours.  Invalid input(s): FREET3 ------------------------------------------------------------------------------------------------------------------ No results for input(s): VITAMINB12, FOLATE, FERRITIN, TIBC, IRON, RETICCTPCT in the last 72 hours.  Coagulation profile  No results for input(s): INR, PROTIME in the last 168 hours.  No results for input(s): DDIMER in the last 72 hours.  Cardiac Enzymes  No results for input(s): CKMB, TROPONINI, MYOGLOBIN in the last 168 hours.  Invalid input(s): CK ------------------------------------------------------------------------------------------------------------------    Component Value Date/Time   BNP 1,059.7 (H) 01/02/2018 1527     Antibiotics: Anti-infectives (From admission, onward)   Start     Dose/Rate Route Frequency Ordered Stop   02/01/21 1505  ceFAZolin (ANCEF) 2-4 GM/100ML-% IVPB       Note to Pharmacy: 01/04/2018   : cabinet override      02/01/21 1505 02/01/21 1516       Radiology Reports  No results found.  DVT prophylaxis: SCDs  Code Status: Full code  Family Communication: No family at bedside   Consultants:  Nephrology  Procedures:      Objective    Physical Examination:    General-appears in no acute distress  Heart-S1-S2, regular, no murmur auscultated  Lungs-clear to auscultation bilaterally, no wheezing or crackles auscultated  Abdomen-soft, nontender, no organomegaly  Extremities-no edema in the lower extremities  Neuro-alert, oriented x3, no focal deficit noted   Status is: Inpatient  Dispo: The patient is from: Home              Anticipated d/c is to: Home              Anticipated d/c date is: 02/14/2021              Patient currently not stable for discharge  Barrier to discharge-ongoing work-up for acute kidney injury, hemodialysis  COVID-19 Labs  No results for input(s): DDIMER, FERRITIN,  LDH, CRP in the last 72 hours.  Lab Results  Component Value Date   SARSCOV2NAA NEGATIVE 01/31/2021    Microbiology  No results found for this or any previous visit (from the past 240 hour(s)).           Meredeth Ide   [facility] If 7PM-7AM, please contact night-coverage at [email or web address], Office  [phone]   02/13/2021, 2:55 PM  LOS: 13 days

## 2021-02-14 ENCOUNTER — Inpatient Hospital Stay (HOSPITAL_COMMUNITY): Payer: Self-pay

## 2021-02-14 HISTORY — PX: IR REMOVAL TUN CV CATH W/O FL: IMG2289

## 2021-02-14 LAB — CBC
HCT: 29.4 % — ABNORMAL LOW (ref 39.0–52.0)
Hemoglobin: 9.3 g/dL — ABNORMAL LOW (ref 13.0–17.0)
MCH: 31.8 pg (ref 26.0–34.0)
MCHC: 31.6 g/dL (ref 30.0–36.0)
MCV: 100.7 fL — ABNORMAL HIGH (ref 80.0–100.0)
Platelets: 354 10*3/uL (ref 150–400)
RBC: 2.92 MIL/uL — ABNORMAL LOW (ref 4.22–5.81)
RDW: 16.8 % — ABNORMAL HIGH (ref 11.5–15.5)
WBC: 9.1 10*3/uL (ref 4.0–10.5)
nRBC: 0 % (ref 0.0–0.2)

## 2021-02-14 LAB — COMPREHENSIVE METABOLIC PANEL
ALT: 110 U/L — ABNORMAL HIGH (ref 0–44)
AST: 37 U/L (ref 15–41)
Albumin: 2.7 g/dL — ABNORMAL LOW (ref 3.5–5.0)
Alkaline Phosphatase: 60 U/L (ref 38–126)
Anion gap: 9 (ref 5–15)
BUN: 71 mg/dL — ABNORMAL HIGH (ref 6–20)
CO2: 30 mmol/L (ref 22–32)
Calcium: 8.9 mg/dL (ref 8.9–10.3)
Chloride: 109 mmol/L (ref 98–111)
Creatinine, Ser: 4.77 mg/dL — ABNORMAL HIGH (ref 0.61–1.24)
GFR, Estimated: 15 mL/min — ABNORMAL LOW (ref >60)
Glucose, Bld: 94 mg/dL (ref 70–99)
Potassium: 4.1 mmol/L (ref 3.5–5.1)
Sodium: 148 mmol/L — ABNORMAL HIGH (ref 135–145)
Total Bilirubin: 1 mg/dL (ref 0.3–1.2)
Total Protein: 5.5 g/dL — ABNORMAL LOW (ref 6.5–8.1)

## 2021-02-14 NOTE — Progress Notes (Signed)
[facility] NEPHROLOGY PROGRESS NOTE  Assessment/ Plan: Pt is a 40 y.o. yo male who was presented on 4/20 with acute uremic symptoms for about 4 days.  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  Presented with creatinine level 14-15.  He reported that he was trying to self dehydrated for the computations and he was using oral creatinine supplements.  Denies use of NSAIDs.  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  The patient was self dehydrating (for body building competition) in addition to the use of OTC diuretic pill, oral supplements including creatinine etc. No thrombocytopenia and peripheral smear without schistocytes ruled out TMA.  Final biopsy result pending  serology including ANA, ANCA, complements, hep B, hep C, HIV negative, protein electrophoresis unremarkable.  CT scan without hydronephrosis or renal mass.     Status post RIJ TDC placed by IR on 4/21 and started dialysis.  He has been tolerating dialysis well, last HD on 4/30 with 3 L UF, tolerated well.  Great UOP in pa st 24h, GFR is clearly recovering. OK for DC Today AFTER removal of TDC.  Will make f/u appt at [facility].    #Peripheral edema:   Now with increased urine output the edema will improve.  #Metabolic acidosis: resolved.  #Hyperkalemia resolved.  #Hyperphosphatemia: Off binders with P at target \ #Elevated liver enzymes, LFTs improving.  # Anemia:Iron saturation 21%.  Received IV iron ESA.  ESA now on hold with GFR recovery. Monitor hemoglobin.  Subjective:   No interval events  Great UOP  SCr down 6.1 to 4.8, BUN 71, K 4.1   Objective Vital signs in last 24 hours: Vitals:   02/13/21 0618 02/13/21 1223 02/13/21 2018 02/14/21 0705  BP:  131/76 (!) 142/74 130/75  Pulse:  65 77 68  Resp:  16 16 18   Temp:  98.1 F (36.7 C) 97.9 F (36.6 C) 98.3 F (36.8 C)  TempSrc:  Oral Oral  Oral  SpO2:  100% 100% 100%  Weight: 87.3 kg   85.5 kg  Height:       Weight change:   Intake/Output Summary (Last 24 hours) at 02/14/2021 1308 Last data filed at 02/14/2021 1056 Gross per 24 hour  Intake 618 ml  Output 4325 ml  Net -3707 ml       Labs: Basic Metabolic Panel: Recent Labs  Lab 02/10/21 1050 02/11/21 0155 02/12/21 0311 02/13/21 0306 02/14/21 0533  NA 142   < > 143 146* 148*  K 4.5   < > 3.6 3.6 4.1  CL 102   < > 105 109 109  CO2 28   < > 26 28 30   GLUCOSE 99   < > 122* 91 94  BUN 75*   < > 72* 74* 71*  CREATININE 9.16*   < > 7.38* 6.13* 4.77*  CALCIUM 8.7*   < > 8.8* 8.9 8.9  PHOS 2.3*  --   --   --   --    < > = values in this interval not displayed.   Liver Function Tests: Recent Labs  Lab 02/10/21 1742 02/11/21 0155 02/14/21 0533  AST 51* 42* 37  ALT 151* 131* 110*  ALKPHOS 84 62 60  BILITOT 0.9 0.8 1.0  PROT 5.4* 5.0* 5.5*  ALBUMIN 2.5* 2.5* 2.7*   No results for input(s): LIPASE, AMYLASE in the last 168 hours. No results for input(s): AMMONIA in the last  168 hours. CBC: Recent Labs  Lab 02/08/21 0248 02/09/21 0332 02/10/21 1050 02/13/21 0306 02/14/21 0533  WBC 8.0 8.7 9.9 11.1* 9.1  HGB 8.9* 9.1* 10.1* 9.1* 9.3*  HCT 27.3* 27.6* 31.7* 29.1* 29.4*  MCV 97.8 97.2 99.4 100.7* 100.7*  PLT 179 238 332 377 354   Cardiac Enzymes: Recent Labs  Lab 02/08/21 0248 02/10/21 1742  CKTOTAL 656* 79   CBG: No results for input(s): GLUCAP in the last 168 hours.  Iron Studies:  No results for input(s): IRON, TIBC, TRANSFERRIN, FERRITIN in the last 72 hours. Studies/Results: No results found.  Medications: Infusions:   Scheduled Medications: . Chlorhexidine Gluconate Cloth  6 each Topical Q0600  . Chlorhexidine Gluconate Cloth  6 each Topical Q0600    have reviewed scheduled and prn medications.  Physical Exam: General: Lying on bed, not in distress Heart:RRR, s1s2 nl, no rubs Lungs: Clear b/l, no crackle Abdomen:soft,  Non-tender, non-distended Extremities: Lower extremities edema trace. Dialysis Access: RIJ TDC  Raahi Korber B Abdulloh Ullom 02/14/2021,1:08 PM  LOS: 14 days

## 2021-02-14 NOTE — Discharge Summary (Signed)
Physician Discharge Summary  Evan Vargas PQZ:[id number] DOB: 04-Jun-1981 DOA: 01/31/2021  PCP: Oneita Hurt, No  Admit date: 01/31/2021 Discharge date: 02/14/2021  Admitted From: Home.  Disposition:  Home.   Recommendations for Outpatient Follow-up:  1. Follow up with PCP in 1-2 weeks 2. Please obtain BMP/CBC in one week 3. Please follow up with nephrology in one week.  4. Please follow up with Gastroenterology for evaluation of ascites.   Discharge Condition: stable.  CODE STATUS: full code.  Diet recommendation: Heart Healthy   Brief/Interim Summary: 40 year old male with history of paroxysmal atrial fibrillation, not on chronic anticoagulation, chronic diastolic heart failure who was admitted to hospital on 01/31/2021 with acute kidney injury.  Patient presented to [facility] long ED with complaints of profound nausea, vomiting and diarrhea without any signs and symptoms of bleeding.  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  He cut down fluids while taking protein supplements.  He was found to be in acute kidney injury, nephrology was consulted patient was transferred to [facility] for dialysis due to significantly elevated creatinine and potassium.  Patient was started on dialysis, kidney function continued to improve . Nephrology recommends removal of the HD catheter and no indication of HD at this time.  Outpatient follo wup with nephrology will be scheduled by [facility].   Discharge Diagnoses:  Principal Problem:   Acute renal failure (ARF) (HCC) Active Problems:   Hyperkalemia   High anion gap metabolic acidosis   Nausea & vomiting   Diarrhea   Transaminitis   Hyperbilirubinemia   Paroxysmal atrial fibrillation (HCC)   1. Acute kidney injury-patient developed nausea and vomiting after eating burger, after he had bodybuilding competition.  He was trying to self dehydrate himself by taking Expel, OTC diuretic prior to competition.   Also takes creatine, protein, glutamine, salt substitute.  No significant NSAID exposure.  Patient was found to be in acute kidney injury with creatinine of 13.87.  Nephrology was consulted.  CT scan showed no hydronephrosis or renal mass.  .  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  Serology including ANCA, ANA, complements, hep B, hep C, HIV were negative.  Serum protein electrophoresis was unremarkable.  Patient Is Status Post Right IJ DDS Placed by IR on 4/21 and Started on Hemodialysis.  Patient Underwent Renal Biopsy on 02/07/2021.  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  As patient's creatinine is improving having urine output has significantly increased.  nephrology recommends removing HD catheter and discharge home today with follow up with nephrology as outpatient.   2. Hyperkalemia-Secondary to above, Resolved.  3. Elevated LFTs-patient had significantly elevated LFTs, likely from hypovolemia resulting in shock liver.  Hepatitis panel is negative, HIV negative.  No history of Tylenol use.  Patient was taking supplements including creatine, glutamine, protein, Expel.  This could also have been culprits and causing the transaminitis.  Right upper quadrant ultrasound showed diffuse abdominal ascites, right pleural effusion, fatty infiltration of liver.  LFTs are slowly improving. He was recommended to follo wup with PCP in one week, with possible referral to GI if his symptoms persistent.   4. Right pleural effusion/ascites-likely from volume overload due to acute kidney injury.  5. Thrombocytopenia-likely from rhabdomyolysis.  Resolved  6. Anion gap metabolic acidosis-secondary to acute kidney injury, rhabdomyolysis.  Resolved  7. Elevated CPK-very minimally elevated, improved.  8. Paroxysmal atrial fibrillation-patient has history of paroxysmal atrial fibrillation.  Currently sinus  rhythm.  Not on anticoagulation.  CHA2DS2VASc score of 1.   Discharge Instructions  Discharge Instructions    Diet - low sodium heart healthy   Complete by: As directed    Increase activity slowly   Complete by: As directed    No wound care   Complete by: As directed      Allergies as of 02/14/2021   No Known Allergies     Medication List    STOP taking these medications   acetaminophen 500 MG tablet Commonly known as: TYLENOL   apixaban 5 MG Tabs tablet Commonly known as: ELIQUIS   Creatine Powd   Glutamine Powd   metoprolol succinate 25 MG 24 hr tablet Commonly known as: TOPROL-XL   multivitamin with minerals Tabs tablet     TAKE these medications   ZINC PO Take 1 tablet by mouth daily.       Follow-up Information    primary care physician. Schedule an appointment as soon as possible for a visit in 1 week(s).              No Known Allergies  Consultations: Nephrology.   Procedures/Studies: CT ABDOMEN PELVIS WO CONTRAST  Result Date: 01/31/2021 CLINICAL DATA:  Acute abdominal pain for several days EXAM: CT ABDOMEN AND PELVIS WITHOUT CONTRAST TECHNIQUE: Multidetector CT imaging of the abdomen and pelvis was performed following the standard protocol without IV contrast. COMPARISON:  None. FINDINGS: Lower chest: Small bilateral pleural effusions and mild dependent atelectasis. Hepatobiliary: Liver is within normal limits. Gallbladder is well distended with dependent density likely related to sludge. Pericholecystic fluid is noted although ascites is seen. Pancreas: Unremarkable. No pancreatic ductal dilatation or surrounding inflammatory changes. Spleen: Normal in size without focal abnormality. Adrenals/Urinary Tract: Adrenal glands are within normal limits. Kidneys show no renal calculi or definitive obstructive changes. Bladder is partially distended. Stomach/Bowel: Colon shows no obstructive or inflammatory changes. The appendix is not well appreciated although no inflammatory changes are  seen. Vascular/Lymphatic: No significant vascular findings are present. No enlarged abdominal or pelvic lymph nodes. Reproductive: Prostate is unremarkable. Other: Mild diffuse ascites is noted throughout the abdomen. Some generalized edema within the mesenteric fat is noted as well likely related to third spacing. Musculoskeletal: No acute or significant osseous findings. IMPRESSION: Small effusions and dependent atelectatic changes without focal confluent infiltrate. Mild ascites with reactive changes in the abdomen and pelvis. Changes are likely related to the patient's known chronic congestive failure. Gallbladder is well distended with pericholecystic fluid likely related to the underlying ascites. The need for further evaluation can be determined on a clinical basis. Electronically Signed   By: Alcide CleverMark  Lukens M.D.   On: 01/31/2021 20:46   US BIOPSY (KIDNEY)  Result Date: 02/07/2021 INDICATION: Acute kidney injury EXAM: ULTRASOUND RANDOM LEFT RENAL CORE BIOPSY MEDICATIONS: 1% lidocaine local ANESTHESIA/SEDATION: Moderate (conscious) sedation was employed during this procedure. A total of Versed 2.0 mg and Fentanyl 75 mcg was administered intravenously. Moderate Sedation Time: 14 minutes. The patient's level of consciousness and vital signs were monitored continuously by radiology nursing throughout the procedure under my direct supervision. FLUOROSCOPY TIME:  Fluoroscopy Time: None. COMPLICATIONS: None immediate. PROCEDURE: Informed written consent was obtained from the patient after a thorough discussion of the procedural risks, benefits and alternatives. All questions were addressed. Maximal Sterile Barrier Technique was utilized including caps, mask, sterile gowns, sterile gloves, sterile drape, hand hygiene and skin antiseptic. A timeout was performed prior to the initiation of the procedure. Previous imaging reviewed. Preliminary  ultrasound performed. The left kidney lower pole was localized and marked  for biopsy. Kidneys are echogenic compatible with medical renal disease. Diffuse soft tissue edema noted as well as trace perinephric free fluid. Under sterile conditions and local anesthesia, a 15 gauge coaxial guide was advanced to the left kidney lower pole. Needle position confirmed with ultrasound. Images obtained for documentation. 2 16 gauge core biopsies obtained. Samples were intact and non fragmented. These were placed in saline. Needle tract occluded with Gel-Foam. Postprocedure imaging demonstrates no hemorrhage or hematoma. Patient tolerated biopsy well. IMPRESSION: Successful ultrasound left kidney 16 gauge core biopsy Electronically Signed   By: Judie Petit.  Shick M.D.   On: 02/07/2021 10:06   IR TUNNELED CENTRAL VENOUS CATHETER PLACEMENT  Result Date: 02/01/2021 INDICATION: 89 year old with acute kidney injury. Catheter needed for hemodialysis. EXAM: FLUOROSCOPIC AND ULTRASOUND GUIDED PLACEMENT OF A TUNNELED DIALYSIS CATHETER Physician: Rachelle Hora. Lowella Dandy, MD MEDICATIONS: Ancef 2 g; The antibiotic was administered within an appropriate time interval prior to skin puncture. ANESTHESIA/SEDATION: Versed 0.0 mg IV; Fentanyl 50 mcg IV; Moderate Sedation Time:  24 minutes The patient was continuously monitored during the procedure by the interventional radiology nurse under my direct supervision. FLUOROSCOPY TIME:  Fluoroscopy Time: 2 minutes, 24 seconds, 10 mGy COMPLICATIONS: None immediate. PROCEDURE: The procedure was explained to the patient. The risks and benefits of the procedure were discussed and the patient's questions were addressed. Informed consent was obtained from the patient. The patient was placed supine on the interventional table. Ultrasound confirmed a patent right internal jugular vein. Ultrasound images were obtained for documentation. The right neck and chest was prepped and draped in a sterile fashion. Maximal barrier sterile technique was utilized including caps, mask, sterile gowns, sterile  gloves, sterile drape, hand hygiene and skin antiseptic. The right neck was anesthetized with 1% lidocaine. A small incision was made with #11 blade scalpel. A 21 gauge needle directed into the right internal jugular vein with ultrasound guidance. A micropuncture dilator set was placed. A 19 cm tip to cuff Palindrome catheter was selected. The skin below the right clavicle was anesthetized and a small incision was made with an #11 blade scalpel. A subcutaneous tunnel was formed to the vein dermatotomy site. The catheter was brought through the tunnel. Micropuncture catheter was removed over a superstiff Amplatz wire. The vein dermatotomy site was dilated to accommodate a peel-away sheath. The catheter was placed through the peel-away sheath and directed into the central venous structures. The tip of the catheter was placed at the superior cavoatrial junction with fluoroscopy. Fluoroscopic images were obtained for documentation. Both lumens were found to aspirate and flush well. The proper amount of heparin was flushed in both lumens. The vein dermatotomy site was closed using a single layer of absorbable suture and Dermabond. The catheter was secured to the skin using Prolene suture. IMPRESSION: Successful placement of a right jugular tunneled dialysis catheter using ultrasound and fluoroscopic guidance. Electronically Signed   By: Richarda Overlie M.D.   On: 02/01/2021 16:33   US Abdomen Limited RUQ (LIVER/GB)  Result Date: 02/06/2021 CLINICAL DATA:  Elevated liver enzymes. EXAM: ULTRASOUND ABDOMEN LIMITED RIGHT UPPER QUADRANT COMPARISON:  CT 01/31/2021 FINDINGS: Gallbladder: No gallstones or wall thickening visualized. No sonographic Murphy sign noted by sonographer. Common bile duct: Diameter: 3.5 mm, normal Liver: Increased parenchymal echotexture suggesting fatty infiltration. No focal liver lesions. Portal vein is patent on color Doppler imaging with normal direction of blood flow towards the liver. Other:  Diffuse abdominal ascites.  Right pleural effusion. IMPRESSION: Diffuse abdominal ascites. Right pleural effusion. Fatty infiltration of the liver. No evidence of cholelithiasis or cholecystitis. Electronically Signed   By: Burman Nieves M.D.   On: 02/06/2021 01:49       Subjective: No chest pain or sob, no new complaints.   Discharge Exam: Vitals:   02/13/21 2018 02/14/21 0705  BP: (!) 142/74 130/75  Pulse: 77 68  Resp: 16 18  Temp: 97.9 F (36.6 C) 98.3 F (36.8 C)  SpO2: 100% 100%   Vitals:   02/13/21 0618 02/13/21 1223 02/13/21 2018 02/14/21 0705  BP:  131/76 (!) 142/74 130/75  Pulse:  65 77 68  Resp:  16 16 18   Temp:  98.1 F (36.7 C) 97.9 F (36.6 C) 98.3 F (36.8 C)  TempSrc:  Oral Oral Oral  SpO2:  100% 100% 100%  Weight: 87.3 kg   85.5 kg  Height:        General: Pt is alert, awake, not in acute distress Cardiovascular: RRR, S1/S2 +, no rubs, no gallops Respiratory: CTA bilaterally, no wheezing, no rhonchi Abdominal: Soft, NT, ND, bowel sounds + Extremities: no edema, no cyanosis    The results of significant diagnostics from this hospitalization (including imaging, microbiology, ancillary and laboratory) are listed below for reference.     Microbiology: No results found for this or any previous visit (from the past 240 hour(s)).   Labs: BNP (last 3 results) No results for input(s): BNP in the last 8760 hours. Basic Metabolic Panel: Recent Labs  Lab 02/10/21 1050 02/11/21 0155 02/12/21 0311 02/13/21 0306 02/14/21 0533  NA 142 137 143 146* 148*  K 4.5 3.8 3.6 3.6 4.1  CL 102 101 105 109 109  CO2 28 25 26 28 30   GLUCOSE 99 95 122* 91 94  BUN 75* 56* 72* 74* 71*  CREATININE 9.16* 7.22* 7.38* 6.13* 4.77*  CALCIUM 8.7* 8.1* 8.8* 8.9 8.9  PHOS 2.3*  --   --   --   --    Liver Function Tests: Recent Labs  Lab 02/08/21 0248 02/09/21 0332 02/10/21 1050 02/10/21 1742 02/11/21 0155 02/14/21 0533  AST 84* 59*  --  51* 42* 37  ALT 214*  181*  --  151* 131* 110*  ALKPHOS 85 71  --  84 62 60  BILITOT 0.9 1.1  --  0.9 0.8 1.0  PROT 4.9* 5.3*  --  5.4* 5.0* 5.5*  ALBUMIN 2.4* 2.6* 2.7* 2.5* 2.5* 2.7*   No results for input(s): LIPASE, AMYLASE in the last 168 hours. No results for input(s): AMMONIA in the last 168 hours. CBC: Recent Labs  Lab 02/08/21 0248 02/09/21 0332 02/10/21 1050 02/13/21 0306 02/14/21 0533  WBC 8.0 8.7 9.9 11.1* 9.1  HGB 8.9* 9.1* 10.1* 9.1* 9.3*  HCT 27.3* 27.6* 31.7* 29.1* 29.4*  MCV 97.8 97.2 99.4 100.7* 100.7*  PLT 179 238 332 377 354   Cardiac Enzymes: Recent Labs  Lab 02/08/21 0248 02/10/21 1742  CKTOTAL 656* 79   BNP: Invalid input(s): POCBNP CBG: No results for input(s): GLUCAP in the last 168 hours. D-Dimer No results for input(s): DDIMER in the last 72 hours. Hgb A1c No results for input(s): HGBA1C in the last 72 hours. Lipid Profile No results for input(s): CHOL, HDL, LDLCALC, TRIG, CHOLHDL, LDLDIRECT in the last 72 hours. Thyroid function studies No results for input(s): TSH, T4TOTAL, T3FREE, THYROIDAB in the last 72 hours.  Invalid input(s): FREET3 Anemia work up No results for input(s): VITAMINB12,  FOLATE, FERRITIN, TIBC, IRON, RETICCTPCT in the last 72 hours. Urinalysis    Component Value Date/Time   COLORURINE YELLOW 02/05/2021 1210   APPEARANCEUR CLOUDY (A) 02/05/2021 1210   LABSPEC 1.009 02/05/2021 1210   PHURINE 6.0 02/05/2021 1210   GLUCOSEU NEGATIVE 02/05/2021 1210   HGBUR LARGE (A) 02/05/2021 1210   BILIRUBINUR NEGATIVE 02/05/2021 1210   KETONESUR NEGATIVE 02/05/2021 1210   PROTEINUR 100 (A) 02/05/2021 1210   NITRITE NEGATIVE 02/05/2021 1210   LEUKOCYTESUR LARGE (A) 02/05/2021 1210   Sepsis Labs Invalid input(s): PROCALCITONIN,  WBC,  LACTICIDVEN Microbiology No results found for this or any previous visit (from the past 240 hour(s)).   Time coordinating discharge: 36 minutes.   SIGNED:   Kathlen Mody, MD  [facility] 02/14/2021,  12:46 PM

## 2021-02-14 NOTE — Procedures (Signed)
PROCEDURE SUMMARY:  Successful removal of right IJ tunneled HD catheter, removed intact. No immediate complications.  EBL < 2 mL. Pressure dressing applied to site. Patient tolerated well.   Discharge instructions: 1- Ok to shower 48 hours post-removal. 2- No submerging (swimming, bathing) for 7 days post-removal. 3- Ok to remove bandage after first shower, no further dressing changes needed- ensure area remains clean and dry until fully healed.  Please see imaging section of Epic for full dictation.   Gordy Councilman Vertie Dibbern PA-C 02/14/2021 2:07 PM

## 2021-02-14 NOTE — Plan of Care (Signed)
  Problem: Education: Goal: Knowledge of General Education information will improve Description: Including pain rating scale, medication(s)/side effects and non-pharmacologic comfort measures Outcome: Progressing   Problem: Health Behavior/Discharge Planning: Goal: Ability to manage health-related needs will improve Outcome: Progressing   Problem: Clinical Measurements: Goal: Ability to maintain clinical measurements within normal limits will improve Outcome: Progressing Goal: Will remain free from infection Outcome: Progressing Goal: Diagnostic test results will improve Outcome: Progressing Goal: Respiratory complications will improve Outcome: Progressing Goal: Cardiovascular complication will be avoided Outcome: Progressing   Problem: Activity: Goal: Risk for activity intolerance will decrease Outcome: Progressing   Problem: Nutrition: Goal: Adequate nutrition will be maintained Outcome: Progressing   Problem: Coping: Goal: Level of anxiety will decrease Outcome: Progressing   Problem: Elimination: Goal: Will not experience complications related to bowel motility Outcome: Progressing Goal: Will not experience complications related to urinary retention Outcome: Progressing   Problem: Pain Managment: Goal: General experience of comfort will improve Outcome: Progressing   Problem: Safety: Goal: Ability to remain free from injury will improve Outcome: Progressing   Problem: Skin Integrity: Goal: Risk for impaired skin integrity will decrease Outcome: Progressing   Problem: Education: Goal: Knowledge of disease and its progression will improve Outcome: Progressing   Problem: Health Behavior/Discharge Planning: Goal: Ability to manage health-related needs will improve Outcome: Progressing   Problem: Clinical Measurements: Goal: Complications related to the disease process or treatment will be avoided or minimized Outcome: Progressing Goal: Dialysis access  will remain free of complications Outcome: Progressing   Problem: Activity: Goal: Activity intolerance will improve Outcome: Progressing   Problem: Fluid Volume: Goal: Fluid volume balance will be maintained or improved Outcome: Progressing   Problem: Nutritional: Goal: Ability to make appropriate dietary choices will improve Outcome: Progressing   Problem: Respiratory: Goal: Respiratory symptoms related to disease process will be avoided Outcome: Progressing   Problem: Self-Concept: Goal: Body image disturbance will be avoided or minimized Outcome: Progressing   Problem: Urinary Elimination: Goal: Progression of disease will be identified and treated Outcome: Progressing   Problem: Education: Goal: Knowledge of disease and its progression will improve Outcome: Progressing   Problem: Health Behavior/Discharge Planning: Goal: Ability to manage health-related needs will improve Outcome: Progressing   Problem: Clinical Measurements: Goal: Complications related to the disease process or treatment will be avoided or minimized Outcome: Progressing Goal: Dialysis access will remain free of complications Outcome: Progressing   Problem: Activity: Goal: Activity intolerance will improve Outcome: Progressing   Problem: Fluid Volume: Goal: Fluid volume balance will be maintained or improved Outcome: Progressing   Problem: Nutritional: Goal: Ability to make appropriate dietary choices will improve Outcome: Progressing   Problem: Respiratory: Goal: Respiratory symptoms related to disease process will be avoided Outcome: Progressing   Problem: Self-Concept: Goal: Body image disturbance will be avoided or minimized Outcome: Progressing   Problem: Urinary Elimination: Goal: Progression of disease will be identified and treated Outcome: Progressing   

## 2021-02-14 NOTE — Progress Notes (Signed)
Renal Navigator notified by Nephrologist/Dr. Marisue Humble that patient has had renal recovery with no further need for HD at this time. Outpatient HD referral for AKI treatment has been cancelled and Navigator is signing off. Navigator has updated TOC CSW/A. Workman.  Barbee [place], [place] Renal Navigator [phone]

## 2021-02-20 LAB — SURGICAL PATHOLOGY

## 2021-02-23 ENCOUNTER — Encounter (HOSPITAL_COMMUNITY): Payer: Self-pay

## 2021-03-28 NOTE — Progress Notes (Deleted)
Patient ID: Evan Vargas, male   DOB: November 11, 1980, 40 y.o.   MRN: [id number]   After hospitalization 4/20-02/14/2021 Recommendations for Outpatient Follow-up:  Follow up with PCP in 1-2 weeks Please obtain BMP/CBC in one week 3. Please follow up with nephrology in one week. 4. Please follow up with Gastroenterology for evaluation of ascites.    Discharge Condition: stable.  CODE STATUS: full code.  Diet recommendation: Heart Healthy   Brief/Interim Summary: 40 year old male with history of paroxysmal atrial fibrillation, not on chronic anticoagulation, chronic diastolic heart failure who was admitted to hospital on 01/31/2021 with acute kidney injury.  Patient presented to [facility] long ED with complaints of profound nausea, vomiting and diarrhea without any signs and symptoms of bleeding.  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  He cut down fluids while taking protein supplements.  He was found to be in acute kidney injury, nephrology was consulted patient was transferred to [facility] for dialysis due to significantly elevated creatinine and potassium.  Patient was started on dialysis, kidney function continued to improve . Nephrology recommends removal of the HD catheter and no indication of HD at this time. Outpatient follo wup with nephrology will be scheduled by [facility].    Discharge Diagnoses:  Principal Problem:   Acute renal failure (ARF) (HCC) Active Problems:   Hyperkalemia   High anion gap metabolic acidosis   Nausea & vomiting   Diarrhea   Transaminitis   Hyperbilirubinemia   Paroxysmal atrial fibrillation (HCC)     Acute kidney injury-patient developed nausea and vomiting after eating burger, after he had bodybuilding competition.  He was trying to self dehydrate himself by taking Expel, OTC diuretic prior to competition.  Also takes creatine, protein, glutamine, salt substitute.  No significant NSAID exposure.   Patient was found to be in acute kidney injury with creatinine of 13.87.  Nephrology was consulted.  CT scan showed no hydronephrosis or renal mass.  .  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  Serology including ANCA, ANA, complements, hep B, hep C, HIV were negative.  Serum protein electrophoresis was unremarkable.  Patient Is Status Post Right IJ DDS Placed by IR on 4/21 and Started on Hemodialysis.  Patient Underwent Renal Biopsy on 02/07/2021.  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  As patient's creatinine is improving having urine output has significantly increased.  nephrology recommends removing HD catheter and discharge home today with follow up with nephrology as outpatient.    Hyperkalemia-Secondary to above, Resolved.   Elevated LFTs-patient had significantly elevated LFTs, likely from hypovolemia resulting in shock liver.  Hepatitis panel is negative, HIV negative.  No history of Tylenol use.  Patient was taking supplements including creatine, glutamine, protein, Expel.  This could also have been culprits and causing the transaminitis.  Right upper quadrant ultrasound showed diffuse abdominal ascites, right pleural effusion, fatty infiltration of liver.  LFTs are slowly improving. He was recommended to follo wup with PCP in one week, with possible referral to GI if his symptoms persistent.    Right pleural effusion/ascites-likely from volume overload due to acute kidney injury.   Thrombocytopenia-likely from rhabdomyolysis.  Resolved   Anion gap metabolic acidosis-secondary to acute kidney injury, rhabdomyolysis.  Resolved   Elevated CPK-very minimally elevated, improved.   Paroxysmal atrial fibrillation-patient has history of paroxysmal atrial fibrillation.  Currently sinus rhythm.  Not on anticoagulation. CHA2DS2VASc score of 1

## 2021-03-29 ENCOUNTER — Inpatient Hospital Stay: Payer: Self-pay | Admitting: Physician Assistant

## 2022-10-30 IMAGING — CT CT ABD-PELV W/O CM
2 of 4 series · 16 of 46 positions shown, 18 images · non-contrast
Comparison: None.

CLINICAL DATA: Acute abdominal pain for several days

EXAM:
CT ABDOMEN AND PELVIS WITHOUT CONTRAST
TECHNIQUE: Multidetector CT imaging of the abdomen and pelvis was performed
following the standard protocol without IV contrast.

[Series 2: axial st · axial · 0.92mm/px · z∈[+944,+1399]mm · 13 of 103 slices shown, 15 images]
[im 6/103  soft-tissue]
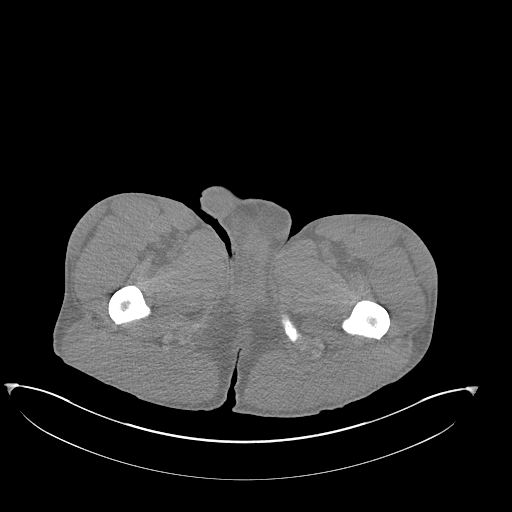
[im 6/103  bone]
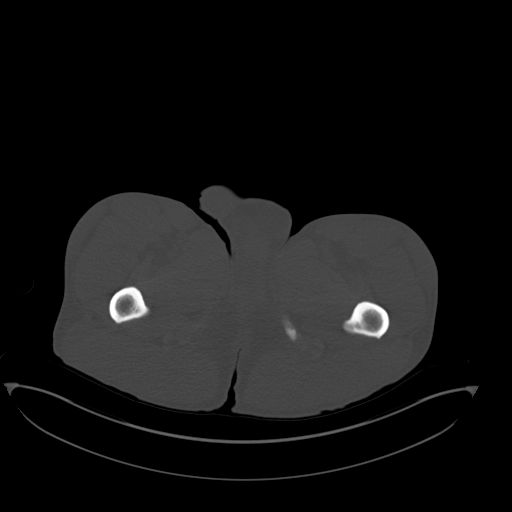
[im 16/103  soft-tissue]
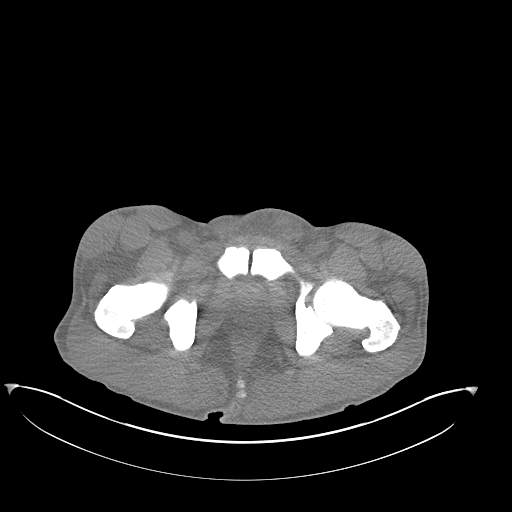
[im 21/103  soft-tissue]
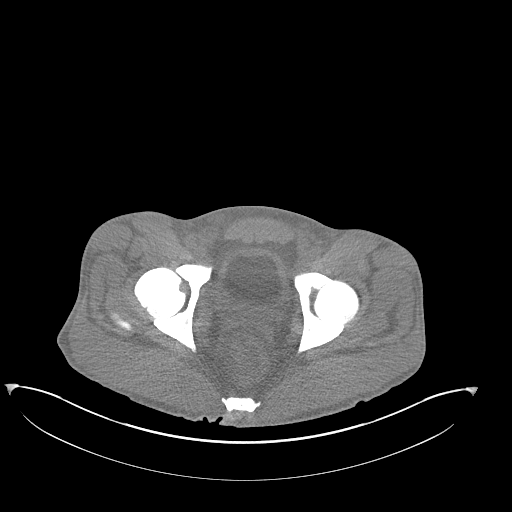
[im 31/103  soft-tissue]
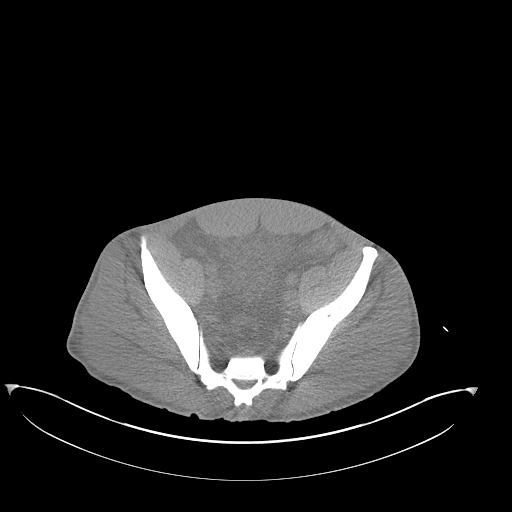
[im 36/103  soft-tissue]
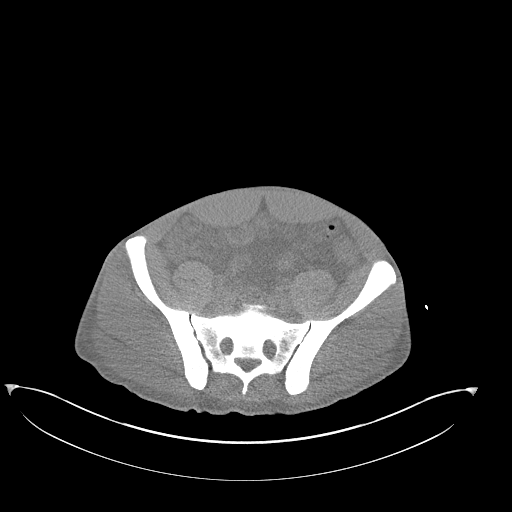
[im 46/103  soft-tissue]
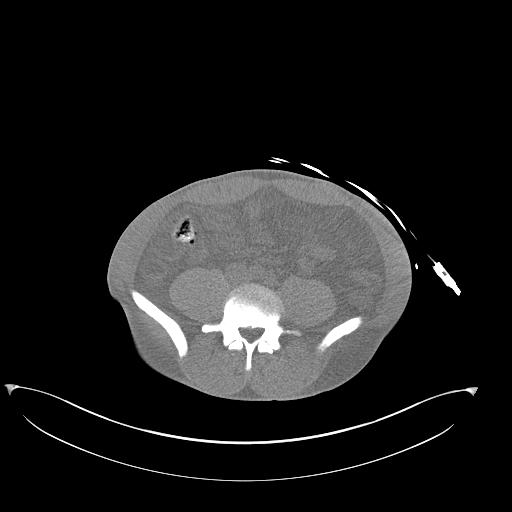
[im 52/103  soft-tissue]
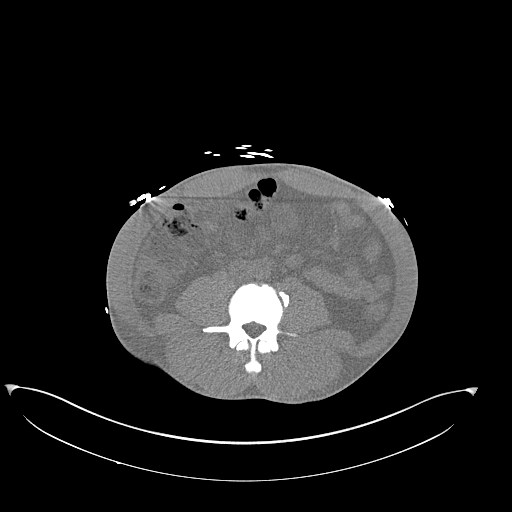
[im 57/103  soft-tissue]
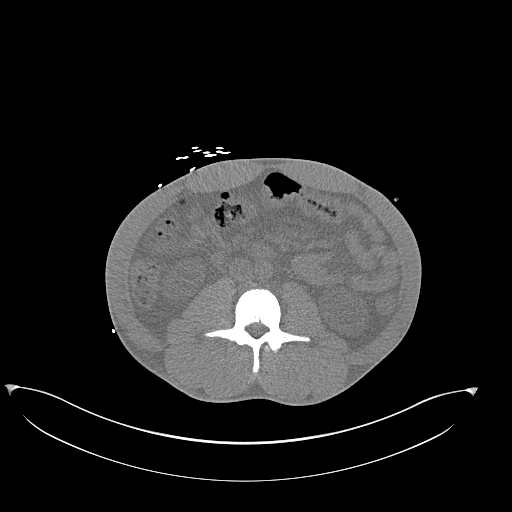
[im 67/103  soft-tissue]
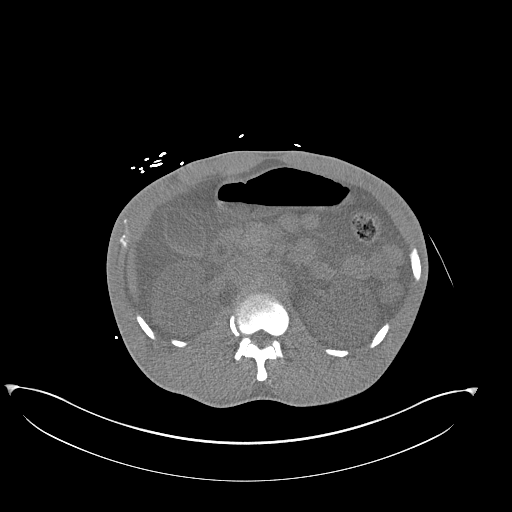
[im 67/103  bone]
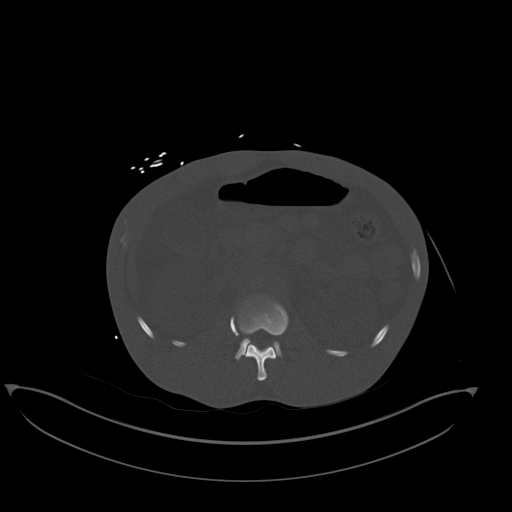
[im 72/103  soft-tissue]
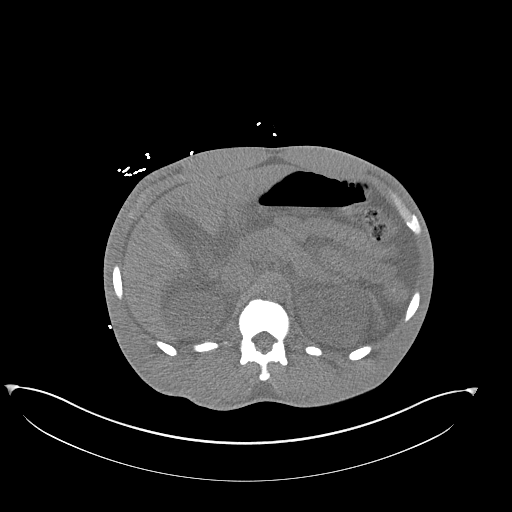
[im 82/103  soft-tissue]
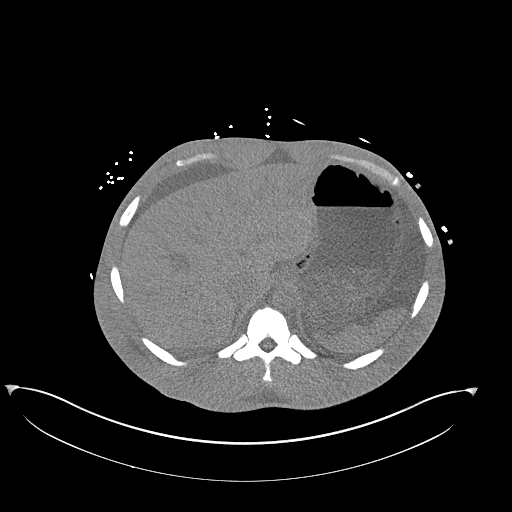
[im 87/103  soft-tissue]
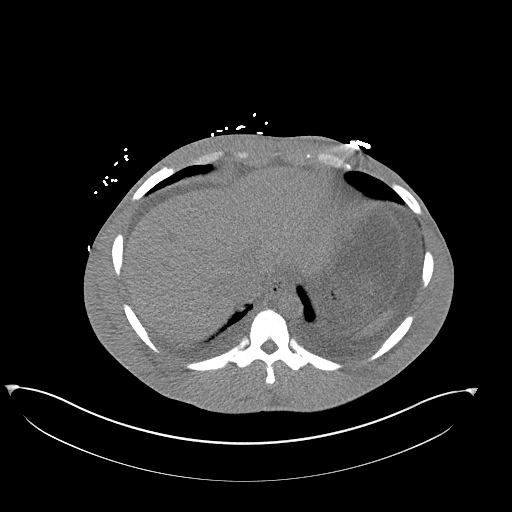
[im 97/103  soft-tissue]
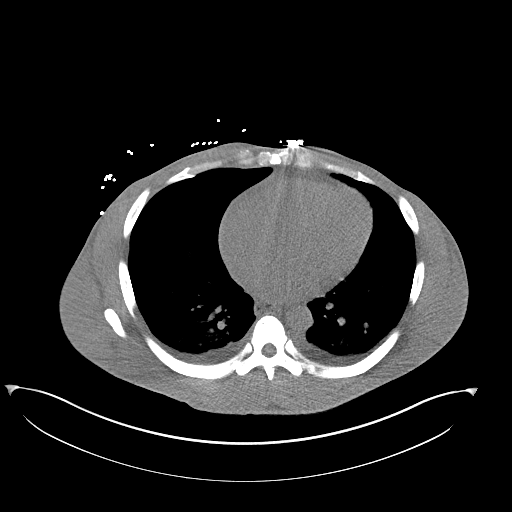

[Series 5: coronal st · coronal · 0.82mm/px · 3 of 165 slices shown]
[im 55/165  soft-tissue]
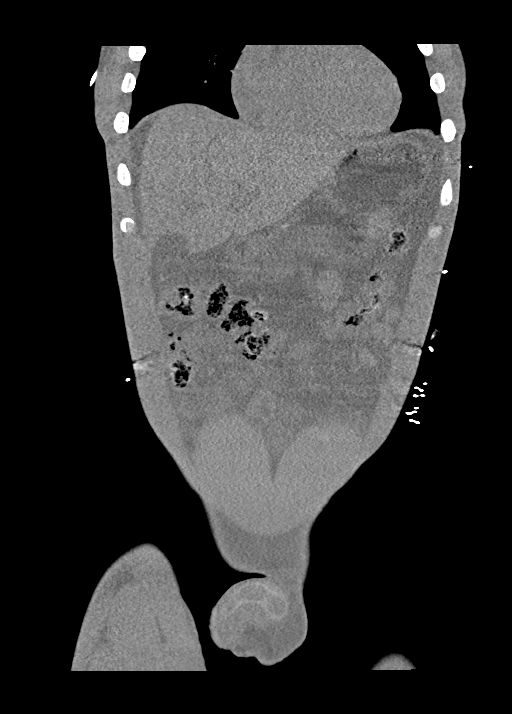
[im 73/165  soft-tissue]
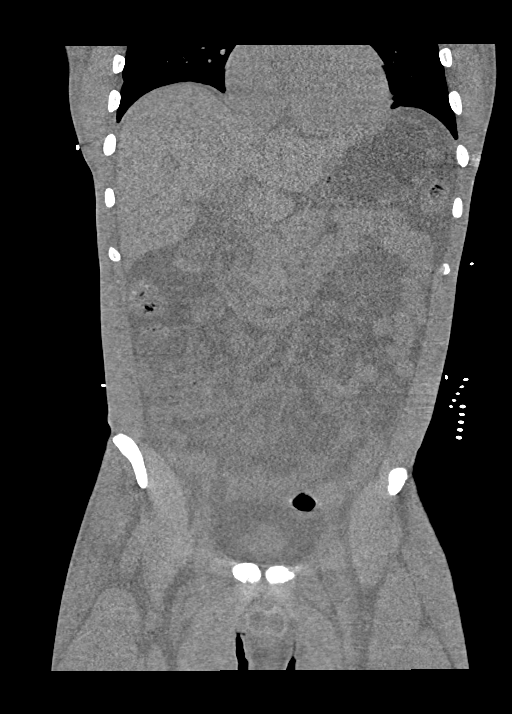
[im 92/165  soft-tissue]
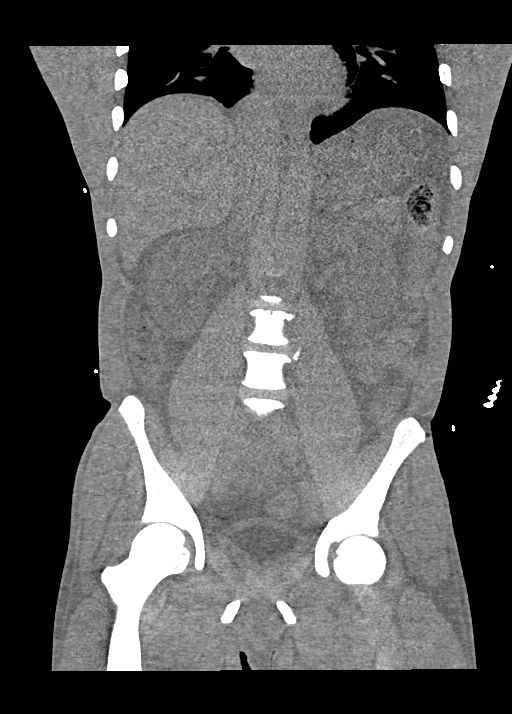

[16 of 46 positions shown; findings below may reference images not displayed]

FINDINGS: Lower chest: Small bilateral pleural effusions and mild dependent
atelectasis.

Hepatobiliary: Liver is within normal limits. Gallbladder is well
distended with dependent density likely related to sludge.
Pericholecystic fluid is noted although ascites is seen.

Pancreas: Unremarkable. No pancreatic ductal dilatation or
surrounding inflammatory changes.

Spleen: Normal in size without focal abnormality.

Adrenals/Urinary Tract: Adrenal glands are within normal limits.
Kidneys show no renal calculi or definitive obstructive changes.
Bladder is partially distended.

Stomach/Bowel: Colon shows no obstructive or inflammatory changes.
The appendix is not well appreciated although no inflammatory
changes are seen.

Vascular/Lymphatic: No significant vascular findings are present. No
enlarged abdominal or pelvic lymph nodes.

Reproductive: Prostate is unremarkable.

Other: Mild diffuse ascites is noted throughout the abdomen. Some
generalized edema within the mesenteric fat is noted as well likely
related to third spacing.

Musculoskeletal: No acute or significant osseous findings.
IMPRESSION: Small effusions and dependent atelectatic changes without focal
confluent infiltrate.

Mild ascites with reactive changes in the abdomen and pelvis.
Changes are likely related to the patient's known chronic congestive
failure.

Gallbladder is well distended with pericholecystic fluid likely
related to the underlying ascites. The need for further evaluation
can be determined on a clinical basis.

## 2022-11-06 IMAGING — US US BIOPSY
1 series · 12 of 12 positions shown · non-contrast
Comparison: none

INDICATION: Acute kidney injury

[Series 1: us biopsy (kidney) · 12 of 12 slices shown]
[im 1/12]
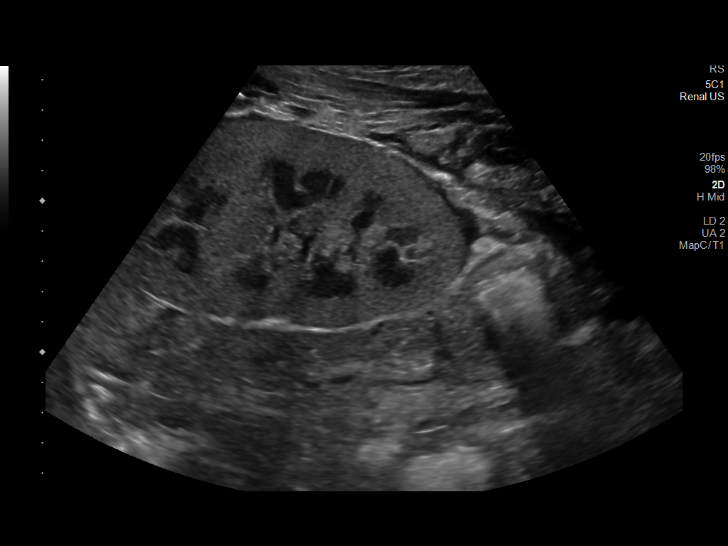
[im 2/12]
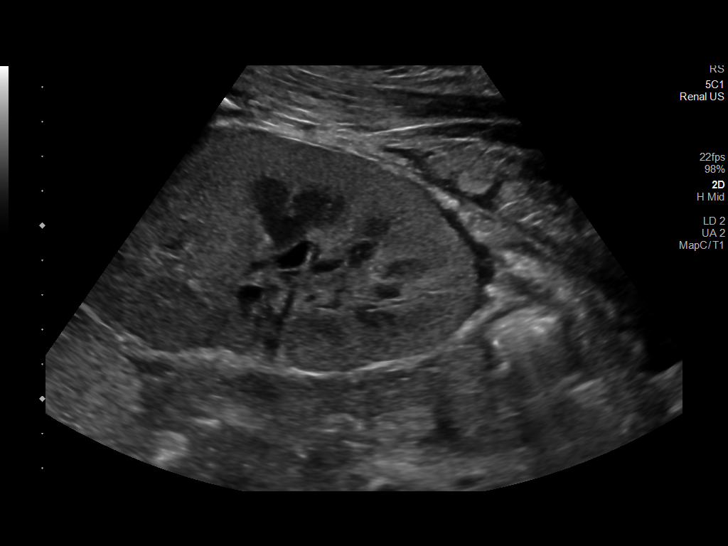
[im 3/12]
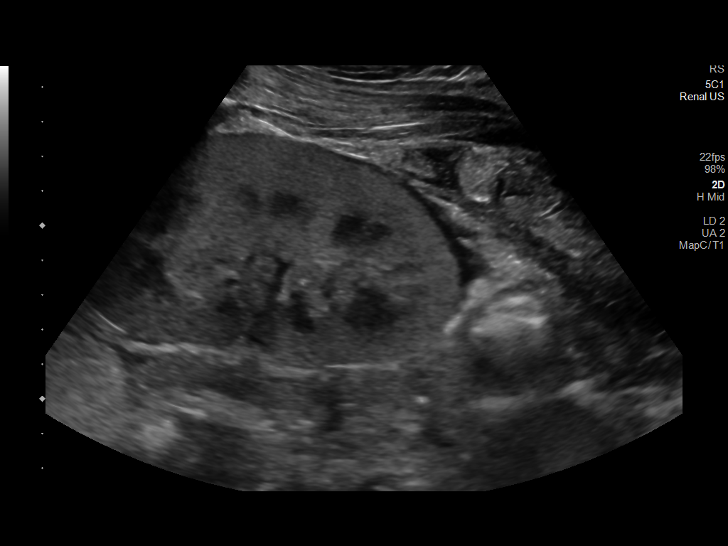
[im 4/12]
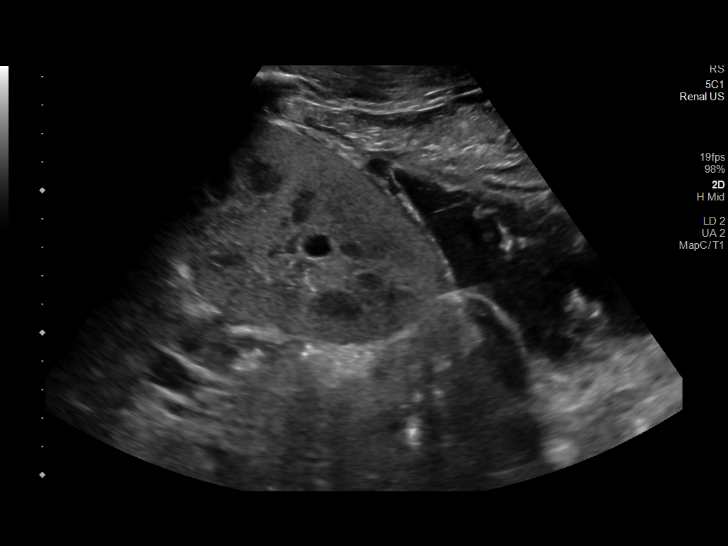
[im 5/12]
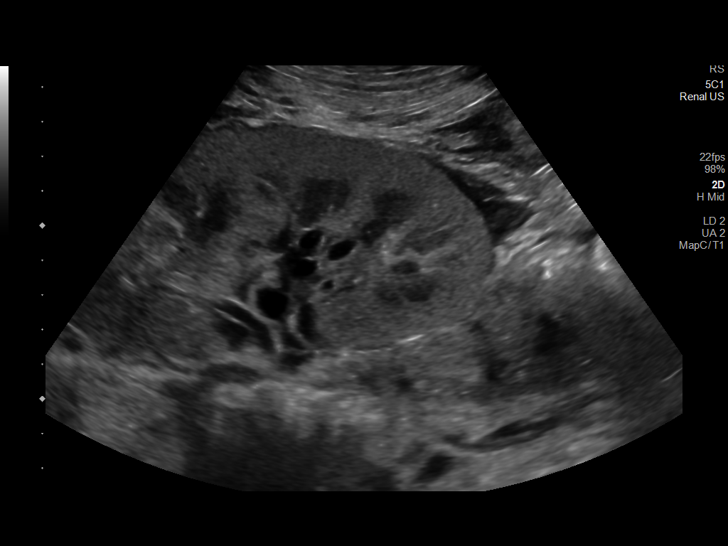
[im 6/12]
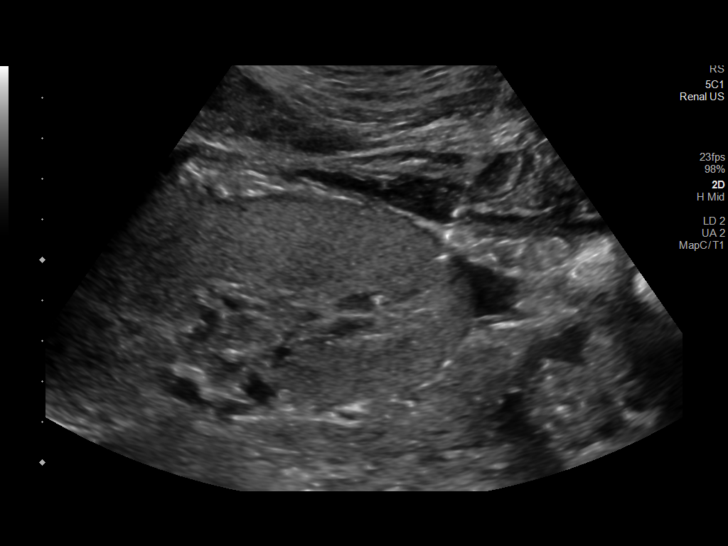
[im 7/12]
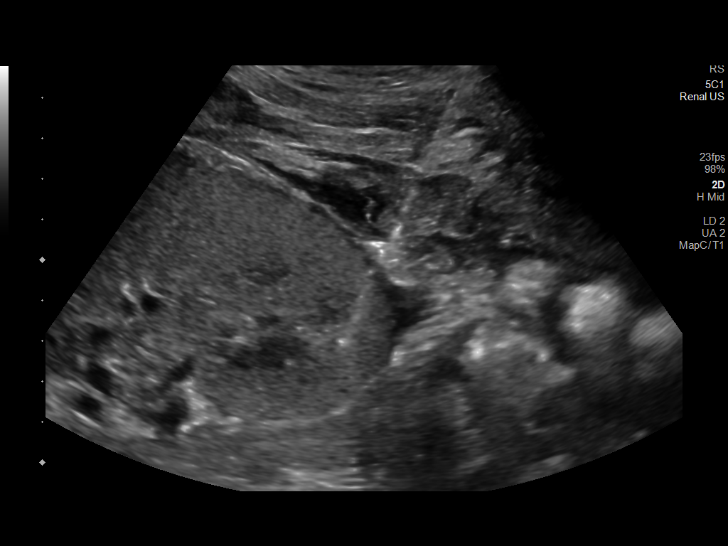
[im 8/12]
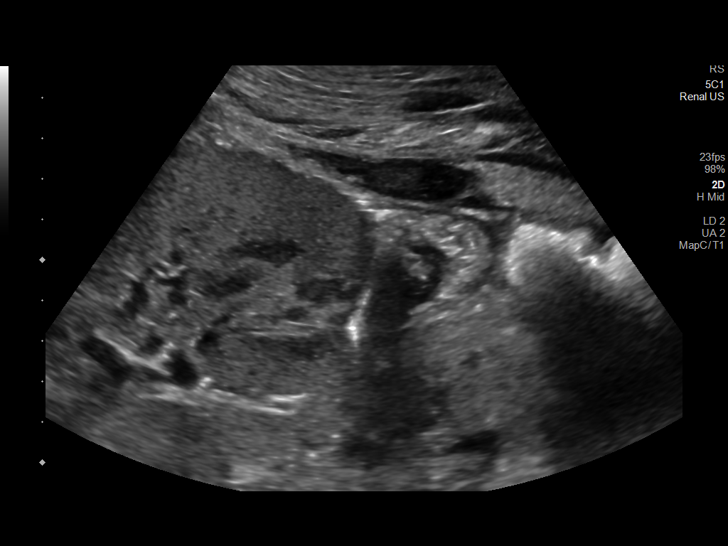
[im 9/12]
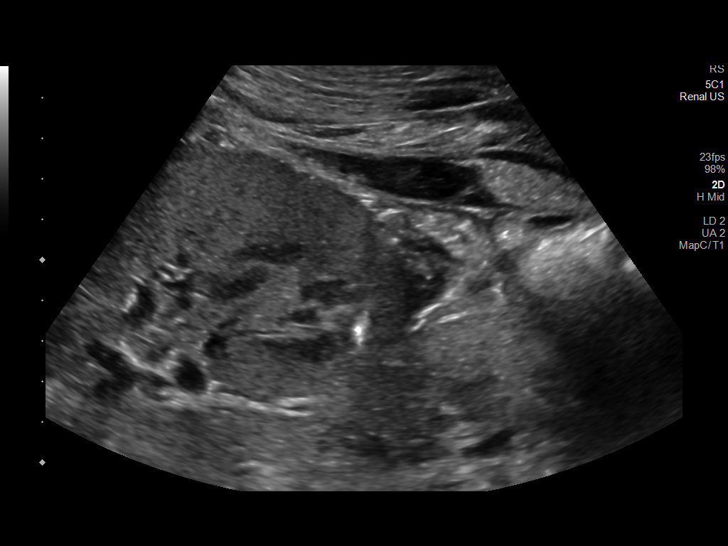
[im 10/12]
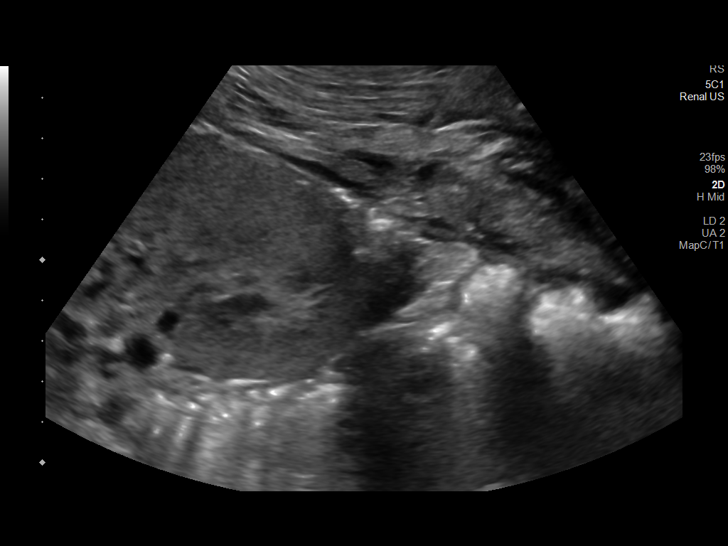
[im 11/12]
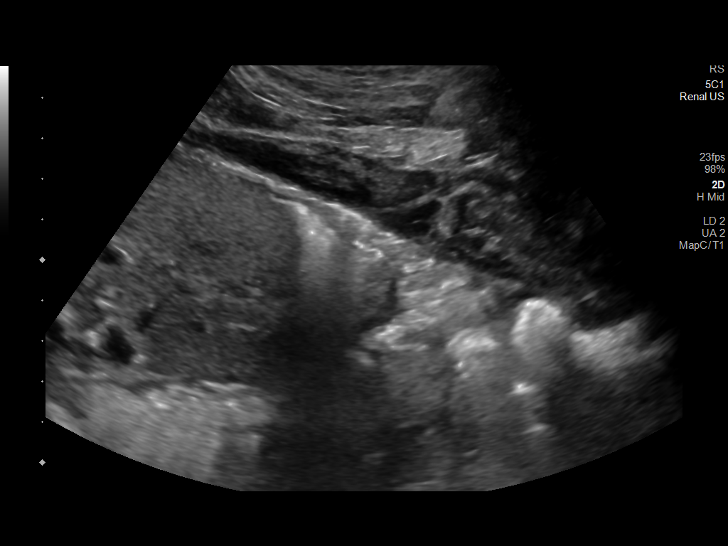
[im 12/12]
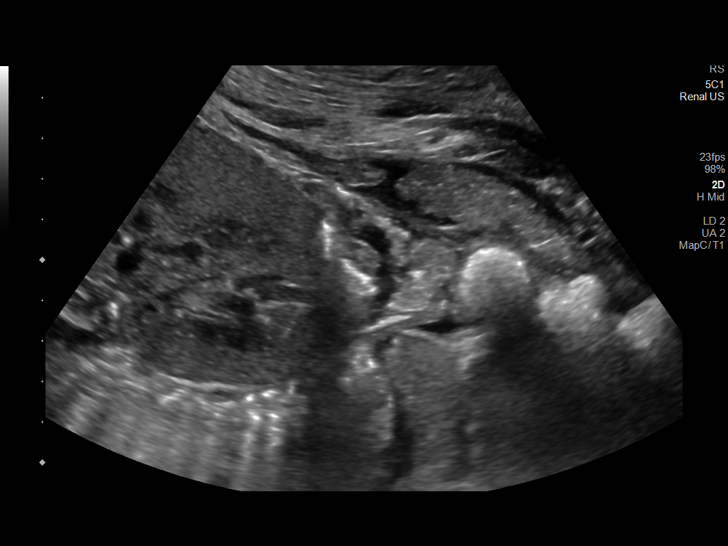

[12 of 12 positions shown; findings below may reference images not displayed]

EXAM:
ULTRASOUND RANDOM LEFT RENAL CORE BIOPSY

MEDICATIONS:
1% lidocaine local

ANESTHESIA/SEDATION:
Moderate (conscious) sedation was employed during this procedure. A
total of Versed 2.0 mg and Fentanyl 75 mcg was administered
intravenously.

Moderate Sedation Time: 14 minutes. The patient's level of
consciousness and vital signs were monitored continuously by
radiology nursing throughout the procedure under my direct
supervision.

FLUOROSCOPY TIME:  Fluoroscopy Time: None.

COMPLICATIONS:
None immediate.

PROCEDURE:
Informed written consent was obtained from the patient after a
thorough discussion of the procedural risks, benefits and
alternatives. All questions were addressed. Maximal Sterile Barrier
Technique was utilized including caps, mask, sterile gowns, sterile
gloves, sterile drape, hand hygiene and skin antiseptic. A timeout
was performed prior to the initiation of the procedure.

Previous imaging reviewed. Preliminary ultrasound performed. The
left kidney lower pole was localized and marked for biopsy. Kidneys
are echogenic compatible with medical renal disease. Diffuse soft
tissue edema noted as well as trace perinephric free fluid.

Under sterile conditions and local anesthesia, a 15 gauge coaxial
guide was advanced to the left kidney lower pole. Needle position
confirmed with ultrasound. Images obtained for documentation. 2 16
gauge core biopsies obtained. Samples were intact and non
fragmented. These were placed in saline. Needle tract occluded with
Gel-Foam. Postprocedure imaging demonstrates no hemorrhage or
hematoma. Patient tolerated biopsy well.
IMPRESSION: Successful ultrasound left kidney 16 gauge core biopsy

## 2022-11-22 ENCOUNTER — Ambulatory Visit
Admission: EM | Admit: 2022-11-22 | Discharge: 2022-11-22 | Disposition: A | Discharge status: Home / Self Care | Payer: Self-pay | Attending: Nurse Practitioner | Admitting: Nurse Practitioner

## 2022-11-22 DIAGNOSIS — H01119 Allergic dermatitis of unspecified eye, unspecified eyelid: Secondary | ICD-10-CM

## 2022-11-22 MED ORDER — HYDROCORTISONE 2.5 % EX LOTN: TOPICAL_LOTION | Freq: Two times a day (BID) | CUTANEOUS | 0 refills | Status: AC

## 2022-11-22 MED ORDER — PREDNISONE 20 MG PO TABS: 40.0000 mg | ORAL_TABLET | Freq: Every day | ORAL | 0 refills | Status: AC

## 2022-11-22 NOTE — Discharge Instructions (Signed)
Prednisone daily for 5 days Hydrocortisone cream to upper and lower eyelids twice daily.  Please use sparingly and for no more than 5 days Get over-the-counter Aquaphor lotion and apply as often as needed for hydration and soothing of the affected area Follow-up with your PCP in 2 to 3 days for recheck Please go to the emergency room if you have any worsening symptoms

## 2022-11-22 NOTE — ED Provider Notes (Signed)
[facility]    CSN: [id number] Arrival date & time: 11/22/22  0814      History   Chief Complaint Chief Complaint  Patient presents with   Eye Problem    HPI Evan Vargas is a 42 y.o. male presents for evaluation of bilateral eye swelling.  Patient states he used a different soap recently.  He has had reactions to various soaps in the past.  He states this caused him to develop a rash around his eyes.  He did use alcohol and peroxide to "dried out".  Since then he has been having waxing and waning swelling with dry irritated skin on his upper and lower eyelids.  No drainage, fevers, chills.  Denies history of eczema or psoriasis.  Denies any corneal redness or drainage.  He has not used any OTC medications to the area since onset.  No other concerns at this time.   Eye Problem   Past Medical History:  Diagnosis Date   Paroxysmal atrial fibrillation ([place])    dx'ed in 2019    Patient Active Problem List   Diagnosis Date Noted   Acute renal failure (ARF) ([place]) 01/31/2021   Hyperkalemia 01/31/2021   High anion gap metabolic acidosis 99991111   Nausea & vomiting 01/31/2021   Diarrhea 01/31/2021   Transaminitis 01/31/2021   Hyperbilirubinemia 01/31/2021   Paroxysmal atrial fibrillation (HCC)    Acute diastolic CHF (congestive heart failure) ([place]) 01/03/2018   Acute respiratory failure with hypoxia ([place]) 01/03/2018   Tachycardia 01/02/2018   Community acquired pneumonia 01/02/2018    Past Surgical History:  Procedure Laterality Date   ANTERIOR CRUCIATE LIGAMENT REPAIR     left   ANTERIOR CRUCIATE LIGAMENT REPAIR  2004   IR PERC TUN PERIT CATH WO PORT S&I /IMAG  02/01/2021   IR REMOVAL TUN CV CATH W/O FL  02/14/2021       Home Medications    Prior to Admission medications   Medication Sig Start Date End Date Taking? Authorizing Provider  hydrocortisone 2.5 % lotion Apply topically 2 (two) times daily. Apply sparingly to upper and lower eyelids two times  a day 11/22/22  Yes Melynda Ripple, NP  predniSONE (DELTASONE) 20 MG tablet Take 2 tablets (40 mg total) by mouth daily with breakfast for 5 days. 11/22/22 11/27/22 Yes Melynda Ripple, NP  Multiple Vitamins-Minerals (ZINC PO) Take 1 tablet by mouth daily.    [provider]    Family History Family History  Problem Relation Age of Onset   Heart disease Maternal Grandmother     Social History Social History   Tobacco Use   Smoking status: Never   Smokeless tobacco: Never  Substance Use Topics   Alcohol use: No   Drug use: No     Allergies   Patient has no known allergies.   Review of Systems Review of Systems  Eyes:        Bilateral eyelid swelling     Physical Exam Triage Vital Signs ED Triage Vitals  Enc Vitals Group     BP 11/22/22 0928 135/81     Pulse Rate 11/22/22 0928 69     Resp 11/22/22 0928 18     Temp 11/22/22 0928 98.3 F (36.8 C)     Temp Source 11/22/22 0928 Oral     SpO2 11/22/22 0928 98 %     Weight --      Height --      Head Circumference --  Peak Flow --      Pain Score 11/22/22 0927 1     Pain Loc --      Pain Edu? --      Excl. in [place]? --    No data found.  Updated Vital Signs BP 135/81 (BP Location: Left Arm)   Pulse 69   Temp 98.3 F (36.8 C) (Oral)   Resp 18   SpO2 98%   Visual Acuity Right Eye Distance:   Left Eye Distance:   Bilateral Distance:    Right Eye Near:   Left Eye Near:    Bilateral Near:     Physical Exam Vitals and nursing note reviewed.  Constitutional:      Appearance: Normal appearance.  HENT:     Head: Normocephalic and atraumatic.  Eyes:     General:        Right eye: No discharge.        Left eye: No discharge.     Conjunctiva/sclera:     Right eye: Right conjunctiva is not injected.     Left eye: Left conjunctiva is not injected.     Pupils: Pupils are equal, round, and reactive to light.     Comments: There is mild swelling and erythema to the upper and lower eyelids with dry  irritated skin.  No drainage or warmth.  Cardiovascular:     Rate and Rhythm: Normal rate.  Pulmonary:     Effort: Pulmonary effort is normal.  Skin:    General: Skin is warm and dry.  Neurological:     General: No focal deficit present.     Mental Status: He is alert and oriented to person, place, and time.  Psychiatric:        Mood and Affect: Mood normal.        Behavior: Behavior normal.      UC Treatments / Results  Labs (all labs ordered are listed, but only abnormal results are displayed) Labs Reviewed - No data to display  EKG   Radiology No results found.  Procedures Procedures (including critical care time)  Medications Ordered in UC Medications - No data to display  Initial Impression / Assessment and Plan / UC Course  I have reviewed the triage vital signs and the nursing notes.  Pertinent labs & imaging results that were available during my care of the patient were reviewed by me and considered in my medical decision making (see chart for details).     Reviewed exam and symptoms with patient.  Discussed exam consistent with eyelid dermatitis Prednisone for 5 days Topical hydrocortisone twice daily for 5 days.  Patient instructed to use sparingly and no longer than 5 days Over-the-counter Aquaphor lotion to affected area for hydration  Patient to follow-up with PCP in 2 to 3 days for recheck ER precautions reviewed and patient verbalized understanding Final Clinical Impressions(s) / UC Diagnoses   Final diagnoses:  Eyelid dermatitis, allergic/contact     Discharge Instructions      Prednisone daily for 5 days Hydrocortisone cream to upper and lower eyelids twice daily.  Please use sparingly and for no more than 5 days Get over-the-counter Aquaphor lotion and apply as often as needed for hydration and soothing of the affected area Follow-up with your PCP in 2 to 3 days for recheck Please go to the emergency room if you have any worsening  symptoms   ED Prescriptions     Medication Sig Dispense Auth. Provider   predniSONE (DELTASONE)  20 MG tablet Take 2 tablets (40 mg total) by mouth daily with breakfast for 5 days. 10 tablet Melynda Ripple, NP   hydrocortisone 2.5 % lotion Apply topically 2 (two) times daily. Apply sparingly to upper and lower eyelids two times a day 59 mL Melynda Ripple, NP      PDMP not reviewed this encounter.   Melynda Ripple, NP 11/22/22 [phone]

## 2022-11-22 NOTE — ED Triage Notes (Signed)
Pt reports using alcohol and peroxide on his face and since then he began having discomfort to his eyes and swelling around the eyes. The patient denies vision changes.   Started: a little over a week ago
